# Patient Record
Sex: Female | Born: 1965
Health system: Southern US, Community
[De-identification: ages and names within clinical notes are randomized; demographics above are authoritative.]

## PROBLEM LIST (undated history)

## (undated) DIAGNOSIS — G4700 Insomnia, unspecified: Secondary | ICD-10-CM

## (undated) DIAGNOSIS — I509 Heart failure, unspecified: Secondary | ICD-10-CM

## (undated) DIAGNOSIS — M419 Scoliosis, unspecified: Secondary | ICD-10-CM

## (undated) DIAGNOSIS — F32A Depression, unspecified: Secondary | ICD-10-CM

## (undated) DIAGNOSIS — F419 Anxiety disorder, unspecified: Secondary | ICD-10-CM

## (undated) DIAGNOSIS — E785 Hyperlipidemia, unspecified: Secondary | ICD-10-CM

## (undated) DIAGNOSIS — E039 Hypothyroidism, unspecified: Secondary | ICD-10-CM

## (undated) HISTORY — PX: CARDIAC CATHETERIZATION: SHX172

## (undated) HISTORY — PX: ABDOMINAL HYSTERECTOMY: SHX81

## (undated) HISTORY — PX: PARTIAL HYSTERECTOMY: SHX80

## (undated) HISTORY — PX: CHOLECYSTECTOMY: SHX55

---

## 2020-02-05 ENCOUNTER — Emergency Department (HOSPITAL_COMMUNITY): Payer: BC Managed Care – PPO

## 2020-02-05 ENCOUNTER — Inpatient Hospital Stay (HOSPITAL_COMMUNITY)
Admission: EM | Disposition: A | Discharge status: Home / Self Care | Payer: Self-pay | Source: Home / Self Care | Attending: Pulmonary Disease

## 2020-02-05 ENCOUNTER — Inpatient Hospital Stay (HOSPITAL_COMMUNITY)
Admission: EM | Admit: 2020-02-05 | Discharge: 2020-02-15 | DRG: 224 | Disposition: A | Discharge status: Home / Self Care | Payer: BC Managed Care – PPO | Attending: Family Medicine | Admitting: Family Medicine

## 2020-02-05 ENCOUNTER — Encounter (HOSPITAL_COMMUNITY): Payer: Self-pay | Admitting: Pulmonary Disease

## 2020-02-05 DIAGNOSIS — K219 Gastro-esophageal reflux disease without esophagitis: Secondary | ICD-10-CM | POA: Diagnosis present

## 2020-02-05 DIAGNOSIS — I4901 Ventricular fibrillation: Secondary | ICD-10-CM | POA: Diagnosis present

## 2020-02-05 DIAGNOSIS — E785 Hyperlipidemia, unspecified: Secondary | ICD-10-CM | POA: Diagnosis present

## 2020-02-05 DIAGNOSIS — F419 Anxiety disorder, unspecified: Secondary | ICD-10-CM | POA: Diagnosis present

## 2020-02-05 DIAGNOSIS — H9190 Unspecified hearing loss, unspecified ear: Secondary | ICD-10-CM | POA: Diagnosis present

## 2020-02-05 DIAGNOSIS — I428 Other cardiomyopathies: Secondary | ICD-10-CM | POA: Diagnosis not present

## 2020-02-05 DIAGNOSIS — Z20822 Contact with and (suspected) exposure to covid-19: Secondary | ICD-10-CM | POA: Diagnosis present

## 2020-02-05 DIAGNOSIS — E039 Hypothyroidism, unspecified: Secondary | ICD-10-CM | POA: Diagnosis present

## 2020-02-05 DIAGNOSIS — D649 Anemia, unspecified: Secondary | ICD-10-CM | POA: Diagnosis present

## 2020-02-05 DIAGNOSIS — R570 Cardiogenic shock: Secondary | ICD-10-CM | POA: Diagnosis not present

## 2020-02-05 DIAGNOSIS — I493 Ventricular premature depolarization: Secondary | ICD-10-CM | POA: Diagnosis present

## 2020-02-05 DIAGNOSIS — I5021 Acute systolic (congestive) heart failure: Secondary | ICD-10-CM

## 2020-02-05 DIAGNOSIS — I214 Non-ST elevation (NSTEMI) myocardial infarction: Secondary | ICD-10-CM

## 2020-02-05 DIAGNOSIS — J9690 Respiratory failure, unspecified, unspecified whether with hypoxia or hypercapnia: Secondary | ICD-10-CM

## 2020-02-05 DIAGNOSIS — I509 Heart failure, unspecified: Secondary | ICD-10-CM

## 2020-02-05 DIAGNOSIS — Z23 Encounter for immunization: Secondary | ICD-10-CM

## 2020-02-05 DIAGNOSIS — I499 Cardiac arrhythmia, unspecified: Secondary | ICD-10-CM | POA: Diagnosis present

## 2020-02-05 DIAGNOSIS — I5181 Takotsubo syndrome: Secondary | ICD-10-CM | POA: Diagnosis not present

## 2020-02-05 DIAGNOSIS — G4700 Insomnia, unspecified: Secondary | ICD-10-CM | POA: Diagnosis present

## 2020-02-05 DIAGNOSIS — E872 Acidosis: Secondary | ICD-10-CM | POA: Diagnosis present

## 2020-02-05 DIAGNOSIS — I469 Cardiac arrest, cause unspecified: Secondary | ICD-10-CM | POA: Diagnosis present

## 2020-02-05 DIAGNOSIS — F32A Depression, unspecified: Secondary | ICD-10-CM | POA: Diagnosis present

## 2020-02-05 DIAGNOSIS — I472 Ventricular tachycardia: Secondary | ICD-10-CM | POA: Diagnosis not present

## 2020-02-05 DIAGNOSIS — Z79899 Other long term (current) drug therapy: Secondary | ICD-10-CM | POA: Diagnosis not present

## 2020-02-05 DIAGNOSIS — Z955 Presence of coronary angioplasty implant and graft: Secondary | ICD-10-CM

## 2020-02-05 DIAGNOSIS — J9601 Acute respiratory failure with hypoxia: Secondary | ICD-10-CM | POA: Diagnosis present

## 2020-02-05 DIAGNOSIS — I462 Cardiac arrest due to underlying cardiac condition: Secondary | ICD-10-CM | POA: Diagnosis present

## 2020-02-05 DIAGNOSIS — Z820 Family history of epilepsy and other diseases of the nervous system: Secondary | ICD-10-CM | POA: Diagnosis not present

## 2020-02-05 DIAGNOSIS — D696 Thrombocytopenia, unspecified: Secondary | ICD-10-CM | POA: Diagnosis present

## 2020-02-05 DIAGNOSIS — J189 Pneumonia, unspecified organism: Secondary | ICD-10-CM | POA: Diagnosis present

## 2020-02-05 DIAGNOSIS — Z95810 Presence of automatic (implantable) cardiac defibrillator: Secondary | ICD-10-CM

## 2020-02-05 DIAGNOSIS — G9349 Other encephalopathy: Secondary | ICD-10-CM | POA: Diagnosis present

## 2020-02-05 DIAGNOSIS — R1310 Dysphagia, unspecified: Secondary | ICD-10-CM | POA: Diagnosis present

## 2020-02-05 HISTORY — PX: TEMPORARY PACEMAKER: CATH118268

## 2020-02-05 HISTORY — DX: Hypothyroidism, unspecified: E03.9

## 2020-02-05 HISTORY — DX: Anxiety disorder, unspecified: F41.9

## 2020-02-05 HISTORY — DX: Hyperlipidemia, unspecified: E78.5

## 2020-02-05 HISTORY — PX: LEFT HEART CATH AND CORONARY ANGIOGRAPHY: CATH118249

## 2020-02-05 HISTORY — DX: Depression, unspecified: F32.A

## 2020-02-05 HISTORY — DX: Insomnia, unspecified: G47.00

## 2020-02-05 LAB — URINALYSIS, ROUTINE W REFLEX MICROSCOPIC
Bilirubin Urine: NEGATIVE
Glucose, UA: 50 mg/dL — AB
Ketones, ur: NEGATIVE mg/dL
Leukocytes,Ua: NEGATIVE
Nitrite: NEGATIVE
Protein, ur: >=300 mg/dL — AB
Specific Gravity, Urine: 1.013 (ref 1.005–1.030)
pH: 6 (ref 5.0–8.0)

## 2020-02-05 LAB — I-STAT ARTERIAL BLOOD GAS, ED
Acid-base deficit: 1 mmol/L (ref 0.0–2.0)
Bicarbonate: 25.7 mmol/L (ref 20.0–28.0)
Calcium, Ion: 1.17 mmol/L (ref 1.15–1.40)
HCT: 35 % — ABNORMAL LOW (ref 36.0–46.0)
Hemoglobin: 11.9 g/dL — ABNORMAL LOW (ref 12.0–15.0)
O2 Saturation: 100 %
Patient temperature: 98.7
Potassium: 3.7 mmol/L (ref 3.5–5.1)
Sodium: 141 mmol/L (ref 135–145)
TCO2: 27 mmol/L (ref 22–32)
pCO2 arterial: 52.4 mmHg — ABNORMAL HIGH (ref 32.0–48.0)
pH, Arterial: 7.298 — ABNORMAL LOW (ref 7.350–7.450)
pO2, Arterial: 255 mmHg — ABNORMAL HIGH (ref 83.0–108.0)

## 2020-02-05 LAB — PROTIME-INR
INR: 1 (ref 0.8–1.2)
Prothrombin Time: 12.9 seconds (ref 11.4–15.2)

## 2020-02-05 LAB — I-STAT CHEM 8, ED
BUN: 22 mg/dL — ABNORMAL HIGH (ref 6–20)
Calcium, Ion: 1.01 mmol/L — ABNORMAL LOW (ref 1.15–1.40)
Chloride: 105 mmol/L (ref 98–111)
Creatinine, Ser: 1.2 mg/dL — ABNORMAL HIGH (ref 0.44–1.00)
Glucose, Bld: 185 mg/dL — ABNORMAL HIGH (ref 70–99)
HCT: 41 % (ref 36.0–46.0)
Hemoglobin: 13.9 g/dL (ref 12.0–15.0)
Potassium: 3.8 mmol/L (ref 3.5–5.1)
Sodium: 140 mmol/L (ref 135–145)
TCO2: 22 mmol/L (ref 22–32)

## 2020-02-05 LAB — BASIC METABOLIC PANEL
Anion gap: 11 (ref 5–15)
Anion gap: 8 (ref 5–15)
BUN: 18 mg/dL (ref 6–20)
BUN: 16 mg/dL (ref 6–20)
CO2: 21 mmol/L — ABNORMAL LOW (ref 22–32)
CO2: 19 mmol/L — ABNORMAL LOW (ref 22–32)
Calcium: 8.7 mg/dL — ABNORMAL LOW (ref 8.9–10.3)
Calcium: 6.3 mg/dL — CL (ref 8.9–10.3)
Chloride: 107 mmol/L (ref 98–111)
Chloride: 110 mmol/L (ref 98–111)
Creatinine, Ser: 1.18 mg/dL — ABNORMAL HIGH (ref 0.44–1.00)
Creatinine, Ser: 0.83 mg/dL (ref 0.44–1.00)
GFR, Estimated: 56 mL/min — ABNORMAL LOW (ref >60)
GFR, Estimated: >60 mL/min (ref >60)
Glucose, Bld: 182 mg/dL — ABNORMAL HIGH (ref 70–99)
Glucose, Bld: 208 mg/dL — ABNORMAL HIGH (ref 70–99)
Potassium: 3.9 mmol/L (ref 3.5–5.1)
Potassium: 3.9 mmol/L (ref 3.5–5.1)
Sodium: 139 mmol/L (ref 135–145)
Sodium: 137 mmol/L (ref 135–145)

## 2020-02-05 LAB — I-STAT VENOUS BLOOD GAS, ED
Acid-base deficit: 3 mmol/L — ABNORMAL HIGH (ref 0.0–2.0)
Bicarbonate: 23.1 mmol/L (ref 20.0–28.0)
Calcium, Ion: 1.04 mmol/L — ABNORMAL LOW (ref 1.15–1.40)
HCT: 41 % (ref 36.0–46.0)
Hemoglobin: 13.9 g/dL (ref 12.0–15.0)
O2 Saturation: 94 %
Potassium: 3.8 mmol/L (ref 3.5–5.1)
Sodium: 139 mmol/L (ref 135–145)
TCO2: 24 mmol/L (ref 22–32)
pCO2, Ven: 42 mmHg — ABNORMAL LOW (ref 44.0–60.0)
pH, Ven: 7.348 (ref 7.250–7.430)
pO2, Ven: 74 mmHg — ABNORMAL HIGH (ref 32.0–45.0)

## 2020-02-05 LAB — COMPREHENSIVE METABOLIC PANEL
ALT: 482 U/L — ABNORMAL HIGH (ref 0–44)
AST: 898 U/L — ABNORMAL HIGH (ref 15–41)
Albumin: 3.1 g/dL — ABNORMAL LOW (ref 3.5–5.0)
Alkaline Phosphatase: 75 U/L (ref 38–126)
Anion gap: 6 (ref 5–15)
BUN: 16 mg/dL (ref 6–20)
CO2: 22 mmol/L (ref 22–32)
Calcium: 7 mg/dL — ABNORMAL LOW (ref 8.9–10.3)
Chloride: 109 mmol/L (ref 98–111)
Creatinine, Ser: 0.94 mg/dL (ref 0.44–1.00)
GFR, Estimated: >60 mL/min (ref >60)
Glucose, Bld: 199 mg/dL — ABNORMAL HIGH (ref 70–99)
Potassium: 4.9 mmol/L (ref 3.5–5.1)
Sodium: 137 mmol/L (ref 135–145)
Total Bilirubin: 0.8 mg/dL (ref 0.3–1.2)
Total Protein: 5.2 g/dL — ABNORMAL LOW (ref 6.5–8.1)

## 2020-02-05 LAB — RESP PANEL BY RT PCR (RSV, FLU A&B, COVID)
Influenza A by PCR: NEGATIVE
Influenza B by PCR: NEGATIVE
Respiratory Syncytial Virus by PCR: NEGATIVE
SARS Coronavirus 2 by RT PCR: NEGATIVE

## 2020-02-05 LAB — RAPID URINE DRUG SCREEN, HOSP PERFORMED
Amphetamines: POSITIVE — AB
Barbiturates: NOT DETECTED
Benzodiazepines: POSITIVE — AB
Cocaine: NOT DETECTED
Opiates: NOT DETECTED
Tetrahydrocannabinol: NOT DETECTED

## 2020-02-05 LAB — CBC
HCT: 42.7 % (ref 36.0–46.0)
HCT: 39.7 % (ref 36.0–46.0)
Hemoglobin: 13.3 g/dL (ref 12.0–15.0)
Hemoglobin: 12.5 g/dL (ref 12.0–15.0)
MCH: 30.9 pg (ref 26.0–34.0)
MCH: 31.8 pg (ref 26.0–34.0)
MCHC: 31.1 g/dL (ref 30.0–36.0)
MCHC: 31.5 g/dL (ref 30.0–36.0)
MCV: 99.3 fL (ref 80.0–100.0)
MCV: 101 fL — ABNORMAL HIGH (ref 80.0–100.0)
Platelets: 180 10*3/uL (ref 150–400)
Platelets: 203 10*3/uL (ref 150–400)
RBC: 4.3 MIL/uL (ref 3.87–5.11)
RBC: 3.93 MIL/uL (ref 3.87–5.11)
RDW: 13.5 % (ref 11.5–15.5)
RDW: 13.6 % (ref 11.5–15.5)
WBC: 8.1 10*3/uL (ref 4.0–10.5)
WBC: 8.7 10*3/uL (ref 4.0–10.5)
nRBC: 0 % (ref 0.0–0.2)
nRBC: 0 % (ref 0.0–0.2)

## 2020-02-05 LAB — TSH: TSH: 7.724 u[IU]/mL — ABNORMAL HIGH (ref 0.350–4.500)

## 2020-02-05 LAB — I-STAT BETA HCG BLOOD, ED (MC, WL, AP ONLY): I-stat hCG, quantitative: 7.5 m[IU]/mL — ABNORMAL HIGH (ref <5)

## 2020-02-05 LAB — LACTIC ACID, PLASMA
Lactic Acid, Venous: 3.8 mmol/L (ref 0.5–1.9)
Lactic Acid, Venous: 2.4 mmol/L (ref 0.5–1.9)
Lactic Acid, Venous: 1.3 mmol/L (ref 0.5–1.9)

## 2020-02-05 LAB — CBG MONITORING, ED: Glucose-Capillary: 184 mg/dL — ABNORMAL HIGH (ref 70–99)

## 2020-02-05 LAB — TROPONIN I (HIGH SENSITIVITY)
Troponin I (High Sensitivity): 33 ng/L — ABNORMAL HIGH (ref <18)
Troponin I (High Sensitivity): 88 ng/L — ABNORMAL HIGH (ref <18)
Troponin I (High Sensitivity): 119 ng/L (ref <18)
Troponin I (High Sensitivity): 232 ng/L (ref <18)

## 2020-02-05 LAB — HIV ANTIBODY (ROUTINE TESTING W REFLEX): HIV Screen 4th Generation wRfx: NONREACTIVE

## 2020-02-05 LAB — TRIGLYCERIDES: Triglycerides: 59 mg/dL (ref <150)

## 2020-02-05 LAB — D-DIMER, QUANTITATIVE: D-Dimer, Quant: >20 ug/mL-FEU — ABNORMAL HIGH (ref 0.00–0.50)

## 2020-02-05 LAB — MAGNESIUM
Magnesium: 2 mg/dL (ref 1.7–2.4)
Magnesium: 4.4 mg/dL — ABNORMAL HIGH (ref 1.7–2.4)
Magnesium: 3.3 mg/dL — ABNORMAL HIGH (ref 1.7–2.4)

## 2020-02-05 LAB — ETHANOL: Alcohol, Ethyl (B): <10 mg/dL (ref <10)

## 2020-02-05 IMAGING — DX DG CHEST 1V PORT
1 series · 1 of 1 positions shown · non-contrast
Comparison: [DATE] [DATE], [DATE] ([DATE] p.m.)

CLINICAL DATA: Status post CPR.

EXAM:
PORTABLE CHEST 1 VIEW

[chest]
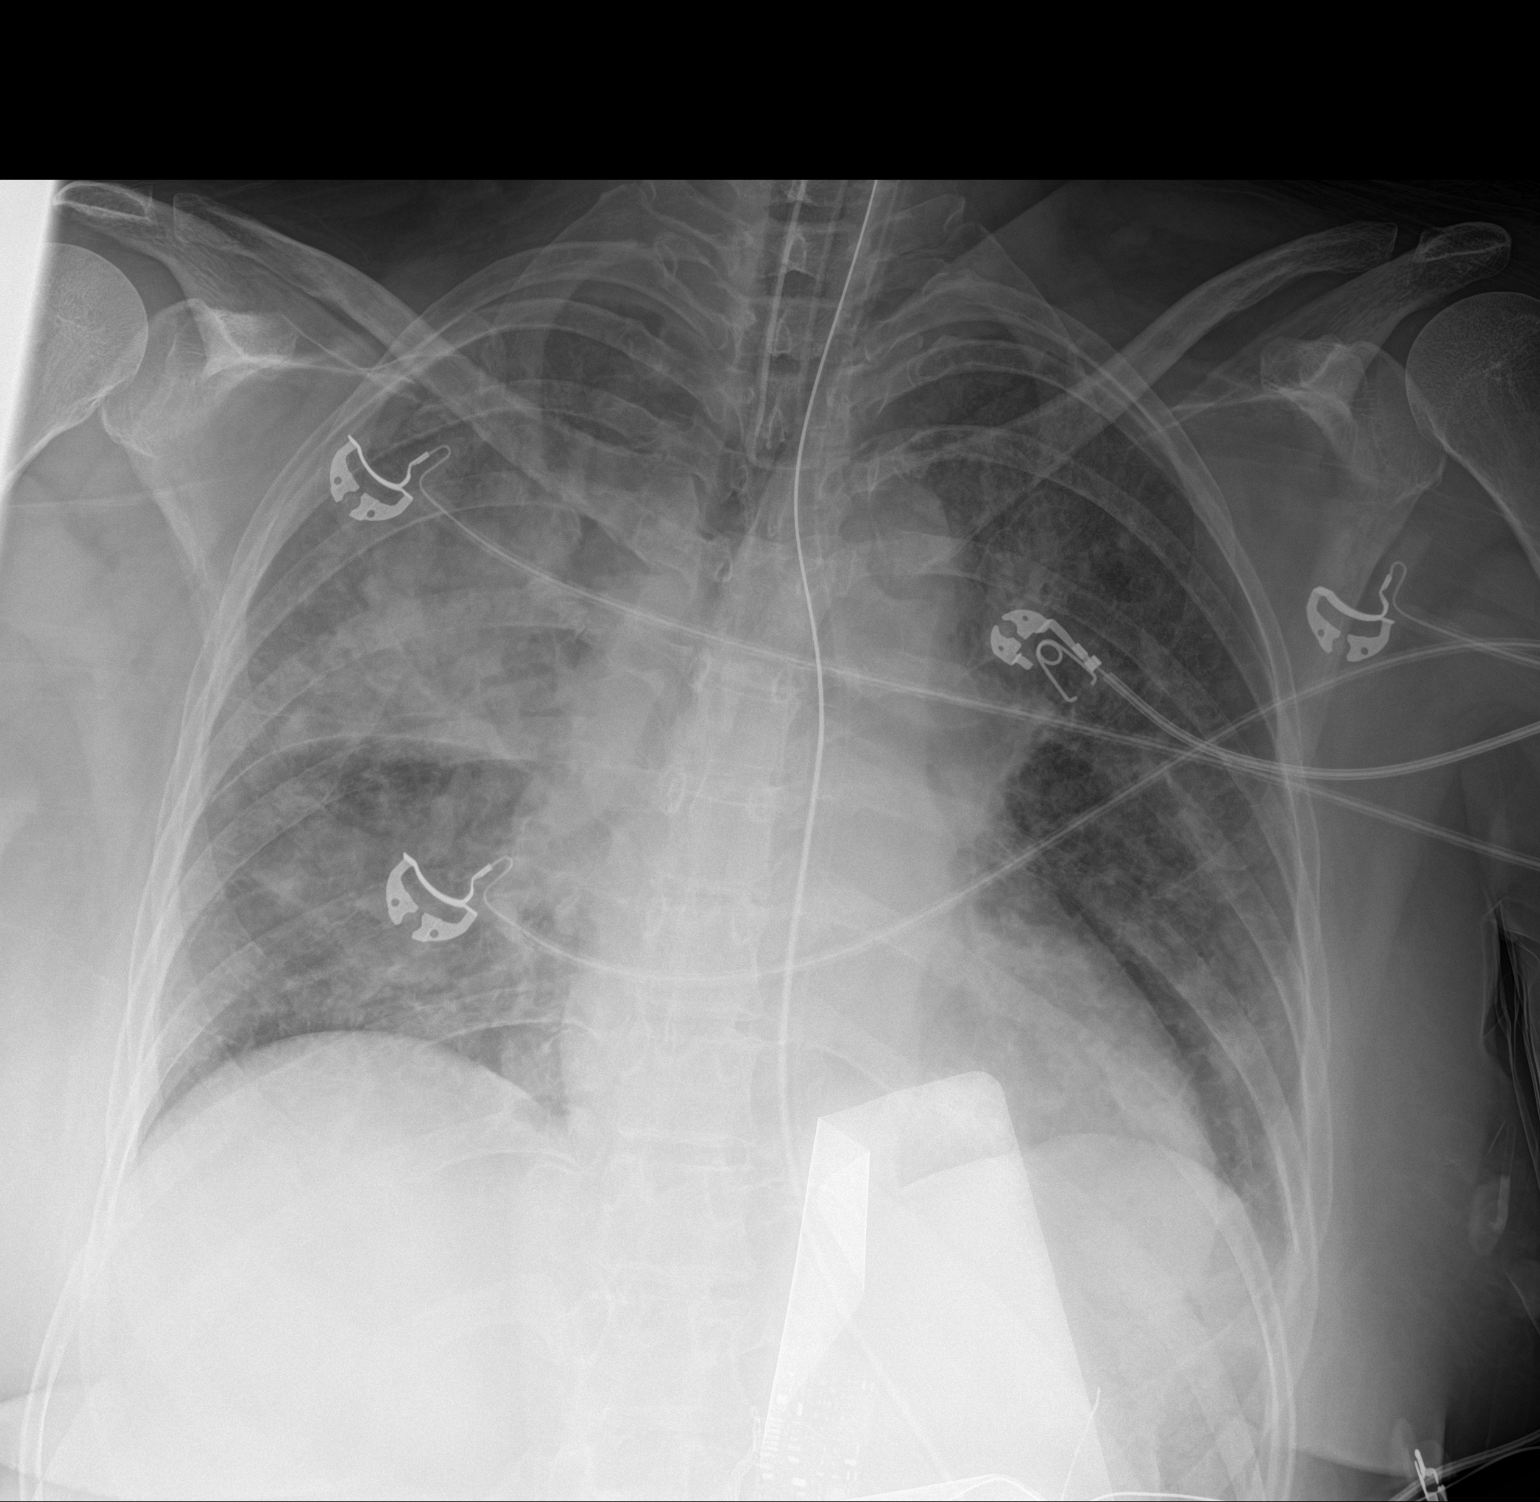

[1 of 1 positions shown; findings below may reference images not displayed]

FINDINGS: There is stable endotracheal tube and nasogastric tube positioning.
Mild diffusely increased interstitial lung markings are seen with
moderate to marked severity patchy left suprahilar and right upper
lobe infiltrates. Mild bilateral lower lobe infiltrates are also
seen. These areas are mildly increased in severity when compared to
the prior study. The heart size and mediastinal contours are within
normal limits. The visualized skeletal structures are unremarkable.
IMPRESSION: Moderate to marked severity bilateral patchy infiltrates, as
described above, with mild interval increase in severity when
compared to the prior exam.

## 2020-02-05 IMAGING — CT CT HEAD W/O CM
4 series · 17 of 47 positions shown, 19 images · non-contrast
Comparison: None.

CLINICAL DATA: 53-year-old female with altered mental status post
cardiac arrest.

EXAM:
CT HEAD WITHOUT CONTRAST
TECHNIQUE: Contiguous axial images were obtained from the base of the skull
through the vertex without intravenous contrast.

[Series 3: head wo · axial · 0.41mm/px · z∈[-154,-34]mm · 7 of 33 slices shown, 9 images]
[im 5/33  brain]
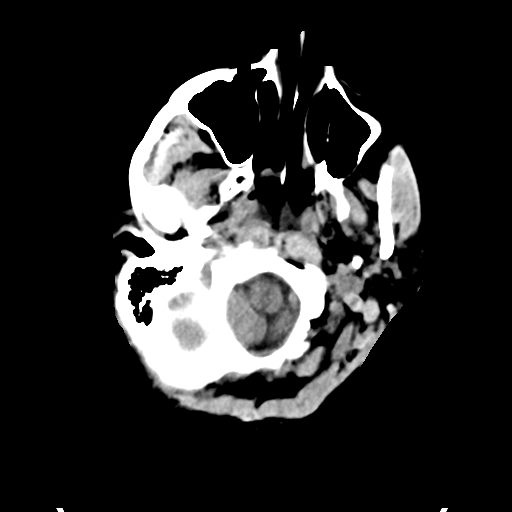
[im 5/33  bone]
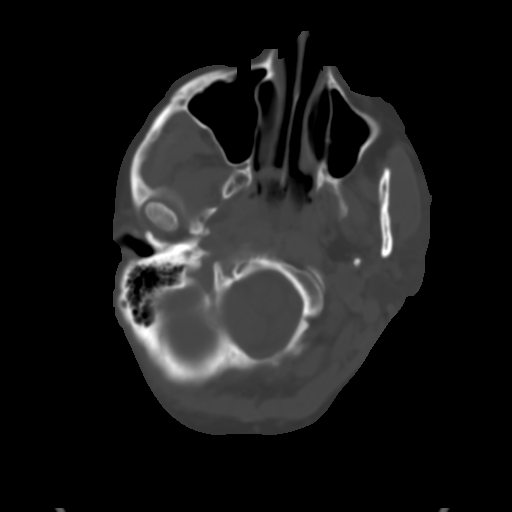
[im 9/33  brain]
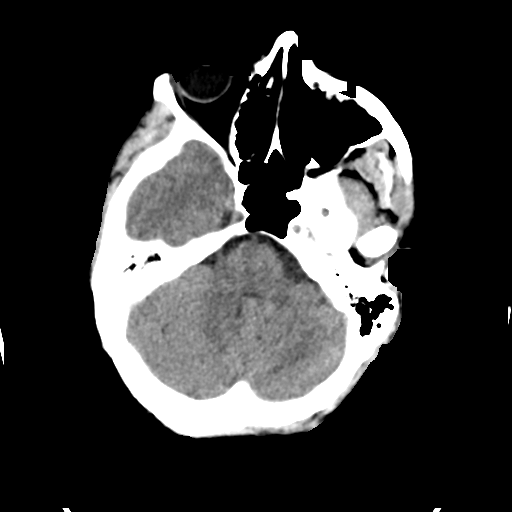
[im 13/33  brain]
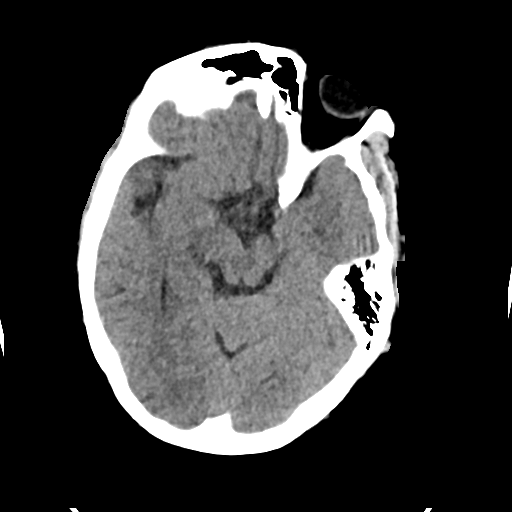
[im 17/33  brain]
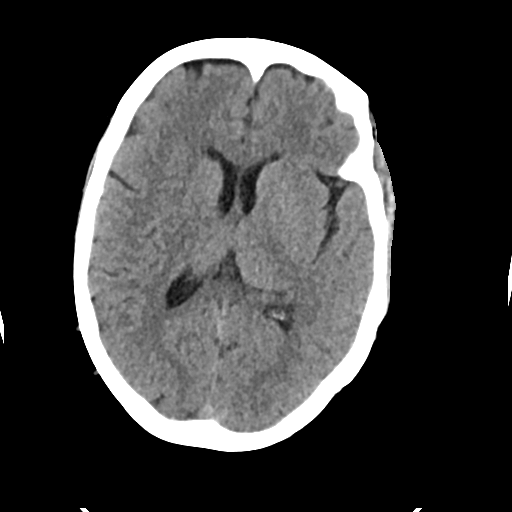
[im 21/33  brain]
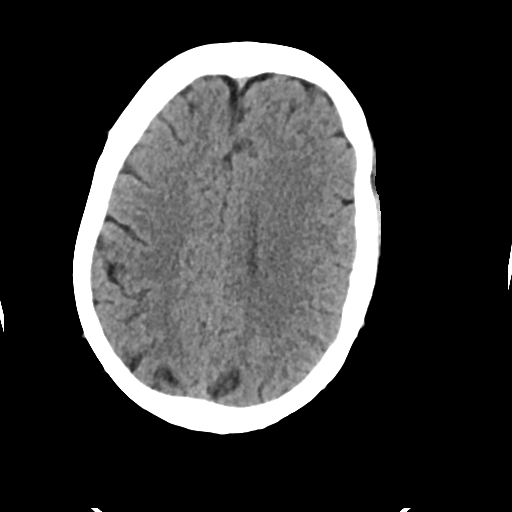
[im 21/33  bone]
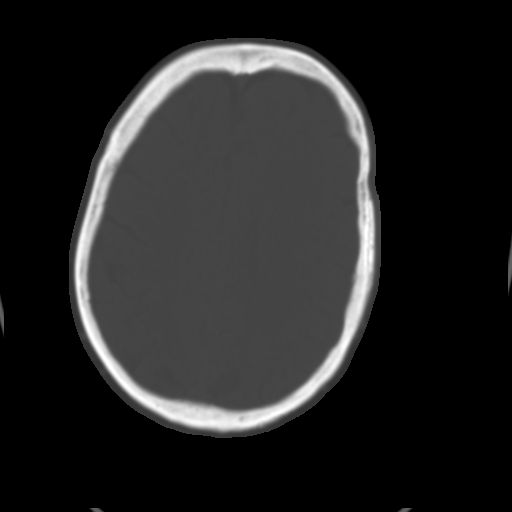
[im 25/33  brain]
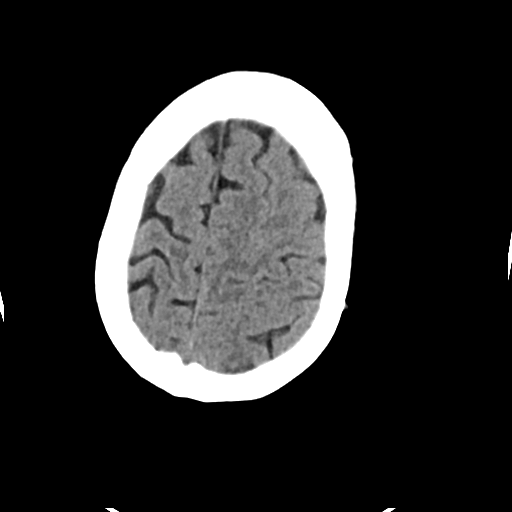
[im 29/33  brain]
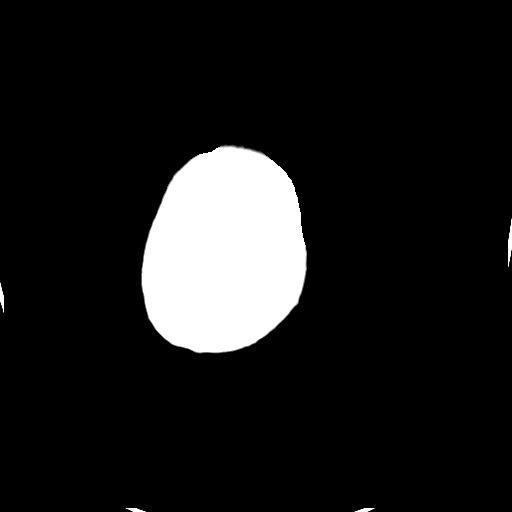

[Series 4: head bone · axial · 0.41mm/px · z∈[-158,-102]mm · 4 of 82 slices shown]
[im 9/82  bone]
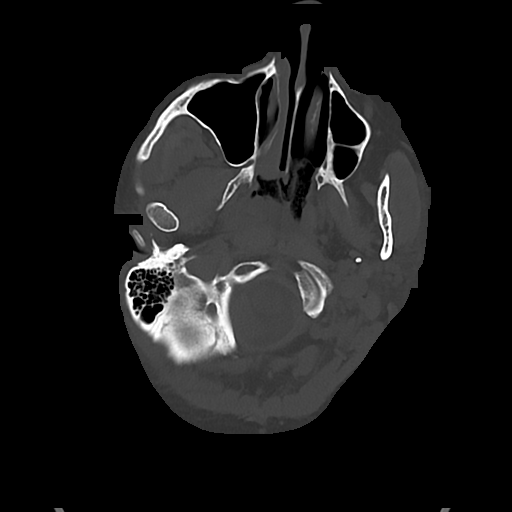
[im 17/82  bone]
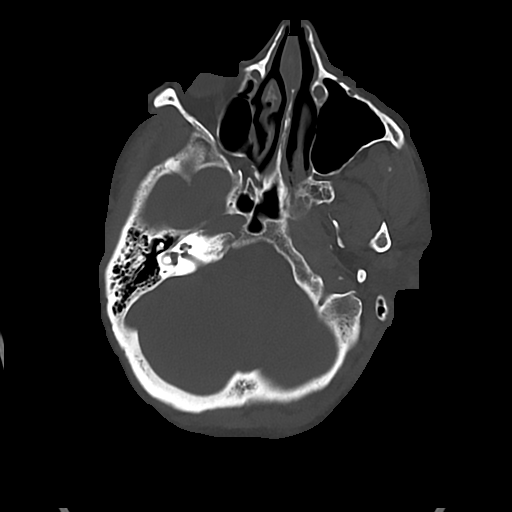
[im 25/82  bone]
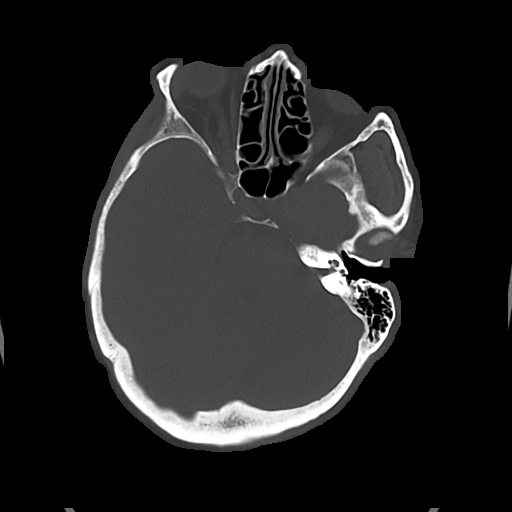
[im 37/82  bone]
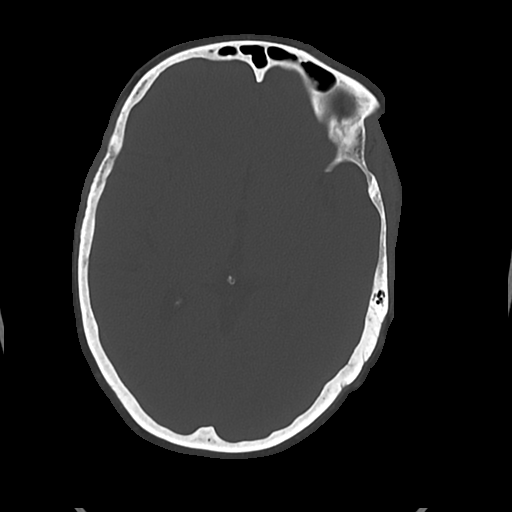

[Series 5: cor soft · coronal · 0.32mm/px · 3 of 67 slices shown]
[im 23/67  brain]
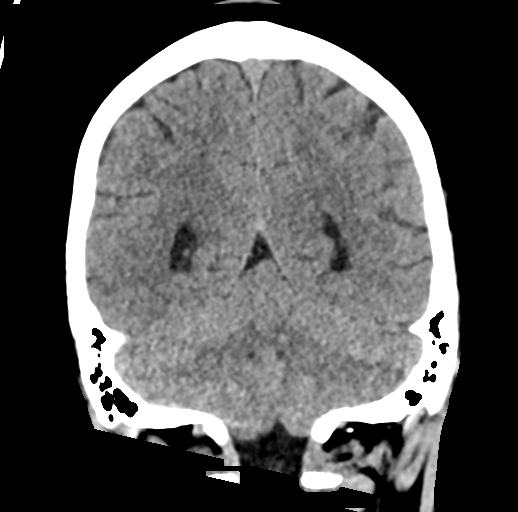
[im 30/67  brain]
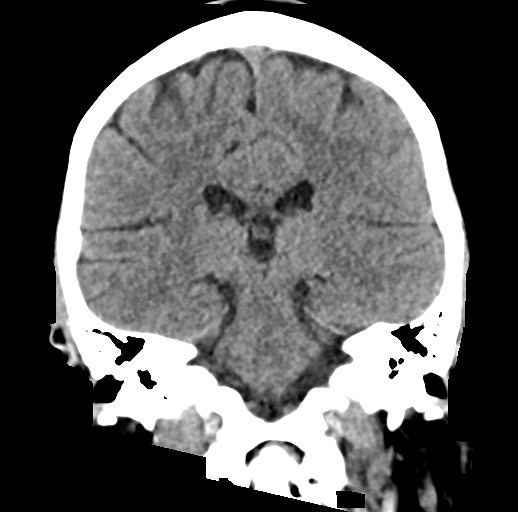
[im 37/67  brain]
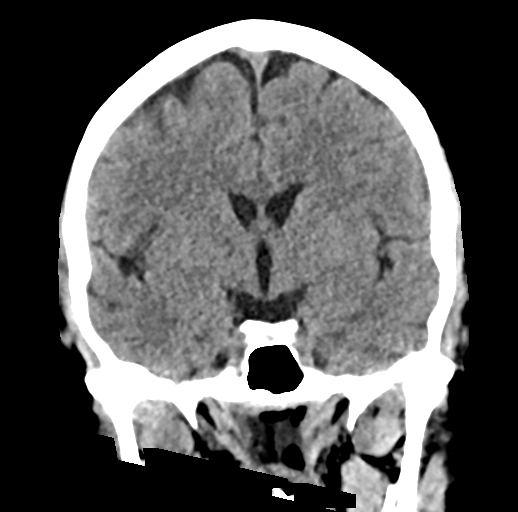

[Series 6: sag soft · sagittal · 0.32mm/px · 3 of 53 slices shown]
[im 23/53  brain]
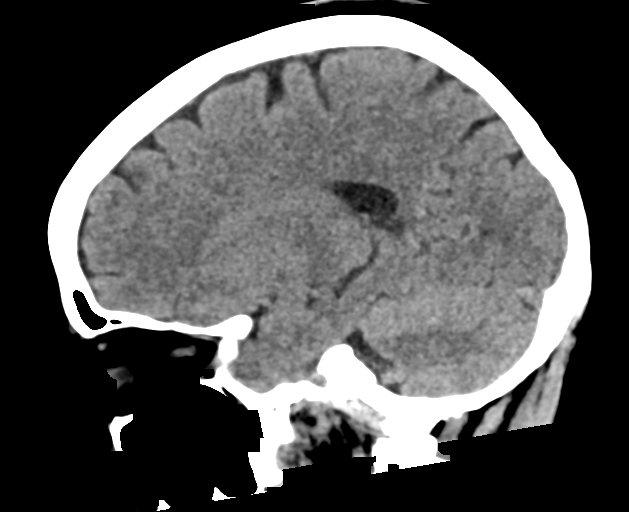
[im 28/53  brain]
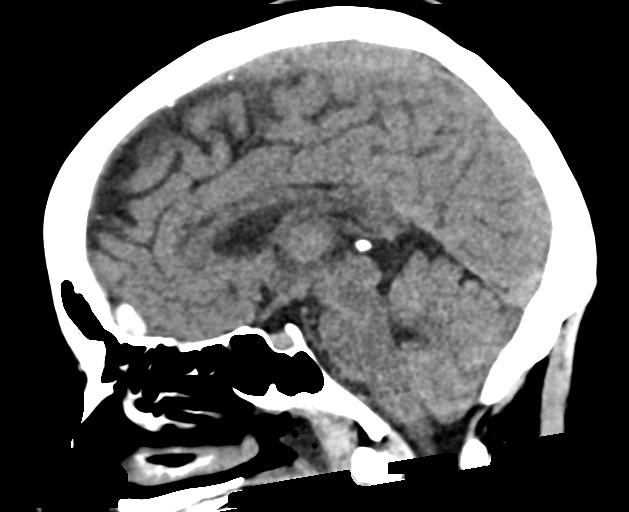
[im 32/53  brain]
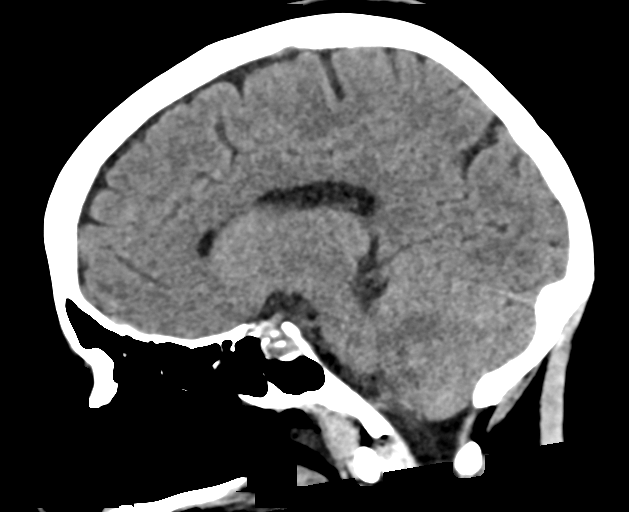

[17 of 47 positions shown; findings below may reference images not displayed]

FINDINGS: Brain: No evidence of acute infarction, hemorrhage, hydrocephalus,
extra-axial collection or mass lesion/mass effect.

Vascular: No hyperdense vessel or unexpected calcification.

Skull: Normal. Negative for fracture or focal lesion.

Sinuses/Orbits: No acute finding.

Other: Oral intubation noted.
IMPRESSION: No evidence of acute intracranial abnormality.

## 2020-02-05 IMAGING — DX DG CHEST 1V PORT
1 series · 1 of 1 positions shown · non-contrast
Comparison: None

CLINICAL DATA: Post intubation, witnessed cardiac arrest and CPR

EXAM:
PORTABLE CHEST 1 VIEW

[chest ap]
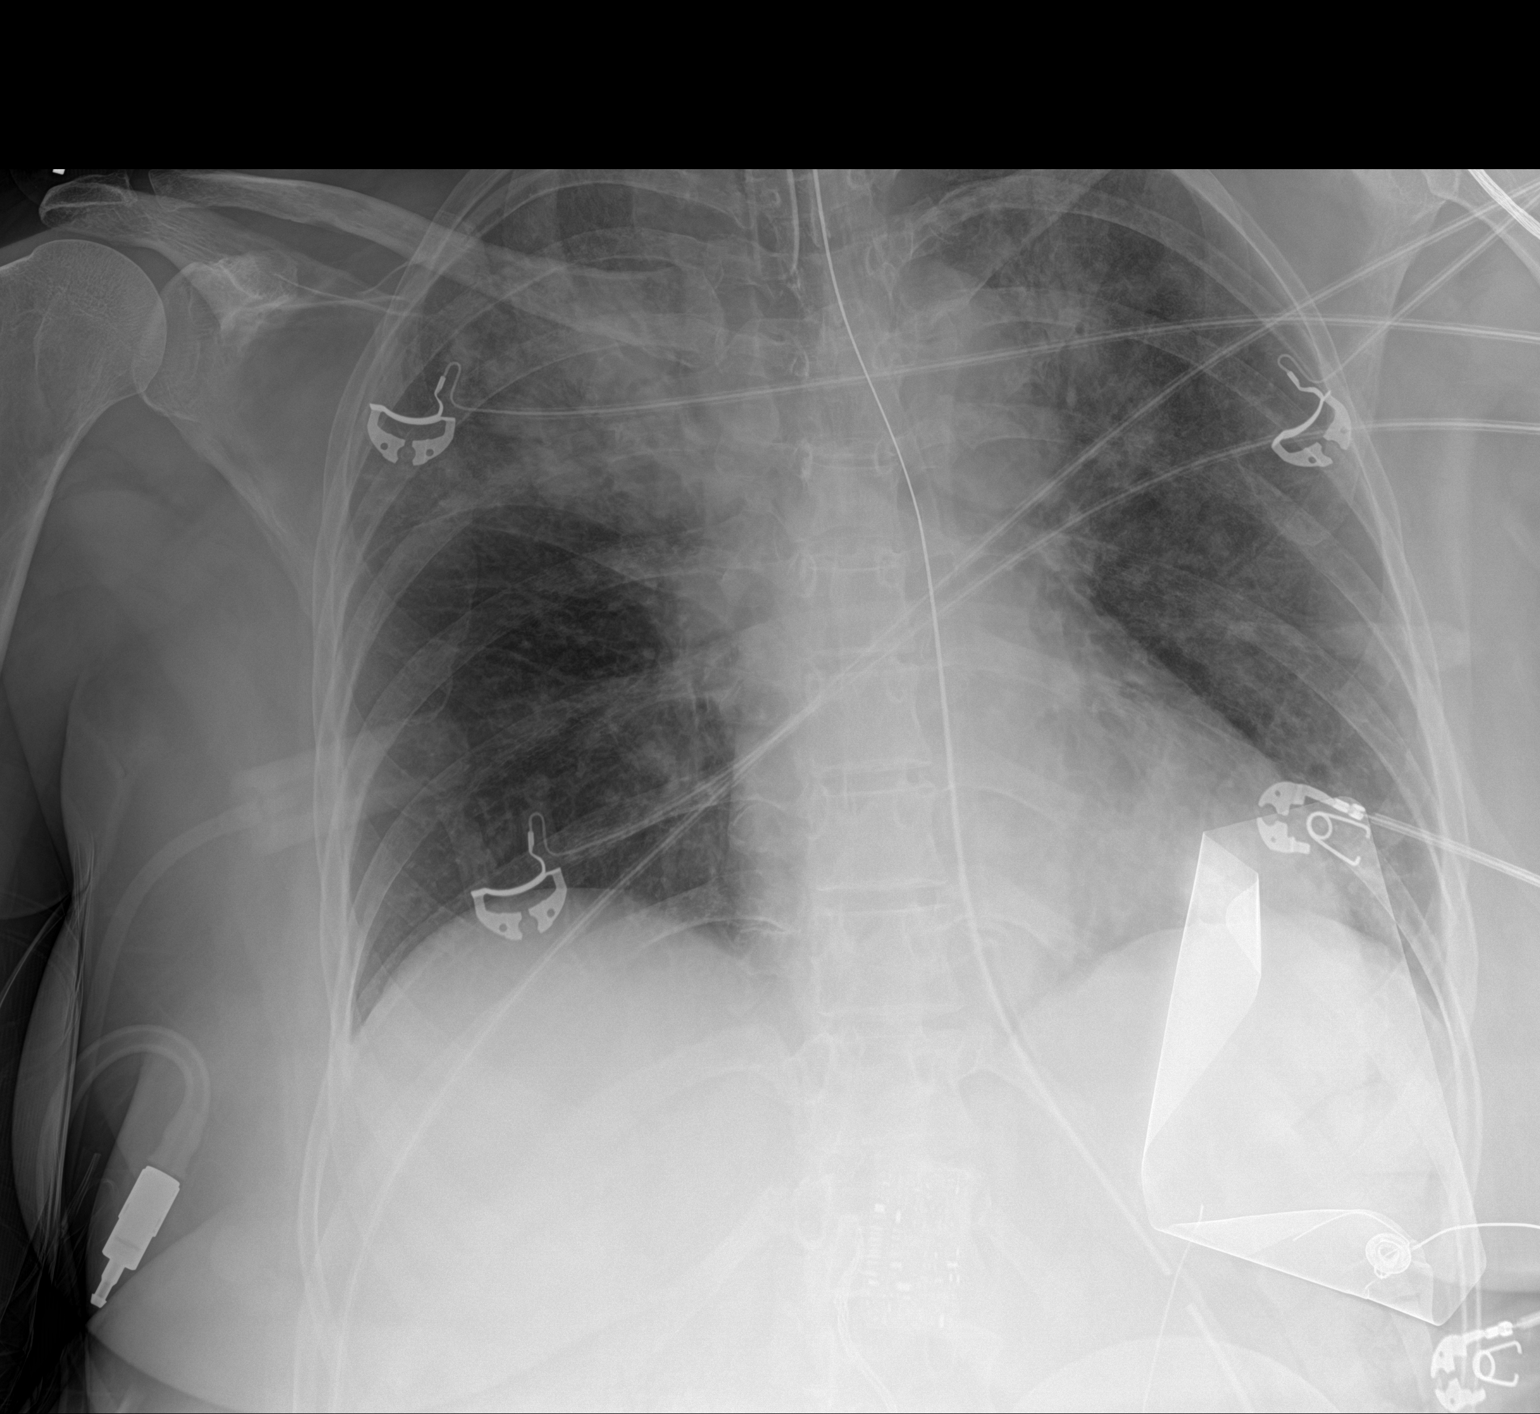

[1 of 1 positions shown; findings below may reference images not displayed]

FINDINGS: Endotracheal tube terminates between the clavicles approximately 4
cm above the carina.

Gastric tube courses through in a off the field of the radiograph,
side port in the region of the mid stomach, tip off the field of
view.

Pacer defibrillator pads project over the patient.

Upper lobe opacities are present with some elevation of the minor
fissure in the RIGHT chest.

Less opacity at the LEFT lung apex.

On limited assessment no acute skeletal process.
IMPRESSION: 1. Endotracheal tube terminates between the clavicles approximately
4 cm above the carina.
2. Suggested volume loss and consolidative changes in the RIGHT
upper lobe greater than LEFT upper lobe. Findings could be related
to volume loss with pneumonitis from aspiration and potential
background of asymmetric pulmonary edema or pneumonia. Underlying
mass would be difficult to exclude. Follow-up after resolution of
acute symptoms is suggested to exclude underlying lesion.
3. Gastric tube courses through a off the field of the radiograph,
side port in the region of the mid stomach, tip off the field of
view.

## 2020-02-05 SURGERY — LEFT HEART CATH AND CORONARY ANGIOGRAPHY
Anesthesia: LOCAL

## 2020-02-05 MED ORDER — POTASSIUM CHLORIDE 10 MEQ/100ML IV SOLN
10.0000 meq | INTRAVENOUS | Status: AC
  Administered 2020-02-05: 10 meq via INTRAVENOUS
  Filled 2020-02-05: qty 100

## 2020-02-05 MED ORDER — SODIUM CHLORIDE 0.9 % IV SOLN
250.0000 mL | INTRAVENOUS | Status: DC
  Administered 2020-02-05: 250 mL via INTRAVENOUS
  Administered 2020-02-06: 1000 mL via INTRAVENOUS
  Administered 2020-02-09: 250 mL via INTRAVENOUS

## 2020-02-05 MED ORDER — NOREPINEPHRINE 4 MG/250ML-% IV SOLN: 0.0000 ug/min | INTRAVENOUS | Status: DC

## 2020-02-05 MED ORDER — CALCIUM GLUCONATE-NACL 1-0.675 GM/50ML-% IV SOLN
1.0000 g | Freq: Once | INTRAVENOUS | Status: AC
  Administered 2020-02-05: 1000 mg via INTRAVENOUS
  Filled 2020-02-05: qty 50

## 2020-02-05 MED ORDER — FENTANYL CITRATE (PF) 100 MCG/2ML IJ SOLN
100.0000 ug | INTRAMUSCULAR | Status: DC | PRN
  Filled 2020-02-05: qty 2

## 2020-02-05 MED ORDER — MAGNESIUM SULFATE 50 % IJ SOLN
INTRAMUSCULAR | Status: AC | PRN
  Administered 2020-02-05: 2 g via INTRAVENOUS

## 2020-02-05 MED ORDER — FUROSEMIDE 10 MG/ML IJ SOLN
INTRAMUSCULAR | Status: DC | PRN
  Administered 2020-02-05: 40 mg via INTRAVENOUS

## 2020-02-05 MED ORDER — DOCUSATE SODIUM 100 MG PO CAPS: 100.0000 mg | ORAL_CAPSULE | Freq: Two times a day (BID) | ORAL | Status: DC | PRN

## 2020-02-05 MED ORDER — AMIODARONE HCL IN DEXTROSE 360-4.14 MG/200ML-% IV SOLN
60.0000 mg/h | INTRAVENOUS | Status: DC
  Administered 2020-02-05: 60 mg/h via INTRAVENOUS

## 2020-02-05 MED ORDER — FENTANYL CITRATE (PF) 100 MCG/2ML IJ SOLN: 100.0000 ug | INTRAMUSCULAR | Status: DC | PRN

## 2020-02-05 MED ORDER — DOCUSATE SODIUM 50 MG/5ML PO LIQD
100.0000 mg | Freq: Two times a day (BID) | ORAL | Status: DC
  Administered 2020-02-06 – 2020-02-09 (×6): 100 mg
  Filled 2020-02-05 (×6): qty 10

## 2020-02-05 MED ORDER — FUROSEMIDE 10 MG/ML IJ SOLN
INTRAMUSCULAR | Status: AC
  Filled 2020-02-05: qty 4

## 2020-02-05 MED ORDER — FENTANYL CITRATE (PF) 100 MCG/2ML IJ SOLN
INTRAMUSCULAR | Status: DC | PRN
  Administered 2020-02-05: 50 ug via INTRAVENOUS

## 2020-02-05 MED ORDER — POLYETHYLENE GLYCOL 3350 17 G PO PACK
17.0000 g | PACK | Freq: Every day | ORAL | Status: DC
  Administered 2020-02-07 – 2020-02-09 (×3): 17 g
  Filled 2020-02-05 (×3): qty 1

## 2020-02-05 MED ORDER — FENTANYL CITRATE (PF) 100 MCG/2ML IJ SOLN
INTRAMUSCULAR | Status: AC
  Filled 2020-02-05: qty 2

## 2020-02-05 MED ORDER — POLYETHYLENE GLYCOL 3350 17 G PO PACK: 17.0000 g | PACK | Freq: Every day | ORAL | Status: DC | PRN

## 2020-02-05 MED ORDER — HEPARIN (PORCINE) IN NACL 2000-0.9 UNIT/L-% IV SOLN
INTRAVENOUS | Status: AC
  Filled 2020-02-05: qty 1000

## 2020-02-05 MED ORDER — SODIUM CHLORIDE 0.9% FLUSH
3.0000 mL | Freq: Two times a day (BID) | INTRAVENOUS | Status: DC
  Administered 2020-02-06 – 2020-02-09 (×6): 3 mL via INTRAVENOUS

## 2020-02-05 MED ORDER — HEPARIN SODIUM (PORCINE) 1000 UNIT/ML IJ SOLN
INTRAMUSCULAR | Status: AC
  Filled 2020-02-05: qty 1

## 2020-02-05 MED ORDER — LABETALOL HCL 5 MG/ML IV SOLN: 10.0000 mg | INTRAVENOUS | Status: AC | PRN

## 2020-02-05 MED ORDER — FENTANYL CITRATE (PF) 100 MCG/2ML IJ SOLN
INTRAMUSCULAR | Status: DC | PRN
Start: 2020-02-05 — End: 2020-02-05
  Administered 2020-02-05: 100 ug via INTRAVENOUS

## 2020-02-05 MED ORDER — PANTOPRAZOLE SODIUM 40 MG IV SOLR
40.0000 mg | Freq: Every day | INTRAVENOUS | Status: DC
  Administered 2020-02-05 – 2020-02-06 (×2): 40 mg via INTRAVENOUS
  Filled 2020-02-05 (×2): qty 40

## 2020-02-05 MED ORDER — MIDAZOLAM HCL 2 MG/2ML IJ SOLN
2.0000 mg | INTRAMUSCULAR | Status: AC | PRN
  Administered 2020-02-06 – 2020-02-07 (×3): 2 mg via INTRAVENOUS
  Filled 2020-02-05 (×3): qty 2

## 2020-02-05 MED ORDER — POLYETHYLENE GLYCOL 3350 17 G PO PACK: 17.0000 g | PACK | Freq: Every day | ORAL | Status: DC

## 2020-02-05 MED ORDER — FENTANYL CITRATE (PF) 100 MCG/2ML IJ SOLN
50.0000 ug | INTRAMUSCULAR | Status: AC | PRN
  Administered 2020-02-05 – 2020-02-06 (×3): 50 ug via INTRAVENOUS
  Filled 2020-02-05 (×2): qty 2

## 2020-02-05 MED ORDER — AMIODARONE HCL IN DEXTROSE 360-4.14 MG/200ML-% IV SOLN: 30.0000 mg/h | INTRAVENOUS | Status: DC

## 2020-02-05 MED ORDER — ATROPINE SULFATE 1 MG/ML IJ SOLN
INTRAMUSCULAR | Status: AC | PRN
  Administered 2020-02-05: 1 mg via INTRAVENOUS

## 2020-02-05 MED ORDER — ONDANSETRON HCL 4 MG/2ML IJ SOLN: 4.0000 mg | Freq: Four times a day (QID) | INTRAMUSCULAR | Status: DC | PRN

## 2020-02-05 MED ORDER — NOREPINEPHRINE BITARTRATE 1 MG/ML IV SOLN
INTRAVENOUS | Status: AC | PRN
  Administered 2020-02-05: 3 ug/min via INTRAVENOUS

## 2020-02-05 MED ORDER — LIDOCAINE HCL (CARDIAC) PF 100 MG/5ML IV SOSY
PREFILLED_SYRINGE | INTRAVENOUS | Status: AC | PRN
  Administered 2020-02-05: 100 mg via INTRAVENOUS

## 2020-02-05 MED ORDER — EPINEPHRINE 1 MG/10ML IJ SOSY
PREFILLED_SYRINGE | INTRAMUSCULAR | Status: AC | PRN
  Administered 2020-02-05: 1 mg via INTRAVENOUS

## 2020-02-05 MED ORDER — LIDOCAINE HCL (PF) 1 % IJ SOLN
INTRAMUSCULAR | Status: AC
  Filled 2020-02-05: qty 30

## 2020-02-05 MED ORDER — HYDRALAZINE HCL 20 MG/ML IJ SOLN: 10.0000 mg | INTRAMUSCULAR | Status: AC | PRN

## 2020-02-05 MED ORDER — VERAPAMIL HCL 2.5 MG/ML IV SOLN
INTRAVENOUS | Status: AC
  Filled 2020-02-05: qty 2

## 2020-02-05 MED ORDER — DOCUSATE SODIUM 50 MG/5ML PO LIQD: 100.0000 mg | Freq: Two times a day (BID) | ORAL | Status: DC

## 2020-02-05 MED ORDER — MIDAZOLAM HCL 2 MG/2ML IJ SOLN
INTRAMUSCULAR | Status: DC | PRN
  Administered 2020-02-05: 2 mg via INTRAVENOUS

## 2020-02-05 MED ORDER — ENOXAPARIN SODIUM 30 MG/0.3ML ~~LOC~~ SOLN: 30.0000 mg | SUBCUTANEOUS | Status: DC

## 2020-02-05 MED ORDER — NOREPINEPHRINE 4 MG/250ML-% IV SOLN
0.0000 ug/min | INTRAVENOUS | Status: DC
  Administered 2020-02-08: 1 ug/min via INTRAVENOUS
  Filled 2020-02-05 (×2): qty 250

## 2020-02-05 MED ORDER — ROCURONIUM BROMIDE 50 MG/5ML IV SOLN
INTRAVENOUS | Status: AC | PRN
  Administered 2020-02-05: 80 mg via INTRAVENOUS

## 2020-02-05 MED ORDER — SODIUM CHLORIDE 0.9 % IV SOLN: INTRAVENOUS | Status: AC

## 2020-02-05 MED ORDER — ENOXAPARIN SODIUM 40 MG/0.4ML ~~LOC~~ SOLN
40.0000 mg | SUBCUTANEOUS | Status: DC
  Administered 2020-02-06: 40 mg via SUBCUTANEOUS
  Filled 2020-02-05: qty 0.4

## 2020-02-05 MED ORDER — FENTANYL CITRATE (PF) 100 MCG/2ML IJ SOLN
50.0000 ug | INTRAMUSCULAR | Status: DC | PRN
  Administered 2020-02-06: 100 ug via INTRAVENOUS
  Administered 2020-02-06: 50 ug via INTRAVENOUS
  Administered 2020-02-07 (×3): 100 ug via INTRAVENOUS
  Administered 2020-02-07: 50 ug via INTRAVENOUS
  Administered 2020-02-08 (×4): 100 ug via INTRAVENOUS
  Administered 2020-02-08: 50 ug via INTRAVENOUS
  Administered 2020-02-09 (×3): 100 ug via INTRAVENOUS
  Filled 2020-02-05 (×15): qty 2

## 2020-02-05 MED ORDER — PROPOFOL 1000 MG/100ML IV EMUL
0.0000 ug/kg/min | INTRAVENOUS | Status: DC
  Administered 2020-02-05: 15 ug/kg/min via INTRAVENOUS

## 2020-02-05 MED ORDER — SODIUM CHLORIDE 0.9 % IV SOLN
INTRAVENOUS | Status: AC | PRN
  Administered 2020-02-05: 1000 mL via INTRAVENOUS

## 2020-02-05 MED ORDER — LIDOCAINE HCL (PF) 1 % IJ SOLN
INTRAMUSCULAR | Status: DC | PRN
  Administered 2020-02-05: 14 mL

## 2020-02-05 MED ORDER — MAGNESIUM SULFATE 50 % IJ SOLN
INTRAMUSCULAR | Status: AC | PRN
  Administered 2020-02-05 (×2): 2 g via INTRAVENOUS

## 2020-02-05 MED ORDER — MIDAZOLAM HCL 2 MG/2ML IJ SOLN: 2.0000 mg | INTRAMUSCULAR | Status: DC | PRN

## 2020-02-05 MED ORDER — ORAL CARE MOUTH RINSE
15.0000 mL | OROMUCOSAL | Status: DC
  Administered 2020-02-06 – 2020-02-09 (×31): 15 mL via OROMUCOSAL

## 2020-02-05 MED ORDER — IOHEXOL 350 MG/ML SOLN
INTRAVENOUS | Status: DC | PRN
  Administered 2020-02-05: 50 mL

## 2020-02-05 MED ORDER — SODIUM CHLORIDE 0.9 % IV SOLN: 250.0000 mL | INTRAVENOUS | Status: DC | PRN

## 2020-02-05 MED ORDER — MIDAZOLAM HCL 2 MG/2ML IJ SOLN
INTRAMUSCULAR | Status: AC
  Filled 2020-02-05: qty 2

## 2020-02-05 MED ORDER — PROPOFOL 1000 MG/100ML IV EMUL
5.0000 ug/kg/min | INTRAVENOUS | Status: DC
  Administered 2020-02-05: 30 ug/kg/min via INTRAVENOUS
  Administered 2020-02-06: 40 ug/kg/min via INTRAVENOUS
  Administered 2020-02-06: 50 ug/kg/min via INTRAVENOUS
  Administered 2020-02-06: 20 ug/kg/min via INTRAVENOUS
  Administered 2020-02-06: 50 ug/kg/min via INTRAVENOUS
  Administered 2020-02-07: 70 ug/kg/min via INTRAVENOUS
  Administered 2020-02-07: 60 ug/kg/min via INTRAVENOUS
  Administered 2020-02-07 (×4): 50 ug/kg/min via INTRAVENOUS
  Administered 2020-02-08 (×2): 80 ug/kg/min via INTRAVENOUS
  Administered 2020-02-08: 60 ug/kg/min via INTRAVENOUS
  Administered 2020-02-08: 80 ug/kg/min via INTRAVENOUS
  Administered 2020-02-08: 70 ug/kg/min via INTRAVENOUS
  Administered 2020-02-08: 80 ug/kg/min via INTRAVENOUS
  Administered 2020-02-08: 65 ug/kg/min via INTRAVENOUS
  Administered 2020-02-09 (×2): 80 ug/kg/min via INTRAVENOUS
  Filled 2020-02-05 (×9): qty 100
  Filled 2020-02-05: qty 200
  Filled 2020-02-05 (×7): qty 100

## 2020-02-05 MED ORDER — HEPARIN (PORCINE) IN NACL 1000-0.9 UT/500ML-% IV SOLN
INTRAVENOUS | Status: DC | PRN
  Administered 2020-02-05 (×2): 500 mL

## 2020-02-05 MED ORDER — CHLORHEXIDINE GLUCONATE 0.12% ORAL RINSE (MEDLINE KIT)
15.0000 mL | Freq: Two times a day (BID) | OROMUCOSAL | Status: DC
  Administered 2020-02-06 – 2020-02-09 (×8): 15 mL via OROMUCOSAL

## 2020-02-05 MED ORDER — ACETAMINOPHEN 325 MG PO TABS: 650.0000 mg | ORAL_TABLET | ORAL | Status: DC | PRN

## 2020-02-05 MED ORDER — SODIUM CHLORIDE 0.9% FLUSH: 3.0000 mL | INTRAVENOUS | Status: DC | PRN

## 2020-02-05 MED ORDER — ETOMIDATE 2 MG/ML IV SOLN
INTRAVENOUS | Status: AC | PRN
  Administered 2020-02-05: 20 mg via INTRAVENOUS

## 2020-02-05 SURGICAL SUPPLY — 16 items
CABLE ADAPT PACING TEMP 12FT (ADAPTER) ×2 IMPLANT
CATH 5FR JL3.5 JR4 ANG PIG MP (CATHETERS) ×2 IMPLANT
CATH INFINITI 5FR JL4 (CATHETERS) ×2 IMPLANT
CATH S G BIP PACING (CATHETERS) ×2 IMPLANT
CLOSURE MYNX CONTROL 6F/7F (Vascular Products) ×2 IMPLANT
GLIDESHEATH SLEND SS 6F .021 (SHEATH) ×2 IMPLANT
GUIDEWIRE INQWIRE 1.5J.035X260 (WIRE) ×1 IMPLANT
INQWIRE 1.5J .035X260CM (WIRE) ×2
KIT HEART LEFT (KITS) ×2 IMPLANT
PACK CARDIAC CATHETERIZATION (CUSTOM PROCEDURE TRAY) ×2 IMPLANT
SHEATH PINNACLE 6F 10CM (SHEATH) ×4 IMPLANT
SLEEVE REPOSITIONING LENGTH 30 (MISCELLANEOUS) ×2 IMPLANT
SYR MEDRAD MARK 7 150ML (SYRINGE) ×2 IMPLANT
TRANSDUCER W/STOPCOCK (MISCELLANEOUS) ×2 IMPLANT
TUBING CIL FLEX 10 FLL-RA (TUBING) ×2 IMPLANT
WIRE EMERALD 3MM-J .035X150CM (WIRE) ×2 IMPLANT

## 2020-02-05 NOTE — Consult Note (Addendum)
Cardiology Consultation:   Patient ID: Kristina Nichols MRN: [id number]; DOB: [birth year]  Admit date: 02/05/2020 Date of Consult: 02/05/2020  Primary Care Provider: None Primary Cardiologist: None (NEW) Primary Electrophysiologist:  None    Patient Profile:   Kristina Nichols is a 54 y.o. 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  History of Present Illness:   [name] is a 54yo female with a hx of depression and carrier of CMG who presented to the ER with Vfib Cardiac arrest.  According to the husband she was in her USOH but had been under a lot of stress over the past few months due to her son being very ill in the hospital over 3 months with a bacterial infection due to [facility] and almost died.  He was on a ventilator and was extubated and transferred to Rehab yesterday.    She was feeling fine this evening but then mentioned to her husband that she did not feel well and he looked over and she appeared to be having a seizure.  He started CPR on her immediately and on arrival of EMS she was in [place] and she was shocked and converted to NSR.  She had agonal breathing and was intubated in ER.  She was stable for about an hour and after head CT had vfib cardiac arrest.  She was noted to have multiple rounds of Vfib/torsades with ACLS performed.  Cardioverted multiple times and given Mag IV, epi, lidocaine and started on IV Amio gtt.  Cardiology and Critical Care were consulted.  Currently hemodynamically stable in NSR on IV Amio.     No past medical history on file.     Home Medications:  Prior to Admission medications   Medication Sig Start Date End Date Taking? Authorizing Provider  acetaminophen (TYLENOL) 500 MG tablet Take 500-1,000 mg by mouth every 6 (six) hours as needed for mild pain or headache.   Yes [provider]  buPROPion (WELLBUTRIN XL) 150 MG 24  hr tablet Take 150 mg by mouth at bedtime.   Yes [provider]  buPROPion (WELLBUTRIN XL) 300 MG 24 hr tablet Take 300 mg by mouth in the morning.   Yes [provider]  clonazePAM (KLONOPIN) 1 MG tablet Take 1 mg by mouth 3 (three) times daily as needed for anxiety.   Yes [provider]  ibuprofen (ADVIL) 200 MG tablet Take 200 mg by mouth every 6 (six) hours as needed for headache or mild pain.   Yes [provider]  lamoTRIgine (LAMICTAL) 150 MG tablet Take 150 mg by mouth 2 (two) times daily.   Yes [provider]  lisdexamfetamine (VYVANSE) 60 MG capsule Take 60 mg by mouth in the morning.   Yes [provider]  omeprazole (PRILOSEC OTC) 20 MG tablet Take 20 mg by mouth daily as needed (for heartburn or indigestion).   Yes [provider]    Inpatient Medications: Scheduled Meds: . docusate  100 mg Oral BID  . enoxaparin (LOVENOX) injection  30 mg Subcutaneous Q24H  . fentaNYL      . fentaNYL      . pantoprazole (PROTONIX) IV  40 mg Intravenous QHS  . polyethylene glycol  17 g Oral Daily   Continuous Infusions: . sodium chloride 1,000 mL (02/05/20 1925)  . amiodarone 60 mg/hr (02/05/20 1801)  . [START ON 02/06/2020] amiodarone    . [  MAR Hold] potassium chloride 10 mEq (02/05/20 1957)  . propofol (DIPRIVAN) infusion Stopped (02/05/20 1928)  . propofol (DIPRIVAN) infusion     PRN Meds: sodium chloride, acetaminophen, docusate sodium, [MAR Hold] fentaNYL (SUBLIMAZE) injection, [MAR Hold] fentaNYL (SUBLIMAZE) injection, fentaNYL (SUBLIMAZE) injection, fentaNYL (SUBLIMAZE) injection, fentaNYL, fentaNYL, midazolam, midazolam, midazolam, ondansetron (ZOFRAN) IV, polyethylene glycol  Allergies:   No Known Allergies  Social History:   Social History   Socioeconomic History  . Marital status: Not on file    Spouse name: Not on file  . Number of children: Not on file  . Years of education: Not on file  . Highest  education level: Not on file  Occupational History  . Not on file  Tobacco Use  . Smoking status: Not on file  Substance and Sexual Activity  . Alcohol use: Not on file  . Drug use: Not on file  . Sexual activity: Not on file  Other Topics Concern  . Not on file  Social History Narrative  . Not on file   Social Determinants of Health   Financial Resource Strain:   . Difficulty of Paying Living Expenses: Not on file  Food Insecurity:   . Worried About Programme researcher, broadcasting/film/video in the Last Year: Not on file  . Ran Out of Food in the Last Year: Not on file  Transportation Needs:   . Lack of Transportation (Medical): Not on file  . Lack of Transportation (Non-Medical): Not on file  Physical Activity:   . Days of Exercise per Week: Not on file  . Minutes of Exercise per Session: Not on file  Stress:   . Feeling of Stress : Not on file  Social Connections:   . Frequency of Communication with Friends and Family: Not on file  . Frequency of Social Gatherings with Friends and Family: Not on file  . Attends Religious Services: Not on file  . Active Member of Clubs or Organizations: Not on file  . Attends Banker Meetings: Not on file  . Marital Status: Not on file  Intimate Partner Violence:   . Fear of Current or Ex-Partner: Not on file  . Emotionally Abused: Not on file  . Physically Abused: Not on file  . Sexually Abused: Not on file    Family History:   Family History  Problem Relation Age of Onset  . Alzheimer's disease Father   . Chronic granulomatous disease Son      ROS:  Please see the history of present illness.   All other ROS reviewed and negative.     Physical Exam/Data:   Vitals:   02/05/20 2006 02/05/20 2010 02/05/20 2015 02/05/20 2055  BP: (!) 74/64 (!) 84/66 (!) 85/69   Pulse:   71   Resp: 18 18 14    Temp:      TempSrc:      SpO2:   99% 98%  Weight:      Height:        Intake/Output Summary (Last 24 hours) at 02/05/2020 2059 Last data  filed at 02/05/2020 1928 Gross per 24 hour  Intake 8.93 ml  Output 40 ml  Net -31.07 ml   Last 3 Weights 02/05/2020  Weight (lbs) 154 lb 5.2 oz  Weight (kg) 70 kg     Body mass index is 25.68 kg/m.  General:  Well nourished, well developed, in no acute distress HEENT: normal Lymph: no adenopathy Neck: no JVD Endocrine:  No thryomegaly Vascular: No carotid bruits; FA  pulses 2+ bilaterally without bruits  Cardiac:  normal S1, S2; RRR; no murmur  Lungs:  clear to auscultation bilaterally, no wheezing, rhonchi or rales  Abd: soft, nontender, no hepatomegaly  Ext: no edema Musculoskeletal:  No deformities, BUE and BLE strength normal and equal Skin: warm and dry  Neuro:  CNs 2-12 intact, no focal abnormalities noted Psych:  Normal affect   EKG:  The EKG was personally reviewed and demonstrates:   #1 17:59 possible atrial flutter with RVR #2 17:47 NSR with QTc and ventricular couplet #3 17:49 ST with trigeminal ventricular couplets #4 19:30 NSR with NSR QTc (after Mag)  Telemetry:  Telemetry was personally reviewed and demonstrates:  NSR  Relevant CV Studies: none  Laboratory Data:  High Sensitivity Troponin:   Recent Labs  Lab 02/05/20 1745 02/05/20 1929  TROPONINIHS 33* 88*     Chemistry Recent Labs  Lab 02/05/20 1745 02/05/20 1807 02/05/20 1853  NA 139 140  139 141  K 3.9 3.8  3.8 3.7  CL 107 105  --   CO2 21*  --   --   GLUCOSE 182* 185*  --   BUN 18 22*  --   CREATININE 1.18* 1.20*  --   CALCIUM 8.7*  --   --   GFRNONAA 56*  --   --   ANIONGAP 11  --   --     No results for input(s): PROT, ALBUMIN, AST, ALT, ALKPHOS, BILITOT in the last 168 hours. Hematology Recent Labs  Lab 02/05/20 1745 02/05/20 1807 02/05/20 1853  WBC 8.1  --   --   RBC 4.30  --   --   HGB 13.3 13.9  13.9 11.9*  HCT 42.7 41.0  41.0 35.0*  MCV 99.3  --   --   MCH 30.9  --   --   MCHC 31.1  --   --   RDW 13.5  --   --   PLT 180  --   --    BNPNo  results for input(s): BNP, PROBNP in the last 168 hours.  DDimer No results for input(s): DDIMER in the last 168 hours.   Radiology/Studies:  CT Head Wo Contrast  Result Date: 02/05/2020 CLINICAL DATA:  54 year old female with altered mental status post cardiac arrest. EXAM: CT HEAD WITHOUT CONTRAST TECHNIQUE: Contiguous axial images were obtained from the base of the skull through the vertex without intravenous contrast. COMPARISON:  None. FINDINGS: Brain: No evidence of acute infarction, hemorrhage, hydrocephalus, extra-axial collection or mass lesion/mass effect. Vascular: No hyperdense vessel or unexpected calcification. Skull: Normal. Negative for fracture or focal lesion. Sinuses/Orbits: No acute finding. Other: Oral intubation noted. IMPRESSION: No evidence of acute intracranial abnormality. Electronically Signed   By: Harmon Pier M.D.   On: 02/05/2020 18:48   DG Chest Port 1 View  Result Date: 02/05/2020 CLINICAL DATA:  Status post CPR. EXAM: PORTABLE CHEST 1 VIEW COMPARISON:  February 05, 2020 (6:03 p.m.) FINDINGS: There is stable endotracheal tube and nasogastric tube positioning. Mild diffusely increased interstitial lung markings are seen with moderate to marked severity patchy left suprahilar and right upper lobe infiltrates. Mild bilateral lower lobe infiltrates are also seen. These areas are mildly increased in severity when compared to the prior study. The heart size and mediastinal contours are within normal limits. The visualized skeletal structures are unremarkable. IMPRESSION: Moderate to marked severity bilateral patchy infiltrates, as described above, with mild interval increase in severity when compared to the prior  exam. Electronically Signed   By: Aram Candela M.D.   On: 02/05/2020 20:25   DG Chest Portable 1 View  Result Date: 02/05/2020 CLINICAL DATA:  Post intubation, witnessed cardiac arrest and CPR EXAM: PORTABLE CHEST 1 VIEW COMPARISON:  None FINDINGS:  Endotracheal tube terminates between the clavicles approximately 4 cm above the carina. Gastric tube courses through in a off the field of the radiograph, side port in the region of the mid stomach, tip off the field of view. Pacer defibrillator pads project over the patient. Upper lobe opacities are present with some elevation of the minor fissure in the RIGHT chest. Less opacity at the LEFT lung apex. On limited assessment no acute skeletal process. IMPRESSION: 1. Endotracheal tube terminates between the clavicles approximately 4 cm above the carina. 2. Suggested volume loss and consolidative changes in the RIGHT upper lobe greater than LEFT upper lobe. Findings could be related to volume loss with pneumonitis from aspiration and potential background of asymmetric pulmonary edema or pneumonia. Underlying mass would be difficult to exclude. Follow-up after resolution of acute symptoms is suggested to exclude underlying lesion. 3. Gastric tube courses through a off the field of the radiograph, side port in the region of the mid stomach, tip off the field of view. Electronically Signed   By: Donzetta Kohut M.D.   On: 02/05/2020 18:20          Assessment and Plan:   1. Cardiac Arrest -Vfib/Torsades -witnessed arrest by husband who is an EMT with CPR started immediately -initial rhythm by EMS was VTach and shocked back to NSR -developed torsades/vfib in ER and given Mag, epi and Amio -QTc on initial EKG in ER prolonged at -she is on Lamictal and Wellbutrin>>reviewed with pharmacy and these should not prolong QTc but a Lamictal overdose could cause Vfib -her husband says that she has depression but has never been suicidal -she has been under significant stress related to her son's severe illness (he almost died from a bacterial infection) so need to consider that she may have accidentally taken too much of her psych meds. -Given fm hx of cardiac disease, recommend taking to cath lab to define  coronary anatomy -Keep Mag > 2 and K+>4 -cycle troponin -check 2D echo to assess LVF -discussed with Dr. Sanjuana Kava on for interventional Cards and will take emergently for cath -discussed with EP and will place Temp wire but not use unless has more Torsades -stop Amio due to prolonged QTc -if coronary arteries are clean then can use Isuprel if more Torsades  2.  Depression -controlled per husband on Lamictal and Wellbutrin -check Lamictal level  The patient is critically ill with multiple organ systems failure and requires high complexity decision making for assessment and support, frequent evaluation and titration of therapies, application of advanced monitoring technologies and extensive interpretation of multiple databases. Critical Care Time devoted to patient care services described in this note independent of APP time is 60 minutes with >50% of time spent in direct patient care.         For questions or updates, please contact [facility] Please consult [email or web address] for contact info under     Signed, Armanda Magic, MD  02/05/2020 8:59 PM

## 2020-02-05 NOTE — Progress Notes (Addendum)
eLink Physician-Brief Progress Note Patient Name: Kristina Nichols DOB: Jan 27, 1966 MRN: [id number]   Date of Service  02/05/2020  HPI/Events of Note  Dr Nunzio Cory : discussion : [facility] A: [place] Alexias. 38 F 1) Witnessed out of hospital V tach arrest. on Vent. on amiodarone. possible torsades. Prolonged qtc. Tox + for Barbiturates. CxR possible aspiration. not on pressors. Covid neg. CT head neg.   eICU Interventions  - Cardiology been consulted from ED - notified bed side ICU team to visit.     Intervention Category Intermediate Interventions: Communication with other healthcare providers and/or family;Arrhythmia - evaluation and management  Ranee Gosselin 02/05/2020, 7:44 PM   2200 Camera eval. Now in 2H22. Post LHC by Cards.  In synchrony with vent. Just rolled in . 450/09/02/68%, sats 94%. HR 68, sinus. MAP > 70. Getting sedation orders by bed side Cards team. Off of amiodarone. On 3 mcg levophed.  Wean fio2 and Vt as tolerated.  Need VTE prophylaxis. On Protonix BG goals < 180. Lamictal level. Consider HT protocol.   Bed side ICU team notified of the arrival.   23:30 Notes reviewed. EF low, normal coronaries. Looks like stress induced cardiomyopathy. Has temporary pacing wires. In NSR. No HT protocol, normothermia as per bed side ICU evaluation.

## 2020-02-05 NOTE — ED Triage Notes (Signed)
Pt arrives via EMS from home post CPR. Per EMS pt may have had a seizure, unresponsive and unknown downtime Upon arrival pt in VTach and shocked at 200J. NSR achieved.   2mg  versed fent given IO established left leg  150/90 HR 115 RR 12 assisted 94% bagged

## 2020-02-05 NOTE — ED Notes (Signed)
Propofol infusion stopped. Verbal order from Dr. Freida Busman.

## 2020-02-05 NOTE — ED Notes (Signed)
Color change good. 21 a the lip

## 2020-02-05 NOTE — ED Notes (Signed)
CCM at bedside 

## 2020-02-05 NOTE — ED Notes (Signed)
Pt in VTACK. Shocked. 150J. NSR achived

## 2020-02-05 NOTE — ED Notes (Signed)
 fent push given

## 2020-02-05 NOTE — ED Provider Notes (Signed)
[facility] EMERGENCY DEPARTMENT Provider Note   CSN: [id number] Arrival date & time: 02/05/20  1737     History Chief Complaint  Patient presents with  . post cpr    Kristina Nichols is a 54 y.o. female.  The history is provided by the EMS personnel.  Illness Location:  Cardiac arrest  Quality:  Witnessed down by husband, quick initiation of CPR, EMS report VTach on monitor- shock with ROSC- bagged until arrival Severity:  Severe Onset quality:  Sudden Chronicity:  New      History reviewed. No pertinent past medical history.  Patient Active Problem List   Diagnosis Date Noted  . Arrhythmia 02/05/2020  . Cardiac arrest [facility])     Past Surgical History:  Procedure Laterality Date  . CHOLECYSTECTOMY    . PARTIAL HYSTERECTOMY       OB History   No obstetric history on file.     Family History  Problem Relation Age of Onset  . Alzheimer's disease Father   . Chronic granulomatous disease Son     Social History   Tobacco Use  . Smoking status: Not on file  Substance Use Topics  . Alcohol use: Not on file  . Drug use: Not on file    Home Medications Prior to Admission medications   Medication Sig Start Date End Date Taking? Authorizing Provider  acetaminophen (TYLENOL) 500 MG tablet Take 500-1,000 mg by mouth every 6 (six) hours as needed for mild pain or headache.   Yes [provider]  buPROPion (WELLBUTRIN XL) 150 MG 24 hr tablet Take 150 mg by mouth at bedtime.   Yes [provider]  buPROPion (WELLBUTRIN XL) 300 MG 24 hr tablet Take 300 mg by mouth in the morning.   Yes [provider]  clonazePAM (KLONOPIN) 1 MG tablet Take 1 mg by mouth 3 (three) times daily as needed for anxiety.   Yes [provider]  ibuprofen (ADVIL) 200 MG tablet Take 200 mg by mouth every 6 (six) hours as needed for headache or mild pain.   Yes [provider]  lamoTRIgine (LAMICTAL) 150 MG tablet Take 150 mg by  mouth 2 (two) times daily.   Yes [provider]  lisdexamfetamine (VYVANSE) 60 MG capsule Take 60 mg by mouth in the morning.   Yes [provider]  omeprazole (PRILOSEC OTC) 20 MG tablet Take 20 mg by mouth daily as needed (for heartburn or indigestion).   Yes [provider]    Allergies    Patient has no known allergies.  Review of Systems   Review of Systems  Unable to perform ROS: Intubated    Physical Exam Updated Vital Signs BP (P) 100/80 (BP Location: Left Arm)   Pulse (P) 68   Temp (!) 95.5 F (35.3 C) (Temporal)   Resp 18   Ht _0  (1.651 m)   Wt 70 kg   LMP  (LMP Unknown)   SpO2 (P) 100%   BMI 25.68 kg/m   Physical Exam Vitals reviewed.  Constitutional:      General: She is in acute distress.  HENT:     Head: Normocephalic and atraumatic.     Nose: Nose normal.     Mouth/Throat:     Mouth: Mucous membranes are moist.  Eyes:     Conjunctiva/sclera: Conjunctivae normal.     Comments: Pupils 73m bilaterally minimally reactive  Cardiovascular:     Rate and Rhythm: Tachycardia present.  Pulmonary:  Breath sounds: Rhonchi present. No wheezing.  Abdominal:     General: Abdomen is flat. There is no distension.     Palpations: Abdomen is soft. There is no mass.  Musculoskeletal:        General: No swelling or deformity.     Cervical back: Neck supple.     Right lower leg: No edema.     Left lower leg: No edema.  Skin:    General: Skin is warm and dry.  Neurological:     GCS: GCS eye subscore is 1. GCS verbal subscore is 1. GCS motor subscore is 1.     Motor: No tremor, abnormal muscle tone or seizure activity.     ED Results / Procedures / Treatments   Labs (all labs ordered are listed, but only abnormal results are displayed) Labs Reviewed  BASIC METABOLIC PANEL - Abnormal; Notable for the following components:      Result Value   CO2 21 (*)    Glucose, Bld 182 (*)    Creatinine, Ser 1.18 (*)    Calcium 8.7 (*)     GFR, Estimated 56 (*)    All other components within normal limits  RAPID URINE DRUG SCREEN, HOSP PERFORMED - Abnormal; Notable for the following components:   Benzodiazepines POSITIVE (*)    Amphetamines POSITIVE (*)    All other components within normal limits  URINALYSIS, ROUTINE W REFLEX MICROSCOPIC - Abnormal; Notable for the following components:   APPearance HAZY (*)    Glucose, UA 50 (*)    Hgb urine dipstick SMALL (*)    Protein, ur >=300 (*)    Bacteria, UA RARE (*)    All other components within normal limits  LACTIC ACID, PLASMA - Abnormal; Notable for the following components:   Lactic Acid, Venous 3.8 (*)    All other components within normal limits  LACTIC ACID, PLASMA - Abnormal; Notable for the following components:   Lactic Acid, Venous 2.4 (*)    All other components within normal limits  D-DIMER, QUANTITATIVE (NOT AT [facility]) - Abnormal; Notable for the following components:   D-Dimer, Quant >20.00 (*)    All other components within normal limits  MAGNESIUM - Abnormal; Notable for the following components:   Magnesium 4.4 (*)    All other components within normal limits  TSH - Abnormal; Notable for the following components:   TSH 7.724 (*)    All other components within normal limits  MAGNESIUM - Abnormal; Notable for the following components:   Magnesium 3.3 (*)    All other components within normal limits  BASIC METABOLIC PANEL - Abnormal; Notable for the following components:   CO2 19 (*)    Glucose, Bld 208 (*)    Calcium 6.3 (*)    All other components within normal limits  CBC - Abnormal; Notable for the following components:   MCV 101.0 (*)    All other components within normal limits  CBG MONITORING, ED - Abnormal; Notable for the following components:   Glucose-Capillary 184 (*)    All other components within normal limits  I-STAT BETA HCG BLOOD, ED ([facility], [facility], [facility] ONLY) - Abnormal; Notable for the following components:   I-stat hCG, quantitative 7.5  (*)    All other components within normal limits  I-STAT VENOUS BLOOD GAS, ED - Abnormal; Notable for the following components:   pCO2, Ven 42.0 (*)    pO2, Ven 74.0 (*)    Acid-base deficit 3.0 (*)  Calcium, Ion 1.04 (*)    All other components within normal limits  I-STAT CHEM 8, ED - Abnormal; Notable for the following components:   BUN 22 (*)    Creatinine, Ser 1.20 (*)    Glucose, Bld 185 (*)    Calcium, Ion 1.01 (*)    All other components within normal limits  I-STAT ARTERIAL BLOOD GAS, ED - Abnormal; Notable for the following components:   pH, Arterial 7.298 (*)    pCO2 arterial 52.4 (*)    pO2, Arterial 255 (*)    HCT 35.0 (*)    Hemoglobin 11.9 (*)    All other components within normal limits  TROPONIN I (HIGH SENSITIVITY) - Abnormal; Notable for the following components:   Troponin I (High Sensitivity) 33 (*)    All other components within normal limits  TROPONIN I (HIGH SENSITIVITY) - Abnormal; Notable for the following components:   Troponin I (High Sensitivity) 88 (*)    All other components within normal limits  TROPONIN I (HIGH SENSITIVITY) - Abnormal; Notable for the following components:   Troponin I (High Sensitivity) 119 (*)    All other components within normal limits  RESP PANEL BY RT PCR (RSV, FLU A&B, COVID)  MRSA PCR SCREENING  CBC  PROTIME-INR  ETHANOL  TRIGLYCERIDES  MAGNESIUM  HIV ANTIBODY (ROUTINE TESTING W REFLEX)  BLOOD GAS, ARTERIAL  CBC  BASIC METABOLIC PANEL  BLOOD GAS, ARTERIAL  MAGNESIUM  PHOSPHORUS  LAMOTRIGINE LEVEL  COMPREHENSIVE METABOLIC PANEL  LACTIC ACID, PLASMA  LACTIC ACID, PLASMA  TROPONIN I (HIGH SENSITIVITY)    EKG None  Radiology CT Head Wo Contrast  Result Date: 02/05/2020 CLINICAL DATA:  54 year old female with altered mental status post cardiac arrest. EXAM: CT HEAD WITHOUT CONTRAST TECHNIQUE: Contiguous axial images were obtained from the base of the skull through the vertex without intravenous contrast.  COMPARISON:  None. FINDINGS: Brain: No evidence of acute infarction, hemorrhage, hydrocephalus, extra-axial collection or mass lesion/mass effect. Vascular: No hyperdense vessel or unexpected calcification. Skull: Normal. Negative for fracture or focal lesion. Sinuses/Orbits: No acute finding. Other: Oral intubation noted. IMPRESSION: No evidence of acute intracranial abnormality. Electronically Signed   By: Margarette Canada M.D.   On: 02/05/2020 18:48   CARDIAC CATHETERIZATION  Result Date: 02/05/2020  There is severe left ventricular systolic dysfunction.  LV end diastolic pressure is moderately elevated.  The left ventricular ejection fraction is less than 25% by visual estimate.  There is no mitral valve regurgitation.  1. No angiographic evidence of CAD 2. Severe segmental LV systolic dysfunction with wall motion suggesting Takotsubo's cardiomyopathy (stress induced cardiomyopathy). 3. Elevated LVEDP 4. Ventricular fibrillation/Torsades-Temporary Pacemaker placed per request of admitting/consulting team in case of need of overdrive pacing tonight Recommendations: No further ischemic workup. Management of VF/Torsades per EP team in the am. Medical management of likely stress induced cardiomyopathy.   DG Chest Port 1 View  Result Date: 02/05/2020 CLINICAL DATA:  Status post CPR. EXAM: PORTABLE CHEST 1 VIEW COMPARISON:  February 05, 2020 (6:03 p.m.) FINDINGS: There is stable endotracheal tube and nasogastric tube positioning. Mild diffusely increased interstitial lung markings are seen with moderate to marked severity patchy left suprahilar and right upper lobe infiltrates. Mild bilateral lower lobe infiltrates are also seen. These areas are mildly increased in severity when compared to the prior study. The heart size and mediastinal contours are within normal limits. The visualized skeletal structures are unremarkable. IMPRESSION: Moderate to marked severity bilateral patchy infiltrates, as described  above,  with mild interval increase in severity when compared to the prior exam. Electronically Signed   By: Virgina Norfolk M.D.   On: 02/05/2020 20:25   DG Chest Portable 1 View  Result Date: 02/05/2020 CLINICAL DATA:  Post intubation, witnessed cardiac arrest and CPR EXAM: PORTABLE CHEST 1 VIEW COMPARISON:  None FINDINGS: Endotracheal tube terminates between the clavicles approximately 4 cm above the carina. Gastric tube courses through in a off the field of the radiograph, side port in the region of the mid stomach, tip off the field of view. Pacer defibrillator pads project over the patient. Upper lobe opacities are present with some elevation of the minor fissure in the RIGHT chest. Less opacity at the LEFT lung apex. On limited assessment no acute skeletal process. IMPRESSION: 1. Endotracheal tube terminates between the clavicles approximately 4 cm above the carina. 2. Suggested volume loss and consolidative changes in the RIGHT upper lobe greater than LEFT upper lobe. Findings could be related to volume loss with pneumonitis from aspiration and potential background of asymmetric pulmonary edema or pneumonia. Underlying mass would be difficult to exclude. Follow-up after resolution of acute symptoms is suggested to exclude underlying lesion. 3. Gastric tube courses through a off the field of the radiograph, side port in the region of the mid stomach, tip off the field of view. Electronically Signed   By: Zetta Bills M.D.   On: 02/05/2020 18:20    Procedures Procedure Name: Intubation Date/Time: 02/05/2020 5:53 PM Performed by: Roosevelt Locks, MD Pre-anesthesia Checklist: Patient identified, Emergency Drugs available, Suction available, Patient being monitored and Timeout performed Oxygen Delivery Method: Ambu bag Preoxygenation: Pre-oxygenation with 100% oxygen Induction Type: Rapid sequence Ventilation: Mask ventilation without difficulty Laryngoscope Size: Mac and 3 Grade View: Grade  I Tube size: 7.5 mm Number of attempts: 1 Airway Equipment and Method: Rigid stylet Placement Confirmation: ETT inserted through vocal cords under direct vision,  Positive ETCO2 and Breath sounds checked- equal and bilateral Secured at: 22 cm Tube secured with: ETT holder      (including critical care time)  Medications Ordered in ED Medications  propofol (DIPRIVAN) 1000 MG/100ML infusion (has no administration in time range)  fentaNYL (SUBLIMAZE) 100 MCG/2ML injection (has no administration in time range)  potassium chloride 10 mEq in 100 mL IVPB ( Intravenous MAR Unhold 02/05/20 2148)  fentaNYL (SUBLIMAZE) 100 MCG/2ML injection (has no administration in time range)  0.9 %  sodium chloride infusion (1,000 mLs Intravenous New Bag/Given 02/05/20 1925)  docusate sodium (COLACE) capsule 100 mg (has no administration in time range)  polyethylene glycol (MIRALAX / GLYCOLAX) packet 17 g (has no administration in time range)  pantoprazole (PROTONIX) injection 40 mg (has no administration in time range)  acetaminophen (TYLENOL) tablet 650 mg (has no administration in time range)  docusate (COLACE) 50 MG/5ML liquid 100 mg (has no administration in time range)  polyethylene glycol (MIRALAX / GLYCOLAX) packet 17 g (has no administration in time range)  midazolam (VERSED) injection 2 mg (has no administration in time range)  midazolam (VERSED) injection 2 mg (has no administration in time range)  fentaNYL (SUBLIMAZE) injection 50 mcg (50 mcg Intravenous Given 02/05/20 2241)  fentaNYL (SUBLIMAZE) injection 50-200 mcg (has no administration in time range)  labetalol (NORMODYNE) injection 10 mg (has no administration in time range)  hydrALAZINE (APRESOLINE) injection 10 mg (has no administration in time range)  0.9 %  sodium chloride infusion (has no administration in time range)  sodium chloride flush (NS) 0.9 % injection  3 mL (has no administration in time range)  sodium chloride flush (NS) 0.9  % injection 3 mL (has no administration in time range)  0.9 %  sodium chloride infusion (has no administration in time range)  enoxaparin (LOVENOX) injection 40 mg (has no administration in time range)  0.9 %  sodium chloride infusion (has no administration in time range)  norepinephrine (LEVOPHED) 40m in 2566mpremix infusion (has no administration in time range)  chlorhexidine gluconate (MEDLINE KIT) (PERIDEX) 0.12 % solution 15 mL (has no administration in time range)  MEDLINE mouth rinse (has no administration in time range)  rocuronium (ZEMURON) injection (80 mg Intravenous Given 02/05/20 1745)  etomidate (AMIDATE) injection (20 mg Intravenous Given 02/05/20 1742)  magnesium sulfate (IV Push/IM) injection (2 g Intravenous Given 02/05/20 1933)  lidocaine (cardiac) 100 mg/56m51mXYLOCAINE) injection 2% (100 mg Intravenous Given 02/05/20 1937)  magnesium sulfate (IV Push/IM) injection (2 g Intravenous Given 02/05/20 1932)  atropine injection (1 mg Intravenous Given 02/05/20 1926)  EPINEPHrine (ADRENALIN) 1 MG/10ML injection (1 mg Intravenous Given 02/05/20 1935)  lidocaine (cardiac) 100 mg/56mL656mYLOCAINE) injection 2% (100 mg Intravenous Given 02/05/20 1936)  norepinephrine (LEVOPHED) 4 mg in dextrose 5 % 250 mL (0.016 mg/mL) infusion (3 mcg/min Intravenous New Bag/Given 02/05/20 2111)    ED Course  I have reviewed the triage vital signs and the nursing notes.  Pertinent labs & imaging results that were available during my care of the patient were reviewed by me and considered in my medical decision making (see chart for details).    MDM Rules/Calculators/A&P                          Medical Decision Making: LadoDashae Wilchera 53 y72. female who presented to the ED today with cardiac arrest.  Past medical history significant for anxiety, depression.  Reviewed and confirmed nursing documentation for past medical history, family history, social history. Husband reports pt has been under a  lot of stress recently as they have a son sick with serious pneumonia in setting of chronic granulomatous disease, has been hospitalized for some time. Husband reports pt has hx of depression and anxiety and takes lamictal, wellbutrin, and klonopin. Denies drug or alcohol use.  Per husband report pt was in bed with husband this evening when she said she was feeling bad, like she might faint, then she was unresponsive and her arms were contracted he was concerned for seizures (no shaking or eye deviation), he then noted she was breathing shallow and then he did not feel pulses, moved her to floor and initiated CPR immediately, EMS on arrival report Vtach on monitor, shock with ROSC achieved. Pt bagged in route, on arrival, sinus tachy, pt making effort for breathing but GCS 3. Intubated for airway protection shortly after arrival (as documented above), started on propofol. Pt mildly hypotensive pre-intubation, improved with fluids. Multiple short runs of vtach witnessed, amiodarone drip started. Initial work up was broad for cardiac, tox, neurologic, respiratory cause of cardiac arrest. Pt taken for Head CT in stable condition, no evidence of intracranial mass, bleeding or any abnormality. Developed refractory vfib vs torsades episodes in the ED requiring multiple rounds of chest compressions, cardioversion, Mag given x2, and lidocaine given. Tox screen positive for amphetamines and benzos.   Consults: Cardiology - will take to Cath lab for angio   All radiology and laboratory studies reviewed independently and with my attending physician, agree with reading provided by  radiologist unless otherwise noted.   Based on the above findings, I believe patient requires ICU admission.   The above care was discussed with and agreed upon by my attending physician.   Emergency Department Medication Summary:  Medications  propofol (DIPRIVAN) 1000 MG/100ML infusion (has no administration in time range)  fentaNYL  (SUBLIMAZE) 100 MCG/2ML injection (has no administration in time range)  potassium chloride 10 mEq in 100 mL IVPB ( Intravenous MAR Unhold 02/05/20 2148)  fentaNYL (SUBLIMAZE) 100 MCG/2ML injection (has no administration in time range)  0.9 %  sodium chloride infusion (1,000 mLs Intravenous New Bag/Given 02/05/20 1925)  docusate sodium (COLACE) capsule 100 mg (has no administration in time range)  polyethylene glycol (MIRALAX / GLYCOLAX) packet 17 g (has no administration in time range)  pantoprazole (PROTONIX) injection 40 mg (has no administration in time range)  acetaminophen (TYLENOL) tablet 650 mg (has no administration in time range)  docusate (COLACE) 50 MG/5ML liquid 100 mg (has no administration in time range)  polyethylene glycol (MIRALAX / GLYCOLAX) packet 17 g (has no administration in time range)  midazolam (VERSED) injection 2 mg (has no administration in time range)  midazolam (VERSED) injection 2 mg (has no administration in time range)  fentaNYL (SUBLIMAZE) injection 50 mcg (50 mcg Intravenous Given 02/05/20 2241)  fentaNYL (SUBLIMAZE) injection 50-200 mcg (has no administration in time range)  labetalol (NORMODYNE) injection 10 mg (has no administration in time range)  hydrALAZINE (APRESOLINE) injection 10 mg (has no administration in time range)  0.9 %  sodium chloride infusion (has no administration in time range)  sodium chloride flush (NS) 0.9 % injection 3 mL (has no administration in time range)  sodium chloride flush (NS) 0.9 % injection 3 mL (has no administration in time range)  0.9 %  sodium chloride infusion (has no administration in time range)  enoxaparin (LOVENOX) injection 40 mg (has no administration in time range)  0.9 %  sodium chloride infusion (has no administration in time range)  norepinephrine (LEVOPHED) 63m in 2523mpremix infusion (has no administration in time range)  chlorhexidine gluconate (MEDLINE KIT) (PERIDEX) 0.12 % solution 15 mL (has no  administration in time range)  MEDLINE mouth rinse (has no administration in time range)  rocuronium (ZEMURON) injection (80 mg Intravenous Given 02/05/20 1745)  etomidate (AMIDATE) injection (20 mg Intravenous Given 02/05/20 1742)  magnesium sulfate (IV Push/IM) injection (2 g Intravenous Given 02/05/20 1933)  lidocaine (cardiac) 100 mg/14m47mXYLOCAINE) injection 2% (100 mg Intravenous Given 02/05/20 1937)  magnesium sulfate (IV Push/IM) injection (2 g Intravenous Given 02/05/20 1932)  atropine injection (1 mg Intravenous Given 02/05/20 1926)  EPINEPHrine (ADRENALIN) 1 MG/10ML injection (1 mg Intravenous Given 02/05/20 1935)  lidocaine (cardiac) 100 mg/14mL21mYLOCAINE) injection 2% (100 mg Intravenous Given 02/05/20 1936)  norepinephrine (LEVOPHED) 4 mg in dextrose 5 % 250 mL (0.016 mg/mL) infusion (3 mcg/min Intravenous New Bag/Given 02/05/20 2111)       Final Clinical Impression(s) / ED Diagnoses Final diagnoses:  [facility] Orders ED Discharge Orders    None       LyonRoosevelt Locks 02/05/20 2308    AlleLacretia Leigh 02/07/20 1215

## 2020-02-05 NOTE — H&P (View-Only) (Signed)
Cardiology Consultation:   Patient ID: Kristina Nichols MRN: [id number]; DOB: 08/14/1965  Admit date: 02/05/2020 Date of Consult: 02/05/2020  Primary Care Provider: None Primary Cardiologist: None (NEW) Primary Electrophysiologist:  None    Patient Profile:   Kristina Nichols is a 53 y.o. female with a hx of chronic granulomatosis carrier and depression who is being seen today for the evaluation of Vfib cardiac arrest at the request of Anthony Allen, MD.  History of Present Illness:   Kristina Nichols is a 53yo female with a hx of depression and carrier of CMG who presented to the ER with Vfib Cardiac arrest.  According to the husband she was in her USOH but had been under a lot of stress over the past few months due to her son being very ill in the hospital over 3 months with a bacterial infection due to CMG and almost died.  He was on a ventilator and was extubated and transferred to Rehab yesterday.    She was feeling fine this evening but then mentioned to her husband that she did not feel well and he looked over and she appeared to be having a seizure.  He started CPR on her immediately and on arrival of EMS she was in VTACh and she was shocked and converted to NSR.  She had agonal breathing and was intubated in ER.  She was stable for about an hour and after head CT had vfib cardiac arrest.  She was noted to have multiple rounds of Vfib/torsades with ACLS performed.  Cardioverted multiple times and given Mag IV, epi, lidocaine and started on IV Amio gtt.  Cardiology and Critical Care were consulted.  Currently hemodynamically stable in NSR on IV Amio.     No past medical history on file.     Home Medications:  Prior to Admission medications   Medication Sig Start Date End Date Taking? Authorizing Provider  acetaminophen (TYLENOL) 500 MG tablet Take 500-1,000 mg by mouth every 6 (six) hours as needed for mild pain or headache.   Yes [provider]  buPROPion (WELLBUTRIN XL) 150 MG 24  hr tablet Take 150 mg by mouth at bedtime.   Yes [provider]  buPROPion (WELLBUTRIN XL) 300 MG 24 hr tablet Take 300 mg by mouth in the morning.   Yes [provider]  clonazePAM (KLONOPIN) 1 MG tablet Take 1 mg by mouth 3 (three) times daily as needed for anxiety.   Yes [provider]  ibuprofen (ADVIL) 200 MG tablet Take 200 mg by mouth every 6 (six) hours as needed for headache or mild pain.   Yes [provider]  lamoTRIgine (LAMICTAL) 150 MG tablet Take 150 mg by mouth 2 (two) times daily.   Yes [provider]  lisdexamfetamine (VYVANSE) 60 MG capsule Take 60 mg by mouth in the morning.   Yes [provider]  omeprazole (PRILOSEC OTC) 20 MG tablet Take 20 mg by mouth daily as needed (for heartburn or indigestion).   Yes [provider]    Inpatient Medications: Scheduled Meds: . docusate  100 mg Oral BID  . enoxaparin (LOVENOX) injection  30 mg Subcutaneous Q24H  . fentaNYL      . fentaNYL      . pantoprazole (PROTONIX) IV  40 mg Intravenous QHS  . polyethylene glycol  17 g Oral Daily   Continuous Infusions: . sodium chloride 1,000 mL (02/05/20 1925)  . amiodarone 60 mg/hr (02/05/20 1801)  . [START ON 02/06/2020] amiodarone    . [  MAR Hold] potassium chloride 10 mEq (02/05/20 1957)  . propofol (DIPRIVAN) infusion Stopped (02/05/20 1928)  . propofol (DIPRIVAN) infusion     PRN Meds: sodium chloride, acetaminophen, docusate sodium, [MAR Hold] fentaNYL (SUBLIMAZE) injection, [MAR Hold] fentaNYL (SUBLIMAZE) injection, fentaNYL (SUBLIMAZE) injection, fentaNYL (SUBLIMAZE) injection, fentaNYL, fentaNYL, midazolam, midazolam, midazolam, ondansetron (ZOFRAN) IV, polyethylene glycol  Allergies:   No Known Allergies  Social History:   Social History   Socioeconomic History  . Marital status: Not on file    Spouse name: Not on file  . Number of children: Not on file  . Years of education: Not on file  . Highest  education level: Not on file  Occupational History  . Not on file  Tobacco Use  . Smoking status: Not on file  Substance and Sexual Activity  . Alcohol use: Not on file  . Drug use: Not on file  . Sexual activity: Not on file  Other Topics Concern  . Not on file  Social History Narrative  . Not on file   Social Determinants of Health   Financial Resource Strain:   . Difficulty of Paying Living Expenses: Not on file  Food Insecurity:   . Worried About Programme researcher, broadcasting/film/video in the Last Year: Not on file  . Ran Out of Food in the Last Year: Not on file  Transportation Needs:   . Lack of Transportation (Medical): Not on file  . Lack of Transportation (Non-Medical): Not on file  Physical Activity:   . Days of Exercise per Week: Not on file  . Minutes of Exercise per Session: Not on file  Stress:   . Feeling of Stress : Not on file  Social Connections:   . Frequency of Communication with Friends and Family: Not on file  . Frequency of Social Gatherings with Friends and Family: Not on file  . Attends Religious Services: Not on file  . Active Member of Clubs or Organizations: Not on file  . Attends Banker Meetings: Not on file  . Marital Status: Not on file  Intimate Partner Violence:   . Fear of Current or Ex-Partner: Not on file  . Emotionally Abused: Not on file  . Physically Abused: Not on file  . Sexually Abused: Not on file    Family History:   Family History  Problem Relation Age of Onset  . Alzheimer's disease Father   . Chronic granulomatous disease Son      ROS:  Please see the history of present illness.   All other ROS reviewed and negative.     Physical Exam/Data:   Vitals:   02/05/20 2006 02/05/20 2010 02/05/20 2015 02/05/20 2055  BP: (!) 74/64 (!) 84/66 (!) 85/69   Pulse:   71   Resp: 18 18 14    Temp:      TempSrc:      SpO2:   99% 98%  Weight:      Height:        Intake/Output Summary (Last 24 hours) at 02/05/2020 2059 Last data  filed at 02/05/2020 1928 Gross per 24 hour  Intake 8.93 ml  Output 40 ml  Net -31.07 ml   Last 3 Weights 02/05/2020  Weight (lbs) 154 lb 5.2 oz  Weight (kg) 70 kg     Body mass index is 25.68 kg/m.  General:  Well nourished, well developed, in no acute distress HEENT: normal Lymph: no adenopathy Neck: no JVD Endocrine:  No thryomegaly Vascular: No carotid bruits; FA  pulses 2+ bilaterally without bruits  Cardiac:  normal S1, S2; RRR; no murmur  Lungs:  clear to auscultation bilaterally, no wheezing, rhonchi or rales  Abd: soft, nontender, no hepatomegaly  Ext: no edema Musculoskeletal:  No deformities, BUE and BLE strength normal and equal Skin: warm and dry  Neuro:  CNs 2-12 intact, no focal abnormalities noted Psych:  Normal affect   EKG:  The EKG was personally reviewed and demonstrates:   #1 17:59 possible atrial flutter with RVR #2 17:47 NSR with QTc and ventricular couplet #3 17:49 ST with trigeminal ventricular couplets #4 19:30 NSR with NSR QTc (after Mag)  Telemetry:  Telemetry was personally reviewed and demonstrates:  NSR  Relevant CV Studies: none  Laboratory Data:  High Sensitivity Troponin:   Recent Labs  Lab 02/05/20 1745 02/05/20 1929  TROPONINIHS 33* 88*     Chemistry Recent Labs  Lab 02/05/20 1745 02/05/20 1807 02/05/20 1853  NA 139 140  139 141  K 3.9 3.8  3.8 3.7  CL 107 105  --   CO2 21*  --   --   GLUCOSE 182* 185*  --   BUN 18 22*  --   CREATININE 1.18* 1.20*  --   CALCIUM 8.7*  --   --   GFRNONAA 56*  --   --   ANIONGAP 11  --   --     No results for input(s): PROT, ALBUMIN, AST, ALT, ALKPHOS, BILITOT in the last 168 hours. Hematology Recent Labs  Lab 02/05/20 1745 02/05/20 1807 02/05/20 1853  WBC 8.1  --   --   RBC 4.30  --   --   HGB 13.3 13.9  13.9 11.9*  HCT 42.7 41.0  41.0 35.0*  MCV 99.3  --   --   MCH 30.9  --   --   MCHC 31.1  --   --   RDW 13.5  --   --   PLT 180  --   --    BNPNo  results for input(s): BNP, PROBNP in the last 168 hours.  DDimer No results for input(s): DDIMER in the last 168 hours.   Radiology/Studies:  CT Head Wo Contrast  Result Date: 02/05/2020 CLINICAL DATA:  54 year old female with altered mental status post cardiac arrest. EXAM: CT HEAD WITHOUT CONTRAST TECHNIQUE: Contiguous axial images were obtained from the base of the skull through the vertex without intravenous contrast. COMPARISON:  None. FINDINGS: Brain: No evidence of acute infarction, hemorrhage, hydrocephalus, extra-axial collection or mass lesion/mass effect. Vascular: No hyperdense vessel or unexpected calcification. Skull: Normal. Negative for fracture or focal lesion. Sinuses/Orbits: No acute finding. Other: Oral intubation noted. IMPRESSION: No evidence of acute intracranial abnormality. Electronically Signed   By: Harmon Pier M.D.   On: 02/05/2020 18:48   DG Chest Port 1 View  Result Date: 02/05/2020 CLINICAL DATA:  Status post CPR. EXAM: PORTABLE CHEST 1 VIEW COMPARISON:  February 05, 2020 (6:03 p.m.) FINDINGS: There is stable endotracheal tube and nasogastric tube positioning. Mild diffusely increased interstitial lung markings are seen with moderate to marked severity patchy left suprahilar and right upper lobe infiltrates. Mild bilateral lower lobe infiltrates are also seen. These areas are mildly increased in severity when compared to the prior study. The heart size and mediastinal contours are within normal limits. The visualized skeletal structures are unremarkable. IMPRESSION: Moderate to marked severity bilateral patchy infiltrates, as described above, with mild interval increase in severity when compared to the prior  exam. Electronically Signed   By: Aram Candela M.D.   On: 02/05/2020 20:25   DG Chest Portable 1 View  Result Date: 02/05/2020 CLINICAL DATA:  Post intubation, witnessed cardiac arrest and CPR EXAM: PORTABLE CHEST 1 VIEW COMPARISON:  None FINDINGS:  Endotracheal tube terminates between the clavicles approximately 4 cm above the carina. Gastric tube courses through in a off the field of the radiograph, side port in the region of the mid stomach, tip off the field of view. Pacer defibrillator pads project over the patient. Upper lobe opacities are present with some elevation of the minor fissure in the RIGHT chest. Less opacity at the LEFT lung apex. On limited assessment no acute skeletal process. IMPRESSION: 1. Endotracheal tube terminates between the clavicles approximately 4 cm above the carina. 2. Suggested volume loss and consolidative changes in the RIGHT upper lobe greater than LEFT upper lobe. Findings could be related to volume loss with pneumonitis from aspiration and potential background of asymmetric pulmonary edema or pneumonia. Underlying mass would be difficult to exclude. Follow-up after resolution of acute symptoms is suggested to exclude underlying lesion. 3. Gastric tube courses through a off the field of the radiograph, side port in the region of the mid stomach, tip off the field of view. Electronically Signed   By: Donzetta Kohut M.D.   On: 02/05/2020 18:20          Assessment and Plan:   1. Cardiac Arrest -Vfib/Torsades -witnessed arrest by husband who is an EMT with CPR started immediately -initial rhythm by EMS was VTach and shocked back to NSR -developed torsades/vfib in ER and given Mag, epi and Amio -QTc on initial EKG in ER prolonged at -she is on Lamictal and Wellbutrin>>reviewed with pharmacy and these should not prolong QTc but a Lamictal overdose could cause Vfib -her husband says that she has depression but has never been suicidal -she has been under significant stress related to her son's severe illness (he almost died from a bacterial infection) so need to consider that she may have accidentally taken too much of her psych meds. -Given fm hx of cardiac disease, recommend taking to cath lab to define  coronary anatomy -Keep Mag > 2 and K+>4 -cycle troponin -check 2D echo to assess LVF -discussed with Dr. Sanjuana Kava on for interventional Cards and will take emergently for cath -discussed with EP and will place Temp wire but not use unless has more Torsades -stop Amio due to prolonged QTc -if coronary arteries are clean then can use Isuprel if more Torsades  2.  Depression -controlled per husband on Lamictal and Wellbutrin -check Lamictal level  The patient is critically ill with multiple organ systems failure and requires high complexity decision making for assessment and support, frequent evaluation and titration of therapies, application of advanced monitoring technologies and extensive interpretation of multiple databases. Critical Care Time devoted to patient care services described in this note independent of APP time is 60 minutes with >50% of time spent in direct patient care.         For questions or updates, please contact [facility] Please consult [email or web address] for contact info under     Signed, Armanda Magic, MD  02/05/2020 8:59 PM

## 2020-02-05 NOTE — H&P (Addendum)
NAME:  Kristina Nichols, MRN:  [id number], DOB:  [birth year], LOS: 0 ADMISSION DATE:  02/05/2020, CONSULTATION DATE:  02/05/20 REFERRING MD: Freida Busman (ED), CHIEF COMPLAINT:  Cardiac arrest   Brief History   54 yo woman here after witnessed cardiac arrest at home.     History of present illness   Witnessed loss of consciousness, possibly seizure activity by her husband.   7 min of CPR reported, including Chest compressions done by her husband who is reportedly an EMT.  EMS cardioverted her and transported her, reportedly gave her sedating medication for agitation and reaching for the NP airway in route.  She was immediately intubated in the ED for airway protection.  Initial work up was done, relatively stable initially.  Started on propofol  Developed refractory vfib/torsades in the ED.   Multiple cardioversions.   Mag 2gm given x 2 Lidocaine 100mg  given x1. Started on amiodarone gtt.  Per nurse received a total of 2L NS.  Tox screen pos for amphetamines, benzodiazepenes.   Mild hypotension, MAP mid 60s, following arrest.  Cardiology has evaluated, patient being taken to cath lab for coronary angiography now.   Past Medical History  HLD Depression/Anxiety - Klonopin, lamictal GERD Hypothyroidism - stopped med 08/2017 per last clinic note Insomnia - trazodone Hearing loss Endometriosis Gastroparesis Lymphedema Sp cholecystectomy, hyterectomy DOE 08/2017  Meds: wellbutrin, klonopin, neuronitn, advil, lamictal 100BID, omeprazole, levothyroxine, vyvanse 60 daily  Significant Hospital Events     Consults:  Cardiology   Procedures:  Cardiac cath 10/24   Significant Diagnostic Tests:  CT head negative CXR B upper lobe infiltrates  Micro Data:    Antimicrobials:    Interim history/subjective:    Objective   Blood pressure 138/85, pulse 86, resp. rate 19, height 5\' 5"  (1.651 m), weight 70 kg, SpO2 100 %.    Vent Mode: PRVC FiO2 (%):  [100 %] 100 % Set Rate:  [18  bmp] 18 bmp Vt Set:  [450 mL] 450 mL PEEP:  [5 cmH20] 5 cmH20 Plateau Pressure:  [22 cmH20] 22 cmH20   Intake/Output Summary (Last 24 hours) at 02/05/2020 1949 Last data filed at 02/05/2020 1757 Gross per 24 hour  Intake --  Output 40 ml  Net -40 ml   Filed Weights   02/05/20 1800  Weight: 70 kg    Examination: General: Intubated, moving all extremities, appears to be reaching up to face with r hand.  Not responsive to voice.   HENT: ET tube in place, OG, blood tinged, frothy secretions from ET tube.   Lungs: B crackles Cardiovascular: RRR  Abdomen: nt, nd, nbs Extremities: no edema, no erythema, some mottling erythema will pallor on neck and chest   Neuro: opens eyes to voice, does not follow commands. PERRLmoving extremities, appears possibly purposeful.   Resolved Hospital Problem list     Assessment & Plan:  Vfib/torsades de pointes:  Cause not clear.  No known cardiac history though she was evaluated for dyspnea in 2019.  EKG currently does not show overt signs of acute ischemia.  Consider possibly medication induced arrhythmia, concerned about her taking lamictal.  Amphetamines positive on UDS, though could be a false positive (trazodone can sometimes cause, also takes vyvanse).   Reportedly stopped levoxyl but will check TSH. Will need to review active medications with husband.  She does have a prolonged QTc on her current EKG.   Arrhythmia treated as described above.   Remains on amiodarone infusion.  Will Reevaluate her after caridac cath evaluation  for possible coronary disease.  Goal Normothermia 37.5.   Encephalopathy: post arrest.  Intubatied for airway protection.  Increased RR to 22 for mild resp acidosis in the post intubation period.    B infiltrates on CXR: likely pulmonary edema.   Monitor.   Increase PEEP as needed.  Consider diuresis.   Best practice:  Diet: NPO Pain/Anxiety/Delirium protocol (if indicated): PRN fentanyl and versed.  VAP protocol  (if indicated): yes DVT prophylaxis: lovenox GI prophylaxis: protonix Glucose control:  Mobility: bed Code Status: Full Family Communication: to speak with husband.  Disposition:   Labs   CBC: Recent Labs  Lab 02/05/20 1745 02/05/20 1807 02/05/20 1853  WBC 8.1  --   --   HGB 13.3 13.9  13.9 11.9*  HCT 42.7 41.0  41.0 35.0*  MCV 99.3  --   --   PLT 180  --   --     Basic Metabolic Panel: Recent Labs  Lab 02/05/20 1745 02/05/20 1806 02/05/20 1807 02/05/20 1853  NA 139  --  140  139 141  K 3.9  --  3.8  3.8 3.7  CL 107  --  105  --   CO2 21*  --   --   --   GLUCOSE 182*  --  185*  --   BUN 18  --  22*  --   CREATININE 1.18*  --  1.20*  --   CALCIUM 8.7*  --   --   --   MG  --  2.0  --   --    GFR: Estimated Creatinine Clearance: 54.5 mL/min (A) (by C-G formula based on SCr of 1.2 mg/dL (H)). Recent Labs  Lab 02/05/20 1745  WBC 8.1    Liver Function Tests: No results for input(s): AST, ALT, ALKPHOS, BILITOT, PROT, ALBUMIN in the last 168 hours. No results for input(s): LIPASE, AMYLASE in the last 168 hours. No results for input(s): AMMONIA in the last 168 hours.  ABG    Component Value Date/Time   PHART 7.298 (L) 02/05/2020 1853   PCO2ART 52.4 (H) 02/05/2020 1853   PO2ART 255 (H) 02/05/2020 1853   HCO3 25.7 02/05/2020 1853   TCO2 27 02/05/2020 1853   ACIDBASEDEF 1.0 02/05/2020 1853   O2SAT 100.0 02/05/2020 1853     Coagulation Profile: Recent Labs  Lab 02/05/20 1745  INR 1.0    Cardiac Enzymes: No results for input(s): CKTOTAL, CKMB, CKMBINDEX, TROPONINI in the last 168 hours.  HbA1C: No results found for: HGBA1C  CBG: Recent Labs  Lab 02/05/20 1745  GLUCAP 184*    Review of Systems:   Unable to assess  Past Medical History  She,  has no past medical history on file.   Surgical History     Social History      Family History   Her family history is not on file.   Allergies No Known Allergies   Home Medications   Prior to Admission medications   Medication Sig Start Date End Date Taking? Authorizing Provider  acetaminophen (TYLENOL) 500 MG tablet Take 500-1,000 mg by mouth every 6 (six) hours as needed for mild pain or headache.   Yes [provider]  buPROPion (WELLBUTRIN XL) 150 MG 24 hr tablet Take 150 mg by mouth at bedtime.   Yes [provider]  buPROPion (WELLBUTRIN XL) 300 MG 24 hr tablet Take 300 mg by mouth in the morning.   Yes [provider]  clonazePAM (KLONOPIN) 1 MG tablet  Take 1 mg by mouth 3 (three) times daily as needed for anxiety.   Yes [provider]  ibuprofen (ADVIL) 200 MG tablet Take 200 mg by mouth every 6 (six) hours as needed for headache or mild pain.   Yes [provider]  lamoTRIgine (LAMICTAL) 150 MG tablet Take 150 mg by mouth 2 (two) times daily.   Yes [provider]  lisdexamfetamine (VYVANSE) 60 MG capsule Take 60 mg by mouth in the morning.   Yes [provider]  omeprazole (PRILOSEC OTC) 20 MG tablet Take 20 mg by mouth daily as needed (for heartburn or indigestion).   Yes [provider]     Critical care time: 40 minutes     Addendum:  Spoke to patient's husband later in evening.  She does not abuse drugs.  Has been experiencing stress of their 19 year old son being hospitalized and recently critically ill.  Takes lamictal, wellbutrin, vyvance, klonipin prn (usually BID) and motrin or tylenol prn.  Does not take levoxyl or trazodone. No known hx of overdose, intentional or unintentional.   She was acting normally prior to sudden change in mental status.  She usually is awake at night and sleeps during day.  She was resting as per usual during the day, when he suddenly noticed her breathing pattern changed, became much more sonorous. She was unarousable, no pulse so he started CPR.   Patient returned from cath lab, coronaries normal, very low EF < 25% and findings consistent with stress  induced cardiomyopathy.  On levophed 26mcg/ min for hypotension.  Arousable, moving all extremities.  Cool in periphery.  Temp pacer wires in place.   Sinus rhythm now.  My bedside echo: very poor LVEF, minimal apical movement.   Hold lamictal and wellbutrin for now.  Supportive care.  Normothermia maintenance order set has been placed for IF she develops a fever.  amio stopped by cardiology due to prolonged QT.  She is diuresing well on her own, O2 requirements are decreasing, no further frothy sputum from ET tube at this time.   Cont to monitor I/O.  Central line if needed for Increased pressor/inotropy doses.   Recheck labs now.  Monitor BP closely.  Sedation as needed.

## 2020-02-05 NOTE — Progress Notes (Signed)
Pt transported from cath lab to 2H 22 w/o event.

## 2020-02-05 NOTE — ED Provider Notes (Addendum)
I saw and evaluated the patient, reviewed the resident's note and I agree with the findings and plan.  EKG:   54 year old female presents after having witnessed cardiac arrest.  Downtime for possibly several minutes.  Was found to be in V. tach and cardioverted times once.  No medications given.  Patient intubated here for airway protection.  Labs and imaging pending at this time.  Will consult cardiology.  7:45 PM Patient on multiple rounds of ventricular fibrillation along with torsades. ACLS protocol was performed. She was cardioverted multiple times as well as given medications with intermittent return of spontaneous circulation. Patient with questionable torsades and given magnesium x2.  Consultation to cardiology requested and they are here seeing the patient.  CRITICAL CARE Performed by: Toy Baker Total critical care time: 60 minutes Critical care time was exclusive of separately billable procedures and treating other patients. Critical care was necessary to treat or prevent imminent or life-threatening deterioration. Critical care was time spent personally by me on the following activities: development of treatment plan with patient and/or surrogate as well as nursing, discussions with consultants, evaluation of patient's response to treatment, examination of patient, obtaining history from patient or surrogate, ordering and performing treatments and interventions, ordering and review of laboratory studies, ordering and review of radiographic studies, pulse oximetry and re-evaluation of patient's condition.    Lorre Nick, MD 02/05/20 1754    Lorre Nick, MD 02/05/20 [phone]

## 2020-02-05 NOTE — Code Documentation (Signed)
Pt moving all extremities and biting ET tube.

## 2020-02-05 NOTE — Progress Notes (Signed)
CRITICAL VALUE ALERT  Critical Value:  Ca+ 6.3, Troponin 119  Date & Time Notified:  02/05/20, 22:00  Provider Notified: Peterson Lombard, MD  Orders Received/Actions taken: No new orders received

## 2020-02-06 ENCOUNTER — Inpatient Hospital Stay (HOSPITAL_COMMUNITY): Payer: BC Managed Care – PPO

## 2020-02-06 ENCOUNTER — Other Ambulatory Visit: Payer: Self-pay

## 2020-02-06 ENCOUNTER — Encounter (HOSPITAL_COMMUNITY): Payer: Self-pay | Admitting: Pulmonary Disease

## 2020-02-06 ENCOUNTER — Inpatient Hospital Stay: Payer: Self-pay

## 2020-02-06 ENCOUNTER — Encounter (HOSPITAL_COMMUNITY)
Admission: EM | Disposition: A | Discharge status: Home / Self Care | Payer: Self-pay | Source: Home / Self Care | Attending: Pulmonary Disease

## 2020-02-06 DIAGNOSIS — I469 Cardiac arrest, cause unspecified: Secondary | ICD-10-CM

## 2020-02-06 DIAGNOSIS — I5021 Acute systolic (congestive) heart failure: Secondary | ICD-10-CM

## 2020-02-06 DIAGNOSIS — R570 Cardiogenic shock: Secondary | ICD-10-CM

## 2020-02-06 HISTORY — PX: IABP INSERTION: CATH118242

## 2020-02-06 HISTORY — PX: RIGHT HEART CATH: CATH118263

## 2020-02-06 LAB — POCT I-STAT EG7
Acid-Base Excess: 0 mmol/L (ref 0.0–2.0)
Acid-Base Excess: 0 mmol/L (ref 0.0–2.0)
Bicarbonate: 23.9 mmol/L (ref 20.0–28.0)
Bicarbonate: 23.7 mmol/L (ref 20.0–28.0)
Calcium, Ion: 1.14 mmol/L — ABNORMAL LOW (ref 1.15–1.40)
Calcium, Ion: 1.13 mmol/L — ABNORMAL LOW (ref 1.15–1.40)
HCT: 39 % (ref 36.0–46.0)
HCT: 39 % (ref 36.0–46.0)
Hemoglobin: 13.3 g/dL (ref 12.0–15.0)
Hemoglobin: 13.3 g/dL (ref 12.0–15.0)
O2 Saturation: 74 %
O2 Saturation: 72 %
Potassium: 4.4 mmol/L (ref 3.5–5.1)
Potassium: 4.3 mmol/L (ref 3.5–5.1)
Sodium: 138 mmol/L (ref 135–145)
Sodium: 138 mmol/L (ref 135–145)
TCO2: 25 mmol/L (ref 22–32)
TCO2: 25 mmol/L (ref 22–32)
pCO2, Ven: 34.6 mmHg — ABNORMAL LOW (ref 44.0–60.0)
pCO2, Ven: 34.4 mmHg — ABNORMAL LOW (ref 44.0–60.0)
pH, Ven: 7.448 — ABNORMAL HIGH (ref 7.250–7.430)
pH, Ven: 7.446 — ABNORMAL HIGH (ref 7.250–7.430)
pO2, Ven: 37 mmHg (ref 32.0–45.0)
pO2, Ven: 36 mmHg (ref 32.0–45.0)

## 2020-02-06 LAB — ECHOCARDIOGRAM COMPLETE
Area-P 1/2: 3.72 cm2
Height: 65 in
S' Lateral: 3.6 cm
Weight: 2589.08 oz

## 2020-02-06 LAB — CBC
HCT: 40.6 % (ref 36.0–46.0)
Hemoglobin: 13.2 g/dL (ref 12.0–15.0)
MCH: 31.3 pg (ref 26.0–34.0)
MCHC: 32.5 g/dL (ref 30.0–36.0)
MCV: 96.2 fL (ref 80.0–100.0)
Platelets: 215 10*3/uL (ref 150–400)
RBC: 4.22 MIL/uL (ref 3.87–5.11)
RDW: 13.7 % (ref 11.5–15.5)
WBC: 8.9 10*3/uL (ref 4.0–10.5)
nRBC: 0 % (ref 0.0–0.2)

## 2020-02-06 LAB — BASIC METABOLIC PANEL
Anion gap: 6 (ref 5–15)
Anion gap: 7 (ref 5–15)
Anion gap: 8 (ref 5–15)
BUN: 14 mg/dL (ref 6–20)
BUN: 14 mg/dL (ref 6–20)
BUN: 13 mg/dL (ref 6–20)
CO2: 25 mmol/L (ref 22–32)
CO2: 24 mmol/L (ref 22–32)
CO2: 23 mmol/L (ref 22–32)
Calcium: 7.8 mg/dL — ABNORMAL LOW (ref 8.9–10.3)
Calcium: 8.2 mg/dL — ABNORMAL LOW (ref 8.9–10.3)
Calcium: 8 mg/dL — ABNORMAL LOW (ref 8.9–10.3)
Chloride: 108 mmol/L (ref 98–111)
Chloride: 106 mmol/L (ref 98–111)
Chloride: 104 mmol/L (ref 98–111)
Creatinine, Ser: 0.9 mg/dL (ref 0.44–1.00)
Creatinine, Ser: 0.76 mg/dL (ref 0.44–1.00)
Creatinine, Ser: 0.73 mg/dL (ref 0.44–1.00)
GFR, Estimated: >60 mL/min (ref >60)
GFR, Estimated: >60 mL/min (ref >60)
GFR, Estimated: >60 mL/min (ref >60)
Glucose, Bld: 144 mg/dL — ABNORMAL HIGH (ref 70–99)
Glucose, Bld: 120 mg/dL — ABNORMAL HIGH (ref 70–99)
Glucose, Bld: 120 mg/dL — ABNORMAL HIGH (ref 70–99)
Potassium: 3.9 mmol/L (ref 3.5–5.1)
Potassium: 4.1 mmol/L (ref 3.5–5.1)
Potassium: 3.8 mmol/L (ref 3.5–5.1)
Sodium: 139 mmol/L (ref 135–145)
Sodium: 137 mmol/L (ref 135–145)
Sodium: 135 mmol/L (ref 135–145)

## 2020-02-06 LAB — HEPARIN LEVEL (UNFRACTIONATED): Heparin Unfractionated: 0.48 IU/mL (ref 0.30–0.70)

## 2020-02-06 LAB — COOXEMETRY PANEL
Carboxyhemoglobin: 0.8 % (ref 0.5–1.5)
Methemoglobin: 1 % (ref 0.0–1.5)
O2 Saturation: 77.8 %
Total hemoglobin: 12.8 g/dL (ref 12.0–16.0)

## 2020-02-06 LAB — MAGNESIUM
Magnesium: 2.6 mg/dL — ABNORMAL HIGH (ref 1.7–2.4)
Magnesium: 2.3 mg/dL (ref 1.7–2.4)

## 2020-02-06 LAB — GLUCOSE, CAPILLARY
Glucose-Capillary: 100 mg/dL — ABNORMAL HIGH (ref 70–99)
Glucose-Capillary: 103 mg/dL — ABNORMAL HIGH (ref 70–99)
Glucose-Capillary: 105 mg/dL — ABNORMAL HIGH (ref 70–99)
Glucose-Capillary: 118 mg/dL — ABNORMAL HIGH (ref 70–99)

## 2020-02-06 LAB — LACTIC ACID, PLASMA: Lactic Acid, Venous: 1.9 mmol/L (ref 0.5–1.9)

## 2020-02-06 LAB — MRSA PCR SCREENING: MRSA by PCR: NEGATIVE

## 2020-02-06 LAB — PHOSPHORUS: Phosphorus: 2.3 mg/dL — ABNORMAL LOW (ref 2.5–4.6)

## 2020-02-06 SURGERY — RIGHT HEART CATH
Anesthesia: LOCAL

## 2020-02-06 MED ORDER — HEPARIN (PORCINE) 25000 UT/250ML-% IV SOLN
950.0000 [IU]/h | INTRAVENOUS | Status: DC
  Administered 2020-02-06 – 2020-02-07 (×2): 850 [IU]/h via INTRAVENOUS
  Filled 2020-02-06 (×2): qty 250

## 2020-02-06 MED ORDER — SODIUM CHLORIDE 0.9% FLUSH
10.0000 mL | Freq: Two times a day (BID) | INTRAVENOUS | Status: DC
  Administered 2020-02-06 – 2020-02-10 (×7): 10 mL

## 2020-02-06 MED ORDER — SODIUM CHLORIDE 0.9% FLUSH: 10.0000 mL | INTRAVENOUS | Status: DC | PRN

## 2020-02-06 MED ORDER — LIDOCAINE BOLUS VIA INFUSION
75.0000 mg | Freq: Once | INTRAVENOUS | Status: AC
  Administered 2020-02-06: 75 mg via INTRAVENOUS
  Filled 2020-02-06: qty 76

## 2020-02-06 MED ORDER — PROSOURCE TF PO LIQD
45.0000 mL | Freq: Four times a day (QID) | ORAL | Status: DC
  Administered 2020-02-06 – 2020-02-09 (×12): 45 mL
  Filled 2020-02-06 (×11): qty 45

## 2020-02-06 MED ORDER — MIDAZOLAM HCL 2 MG/2ML IJ SOLN
2.0000 mg | INTRAMUSCULAR | Status: DC | PRN
  Administered 2020-02-07: 2 mg via INTRAVENOUS
  Filled 2020-02-06: qty 2

## 2020-02-06 MED ORDER — LIDOCAINE IN D5W 4-5 MG/ML-% IV SOLN
2.0000 mg/min | INTRAVENOUS | Status: DC
  Administered 2020-02-06 – 2020-02-09 (×6): 2 mg/min via INTRAVENOUS
  Filled 2020-02-06 (×6): qty 500

## 2020-02-06 MED ORDER — HEPARIN (PORCINE) IN NACL 1000-0.9 UT/500ML-% IV SOLN
INTRAVENOUS | Status: DC | PRN
  Administered 2020-02-06: 500 mL

## 2020-02-06 MED ORDER — POTASSIUM CHLORIDE 10 MEQ/50ML IV SOLN
10.0000 meq | INTRAVENOUS | Status: AC
  Administered 2020-02-06 (×2): 10 meq via INTRAVENOUS
  Filled 2020-02-06 (×2): qty 50

## 2020-02-06 MED ORDER — CHLORHEXIDINE GLUCONATE CLOTH 2 % EX PADS
6.0000 | MEDICATED_PAD | Freq: Every day | CUTANEOUS | Status: DC
  Administered 2020-02-08 – 2020-02-10 (×4): 6 via TOPICAL

## 2020-02-06 MED ORDER — VITAL AF 1.2 CAL PO LIQD
1000.0000 mL | ORAL | Status: DC
  Administered 2020-02-06 – 2020-02-08 (×3): 1000 mL
  Filled 2020-02-06 (×2): qty 1000

## 2020-02-06 MED ORDER — INFLUENZA VAC SPLIT QUAD 0.5 ML IM SUSY
0.5000 mL | PREFILLED_SYRINGE | INTRAMUSCULAR | Status: AC | PRN
  Administered 2020-02-15: 0.5 mL via INTRAMUSCULAR
  Filled 2020-02-06: qty 0.5

## 2020-02-06 MED ORDER — MIDAZOLAM HCL 2 MG/2ML IJ SOLN: 2.0000 mg | INTRAMUSCULAR | Status: DC | PRN

## 2020-02-06 MED ORDER — INSULIN ASPART 100 UNIT/ML ~~LOC~~ SOLN: 0.0000 [IU] | SUBCUTANEOUS | Status: DC

## 2020-02-06 MED ORDER — LIDOCAINE HCL (PF) 1 % IJ SOLN
INTRAMUSCULAR | Status: DC | PRN
  Administered 2020-02-06: 15 mL
  Administered 2020-02-06: 8 mL

## 2020-02-06 MED ORDER — POTASSIUM CHLORIDE 10 MEQ/100ML IV SOLN
10.0000 meq | INTRAVENOUS | Status: AC
  Administered 2020-02-06 (×2): 10 meq via INTRAVENOUS
  Filled 2020-02-06: qty 100

## 2020-02-06 MED ORDER — CALCIUM GLUCONATE-NACL 1-0.675 GM/50ML-% IV SOLN: 1.0000 g | Freq: Once | INTRAVENOUS | Status: DC

## 2020-02-06 MED ORDER — ACETAMINOPHEN 325 MG PO TABS
650.0000 mg | ORAL_TABLET | ORAL | Status: DC | PRN
  Administered 2020-02-07: 650 mg
  Filled 2020-02-06: qty 2

## 2020-02-06 MED FILL — Verapamil HCl IV Soln 2.5 MG/ML: INTRAVENOUS | Qty: 2 | Status: AC

## 2020-02-06 SURGICAL SUPPLY — 12 items
BALLN IABP SENSA PLUS 8F 50CC (BALLOONS) ×2
BALLOON IABP SENS PLUS 8F 50CC (BALLOONS) ×1 IMPLANT
CATH SWAN GANZ VIP 7.5F (CATHETERS) ×2 IMPLANT
PACK CARDIAC CATHETERIZATION (CUSTOM PROCEDURE TRAY) ×2 IMPLANT
SHEATH PINNACLE 8F 10CM (SHEATH) ×2 IMPLANT
SHEATH PROBE COVER 6X72 (BAG) ×2 IMPLANT
SLEEVE REPOSITIONING LENGTH 30 (MISCELLANEOUS) ×2 IMPLANT
TRANSDUCER W/STOPCOCK (MISCELLANEOUS) ×2 IMPLANT
TUBING ART PRESS 72  MALE/FEM (TUBING) ×1
TUBING ART PRESS 72 MALE/FEM (TUBING) ×1 IMPLANT
WIRE EMERALD 3MM-J .025X260CM (WIRE) ×2 IMPLANT
WIRE EMERALD 3MM-J .035X150CM (WIRE) ×2 IMPLANT

## 2020-02-06 NOTE — Progress Notes (Signed)
°  Echocardiogram 2D Echocardiogram has been performed.  Kristina Nichols 02/06/2020, 2:55 PM

## 2020-02-06 NOTE — Progress Notes (Signed)
ANTICOAGULATION CONSULT NOTE - Initial Consult  Pharmacy Consult for heparin  Indication: IABP  No Known Allergies  Patient Measurements: Height: 5\' 5"  (165.1 cm) Weight: 73.4 kg (161 lb 13.1 oz) IBW/kg (Calculated) : 57 Heparin Dosing Weight: 70kg  Vital Signs: Temp: 99.5 F (37.5 C) (10/25 2100) Temp Source: Core (10/25 1600) BP: 128/91 (10/25 2100) Pulse Rate: 70 (10/25 2100)  Labs: Recent Labs    02/05/20 1745 02/05/20 1807 02/05/20 1853 02/05/20 1929 02/05/20 2107 02/05/20 2225 02/05/20 2225 02/06/20 0214 02/06/20 1009 02/06/20 1241 02/06/20 1543 02/06/20 2104  HGB 13.3   < >   < >  --   --  12.5   < > 13.2 13.3  13.3  --   --   --   HCT 42.7   < >   < >  --   --  39.7  --  40.6 39.0  39.0  --   --   --   PLT 180  --   --   --   --  203  --  215  --   --   --   --   LABPROT 12.9  --   --   --   --   --   --   --   --   --   --   --   INR 1.0  --   --   --   --   --   --   --   --   --   --   --   HEPARINUNFRC  --   --   --   --   --   --   --   --   --   --   --  0.48  CREATININE 1.18*   < >   < >  --  0.83 0.94   < > 0.90  --  0.76 0.73  --   TROPONINIHS 33*   < >  --  88* 119* 232*  --   --   --   --   --   --    < > = values in this interval not displayed.    Estimated Creatinine Clearance: 83.5 mL/min (by C-G formula based on SCr of 0.73 mg/dL).   Medical History: Past Medical History:  Diagnosis Date  . Anxiety   . Depression   . Hyperlipidemia   . Hypothyroidism   . Insomnia     Assessment: 54 year old female presenting to [facility] with vfib cardiac arrest. Patient has continued to have vfib this morning. Patient taken urgently to cath lab and IABP was placed. Orders to start IV heparin. CBC within normal limits  Heparin level this evening is within goal range.  No overt bleeding or complications noted.  Goal of Therapy:  Heparin level 0.2-0.5 units/ml Monitor platelets by anticoagulation protocol: Yes   Plan:  Continue IV heparin at  current rate. Daily heparin level and CBC.  [facility], Reece Leader, BCCP Clinical Pharmacist  02/06/2020 9:49 PM   [facility] pharmacy phone numbers are listed on [email or web address]

## 2020-02-06 NOTE — Consult Note (Addendum)
Cardiology Consultation:   Patient ID: [name] MRN: [id number]; DOB: 1966-02-23  Admit date: 02/05/2020 Date of Consult: 02/06/2020  Primary Care Provider: No primary care provider on file. [facility] Cardiologist: No primary care provider on file.  [facility] Electrophysiologist:  None    Patient Profile:   Kristina Nichols is a 54 y.o. female with a hx of depression/anxiety, hypothyroidism (off tx), GERD, hronic granulomatosis carrier, no noted past cardiac history,  who is being seen today for the evaluation of recurrent Torsades at the request of Dr. Valeta Harms.  History of Present Illness:   Kristina Nichols was in her [facility], though of late under tremendous personal; stress with her son's recentl severe illness and prolonged hospitalization, (58month), just moved to rehab yesterday.  Last night at home with her husband he states she came out from the BR mentioned she felt faint and laid down, shortly after this he noted loud unusual snoring type breathing (he is a mRunner, broadcasting/film/video, found no pulse/breathing, called 911, and started compressions, FR arrived, continued CPR/BMV with some improvement it seemed to him in her respirations >> EMS arrived found her in torsades and shocked. In review of cardiology consult, in the ER multiple rounds of Vfib/torsades with ACLS performed.  Cardioverted multiple times and given Mag IV, epi, lidocaine and started on IV Amio gtt There was some concern of QT prolongation and the amio stopped >> maintained on lidocaine gtt (is current) She was brought emergently to the cath lab > normal coronaries, LVEF <20%, dysfunction with wall motion suggesting Takotsubo's cardiomyopathy   Seemed to do better until early thgis Am and has again required several difibrillations (looks like 5)  She is intubated and sedated current gtts Lidocaine @ 237mmin Levo is at 2 proprfol   LABS are reviewed K+ 3.9 >> 3.8 > 3.7 > 3.9 > 4.0 > 3.9 Ca++ 8.7 > 6.3 > 7.0 > 7.8 Mag 2.0 >  4.4 > 3.3 > 2.6 BUN 18 >>> 14 Creat 1.18 >>> 0.90 Lactic acid 3.8 > 2.4 > 1.3 > 1.9 WBC 8.1 >> 8.9 H/H 13/42 >>13/40 Plts 180>> 215  DDimer >20 TSH 7.724  CT brain with NAP  CXR IMPRESSION: Moderate to marked severity bilateral patchy infiltrates, as described above, with mild interval increase in severity when compared to the prior exam.  Resp panel/COVID all neg   Past Medical History:  Diagnosis Date  . Anxiety   . Depression   . Hyperlipidemia   . Hypothyroidism   . Insomnia     Past Surgical History:  Procedure Laterality Date  . ABDOMINAL HYSTERECTOMY    . CHOLECYSTECTOMY    . LEFT HEART CATH AND CORONARY ANGIOGRAPHY N/A 02/05/2020   Procedure: LEFT HEART CATH AND CORONARY ANGIOGRAPHY;  Surgeon: McBurnell BlanksMD;  [facility];  Service: Cardiovascular;  Laterality: N/A;  . PARTIAL HYSTERECTOMY    . TEMPORARY PACEMAKER N/A 02/05/2020   Procedure: TEMPORARY PACEMAKER;  Surgeon: McBurnell BlanksMD;  [facility];  Service: Cardiovascular;  Laterality: N/A;     Home Medications:  Prior to Admission medications   Medication Sig Start Date End Date Taking? Authorizing Provider  acetaminophen (TYLENOL) 500 MG tablet Take 500-1,000 mg by mouth every 6 (six) hours as needed for mild pain or headache.   Yes [provider]  buPROPion (WELLBUTRIN XL) 150 MG 24 hr tablet Take 150 mg by mouth at bedtime.   Yes [provider]  buPROPion (WELLBUTRIN XL) 300  MG 24 hr tablet Take 300 mg by mouth in the morning.   Yes [provider]  clonazePAM (KLONOPIN) 1 MG tablet Take 1 mg by mouth 3 (three) times daily as needed for anxiety.   Yes [provider]  ibuprofen (ADVIL) 200 MG tablet Take 200 mg by mouth every 6 (six) hours as needed for headache or mild pain.   Yes [provider]  lamoTRIgine (LAMICTAL) 150 MG tablet Take 150 mg by mouth 2 (two) times daily.   Yes [provider]  lisdexamfetamine (VYVANSE) 60 MG capsule Take 60 mg by mouth in the morning.   Yes [provider]  omeprazole (PRILOSEC OTC) 20 MG tablet Take 20 mg by mouth daily as needed (for heartburn or indigestion).   Yes [provider]    Inpatient Medications: Scheduled Meds: . chlorhexidine gluconate (MEDLINE KIT)  15 mL Mouth Rinse BID  . Chlorhexidine Gluconate Cloth  6 each Topical Daily  . docusate  100 mg Per Tube BID  . enoxaparin (LOVENOX) injection  40 mg Subcutaneous Q24H  . insulin aspart  0-6 Units Subcutaneous Q4H  . mouth rinse  15 mL Mouth Rinse 10 times per day  . pantoprazole (PROTONIX) IV  40 mg Intravenous QHS  . polyethylene glycol  17 g Per Tube Daily  . sodium chloride flush  10-40 mL Intracatheter Q12H  . sodium chloride flush  3 mL Intravenous Q12H   Continuous Infusions: . sodium chloride    . sodium chloride 10 mL/hr at 02/06/20 0700  . calcium gluconate    . lidocaine 2 mg/min (02/06/20 0700)  . norepinephrine (LEVOPHED) Adult infusion 2 mcg/min (02/06/20 0700)  . propofol (DIPRIVAN) infusion 20 mcg/kg/min (02/06/20 0700)   PRN Meds: sodium chloride, acetaminophen, fentaNYL (SUBLIMAZE) injection, fentaNYL (SUBLIMAZE) injection, influenza vac split quadrivalent PF, midazolam, midazolam, midazolam, midazolam, polyethylene glycol, sodium chloride flush, sodium chloride flush  Allergies:   No Known Allergies  Social History:   Social History   Socioeconomic History  . Marital status: Married    Spouse name: Albert Devaul  . Number of children: Not on file  . Years of education: Not on file  . Highest education level: Not on file  Occupational History  . Not on file  Tobacco Use  . Smoking status: Never Smoker  . Smokeless tobacco: Never Used  Vaping Use  . Vaping Use: Never used  Substance and Sexual Activity  . Alcohol use: Not Currently  . Drug use: Never  . Sexual activity: Not on file  Other Topics Concern  . Not  on file  Social History Narrative  . Not on file   Social Determinants of Health   Financial Resource Strain:   . Difficulty of Paying Living Expenses: Not on file  Food Insecurity:   . Worried About Charity fundraiser in the Last Year: Not on file  . Ran Out of Food in the Last Year: Not on file  Transportation Needs:   . Lack of Transportation (Medical): Not on file  . Lack of Transportation (Non-Medical): Not on file  Physical Activity:   . Days of Exercise per Week: Not on file  . Minutes of Exercise per Session: Not on file  Stress:   . Feeling of Stress : Not on file  Social Connections:   . Frequency of Communication with Friends and Family: Not on file  . Frequency of Social Gatherings with Friends and Family: Not on file  . Attends Religious  Services: Not on file  . Active Member of Clubs or Organizations: Not on file  . Attends Archivist Meetings: Not on file  . Marital Status: Not on file  Intimate Partner Violence:   . Fear of Current or Ex-Partner: Not on file  . Emotionally Abused: Not on file  . Physically Abused: Not on file  . Sexually Abused: Not on file    Family History:   Family History  Problem Relation Age of Onset  . Alzheimer's disease Father   . Chronic granulomatous disease Son      ROS:  Please see the history of present illness.  All other ROS reviewed and negative.     Physical Exam/Data:   Vitals:   02/06/20 0630 02/06/20 0645 02/06/20 0700 02/06/20 0800  BP: 101/78 103/78 91/66 (!) 86/53  Pulse: 74  68   Resp: (!) 22 (!) 22 (!) 22 (!) 22  Temp: 98.6 F (37 C) 98.8 F (37.1 C) 98.4 F (36.9 C) 98.8 F (37.1 C)  TempSrc:      SpO2: 100%  100%   Weight:      Height:        Intake/Output Summary (Last 24 hours) at 02/06/2020 0913 Last data filed at 02/06/2020 0800 Gross per 24 hour  Intake 678.32 ml  Output 4315 ml  Net -3636.68 ml   Last 3 Weights 02/06/2020 02/05/2020  Weight (lbs) 161 lb 13.1 oz 154 lb 5.2  oz  Weight (kg) 73.4 kg 70 kg     Body mass index is 26.93 kg/m.  General:  Well nourished, well developed, sedated HEENT: normal Lymph: no adenopathy Neck: no JVD Endocrine:  No thryomegaly Vascular: No carotid bruits Cardiac:  RRR; no murmurs, gallops or rubs Lungs:  CTA b/l, no wheezing, rhonchi or rales  Abd: soft, nontender Ext: no edema Musculoskeletal:  No deformities Skin: warm and dry  Neuro: unable to assess Psych:  Unable to assess  EKG:  The EKG was personally reviewed and demonstrates:   Initial EKG looks like an AFlutter, 122bpm SR sinus rate is 85bpm with V ciuplets in a trigeminal pattern (1st with RonT) SR 98bpm , pVCs SR 95, one couplet V bigeminy  Telemetry:  Telemetry was personally reviewed and demonstrates:   SR 70's, recurrent torsades  Relevant CV Studies:  02/06/2020: LHC  There is severe left ventricular systolic dysfunction.  LV end diastolic pressure is moderately elevated.  The left ventricular ejection fraction is less than 25% by visual estimate.  There is no mitral valve regurgitation.   1. No angiographic evidence of CAD 2. Severe segmental LV systolic dysfunction with wall motion suggesting Takotsubo's cardiomyopathy (stress induced cardiomyopathy).  3. Elevated LVEDP 4. Ventricular fibrillation/Torsades-Temporary Pacemaker placed per request of admitting/consulting team in case of need of overdrive pacing tonight   Recommendations: No further ischemic workup. Management of VF/Torsades per EP team in the am. Medical management of likely stress induced cardiomyopathy.      Laboratory Data:  High Sensitivity Troponin:   Recent Labs  Lab 02/05/20 1745 02/05/20 1929 02/05/20 2107 02/05/20 2225  TROPONINIHS 33* 88* 119* 232*     Chemistry Recent Labs  Lab 02/05/20 2107 02/05/20 2225 02/06/20 0214  NA 137 137 139  K 3.9 4.9 3.9  CL 110 109 108  CO2 19* 22 25  GLUCOSE 208* 199* 144*  BUN _0 CREATININE 0.83  0.94 0.90  CALCIUM 6.3* 7.0* 7.8*  GFRNONAA >60 >60 >60  ANIONGAP 8  6 6    Recent Labs  Lab 02/05/20 2225  PROT 5.2*  ALBUMIN 3.1*  AST 898*  ALT 482*  ALKPHOS 75  BILITOT 0.8   Hematology Recent Labs  Lab 02/05/20 1745 02/05/20 1807 02/05/20 1853 02/05/20 2225 02/06/20 0214  WBC 8.1  --   --  8.7 8.9  RBC 4.30  --   --  3.93 4.22  HGB 13.3   < > 11.9* 12.5 13.2  HCT 42.7   < > 35.0* 39.7 40.6  MCV 99.3  --   --  101.0* 96.2  MCH 30.9  --   --  31.8 31.3  MCHC 31.1  --   --  31.5 32.5  RDW 13.5  --   --  13.6 13.7  PLT 180  --   --  203 215   < > = values in this interval not displayed.   BNPNo results for input(s): BNP, PROBNP in the last 168 hours.  DDimer  Recent Labs  Lab 02/05/20 1937  DDIMER >20.00*     Radiology/Studies:  CT Head Wo Contrast  Result Date: 02/05/2020 CLINICAL DATA:  54 year old female with altered mental status post cardiac arrest. EXAM: CT HEAD WITHOUT CONTRAST TECHNIQUE: Contiguous axial images were obtained from the base of the skull through the vertex without intravenous contrast. COMPARISON:  None. FINDINGS: Brain: No evidence of acute infarction, hemorrhage, hydrocephalus, extra-axial collection or mass lesion/mass effect. Vascular: No hyperdense vessel or unexpected calcification. Skull: Normal. Negative for fracture or focal lesion. Sinuses/Orbits: No acute finding. Other: Oral intubation noted. IMPRESSION: No evidence of acute intracranial abnormality. Electronically Signed   By: Margarette Canada M.D.   On: 02/05/2020 18:48   CARDIAC CATHETERIZATION  Result Date: 02/05/2020  There is severe left ventricular systolic dysfunction.  LV end diastolic pressure is moderately elevated.  The left ventricular ejection fraction is less than 25% by visual estimate.  There is no mitral valve regurgitation.  1. No angiographic evidence of CAD 2. Severe segmental LV systolic dysfunction with wall motion suggesting Takotsubo's cardiomyopathy (stress  induced cardiomyopathy). 3. Elevated LVEDP 4. Ventricular fibrillation/Torsades-Temporary Pacemaker placed per request of admitting/consulting team in case of need of overdrive pacing tonight Recommendations: No further ischemic workup. Management of VF/Torsades per EP team in the am. Medical management of likely stress induced cardiomyopathy.   DG Chest Port 1 View  Result Date: 02/05/2020 CLINICAL DATA:  Status post CPR. EXAM: PORTABLE CHEST 1 VIEW COMPARISON:  February 05, 2020 (6:03 p.m.) FINDINGS: There is stable endotracheal tube and nasogastric tube positioning. Mild diffusely increased interstitial lung markings are seen with moderate to marked severity patchy left suprahilar and right upper lobe infiltrates. Mild bilateral lower lobe infiltrates are also seen. These areas are mildly increased in severity when compared to the prior study. The heart size and mediastinal contours are within normal limits. The visualized skeletal structures are unremarkable. IMPRESSION: Moderate to marked severity bilateral patchy infiltrates, as described above, with mild interval increase in severity when compared to the prior exam. Electronically Signed   By: Virgina Norfolk M.D.   On: 02/05/2020 20:25   DG Chest Portable 1 View Result Date: 02/05/2020 CLINICAL DATA:  Post intubation, witnessed cardiac arrest and CPR EXAM: PORTABLE CHEST 1 VIEW COMPARISON:  None FINDINGS: Endotracheal tube terminates between the clavicles approximately 4 cm above the carina. Gastric tube courses through in a off the field of the radiograph, side port in the region of the mid stomach, tip off the field of  view. Pacer defibrillator pads project over the patient. Upper lobe opacities are present with some elevation of the minor fissure in the RIGHT chest. Less opacity at the LEFT lung apex. On limited assessment no acute skeletal process. IMPRESSION: 1. Endotracheal tube terminates between the clavicles approximately 4 cm above the  carina. 2. Suggested volume loss and consolidative changes in the RIGHT upper lobe greater than LEFT upper lobe. Findings could be related to volume loss with pneumonitis from aspiration and potential background of asymmetric pulmonary edema or pneumonia. Underlying mass would be difficult to exclude. Follow-up after resolution of acute symptoms is suggested to exclude underlying lesion. 3. Gastric tube courses through a off the field of the radiograph, side port in the region of the mid stomach, tip off the field of view. Electronically Signed   By: Zetta Bills M.D.   On: 02/05/2020 18:20   .    Assessment and Plan:   1. Recurrent Torsades     In the environment of what appears Takotsubo's cardiomyopathy, no CAD  Dr. Quentin Ore and Dr. Aundra Dubin bedside formal echo is pending Single RV paced beat induced Torsades  DO NOT RV PACE THE PATIENT  Plan is to get RHC done, perhaps move the temp pacing wire to the RA May need mechanical support IABP perhaps impella Perhaps paralyze/deepen sedation, ? Hypothermia  Will continue lidocaine If unable to control Might consider revisiting amio    For questions or updates, please contact [place] Please consult [email or web address] for contact info under    Signed, Baldwin Jamaica, PA-C  02/06/2020 9:13 AM    ------------------------------------------------------------------------------------------ I have seen, examined the patient, and reviewed the above assessment and plan.    1. Out of hospital cardiac arrest / Recurrent TdP Patient admitted with severe LV dysfunction and decreased CO/CI after cardiac arrest at home. Thankfully, cardiac arrest witnessed and husband performed immediate CPR. According to husband, patient with purposeful movement after ROSC.  Now hospitalized, intubated and sedated. She has experienced recurrent TdP triggered by a purkinje related PVC (originating from the left posterior fascicle). Now electrically quiescent  on lidocaine. Amio was used briefly but stopped after ECG reported long QT.  - agree with aggressive hemodynamic support with IABP - keep intubated/sedated while hemodynamics are optimized - OK to continue lidocaine for now - if recurrent torsade, recommend starting amiodarone. Her QT was erroneously measured on the ECGs and amiodarone may be able to successfully suppress the PVC - if recurrent VT, can deepen sedation, use paralytics - if she has VT that seems to worsen with slower heart rates, recommend atrial lead and pacing. RV pacing readily induced VT and I do not recommend another RV temporary pacing wire  Vickie Epley, MD 02/06/2020 8:13 PM

## 2020-02-06 NOTE — Consult Note (Addendum)
Advanced Heart Failure Team Consult Note   Primary Physician: No primary care provider on file. PCP-Cardiologist:  No primary care provider on file.  Reason for Consultation: Cardiogenic Shock  HPI:    Aliciana Ricciardi is seen today for evaluation of cardiogenic shock at the request of Dr Radford Pax.   Ms Loose is a 54 year old with h/o depression, witnessed cardiac arrest  (received 7 minutes of CPR) at home with refractory vib/ torsades..  Over the past few months she has been under a lot of stress. Witnessed cardiac arrest at home (CPR 7 min) with refractory vib/ torsades. Had cardioversion at home followed by transport to [facility]. Intubated in ER. Drug screen + benzo + amphetamines. HS Trop [id number]. Lactic Acid 2.4>1.3>1.9. Take for Cath and this showed with normal coronaries with pacing wire left in place. LV gram EF 20% concerning for Takotsubo. Multiple cardioversion for recurrent torsades since cath. On amio drip initially but had prolonged QTc so amio was stopped. Lidocaine 2 mg started and on Norepi 2 mcg.   Recurrent torsades 0811 and received another shock. EP/CT consulted.  She also had torsades in setting of attempted RV pacing.   Echo - no formal ECHO   Review of Systems: [y] = yes, _0  = no  Patient is encephalopathic and or intubated. Therefore history has been obtained from chart review.    . General: Weight gain _1 ; Weight loss _2 ; Anorexia _3 ; Fatigue _4 ; Fever _5 ; Chills _6 ; Weakness _7   . Cardiac: Chest pain/pressure _8 ; Resting SOB _9 ; Exertional SOB _10 ; Orthopnea _11 ; Pedal Edema _12 ; Palpitations _13 ; Syncope _14 ; Presyncope _15 ; Paroxysmal nocturnal dyspnea_16   . Pulmonary: Cough _17 ; Wheezing_18 ; Hemoptysis_19 ; Sputum _20 ; Snoring _21   . GI: Vomiting_22 ; Dysphagia_23 ; Melena_24 ; Hematochezia _25 ; Heartburn_26 ; Abdominal pain _27 ; Constipation _28 ; Diarrhea _29 ; BRBPR _30   . GU: Hematuria_31 ; Dysuria _32 ; Nocturia_33   . Vascular: Pain in legs with  walking _34 ; Pain in feet with lying flat _35 ; Non-healing sores _36 ; Stroke _37 ; TIA _38 ; Slurred speech _39 ;  . Neuro: Headaches_40 ; Vertigo_41 ; Seizures_42 ; Paresthesias_43 ;Blurred vision _44 ; Diplopia _45 ; Vision changes _46   . Ortho/Skin: Arthritis _47 ; Joint pain _48 ; Muscle pain _49 ; Joint swelling _50 ; Back Pain _51 ; Rash _52   . Psych: Depression[ Y]; Anxiety_53   . Heme: Bleeding problems _54 ; Clotting disorders _55 ; Anemia _56   . Endocrine: Diabetes _57 ; Thyroid dysfunction_58   Home Medications Prior to Admission medications   Medication Sig Start Date End Date Taking? Authorizing Provider  acetaminophen (TYLENOL) 500 MG tablet Take 500-1,000 mg by mouth every 6 (six) hours as needed for mild pain or headache.   Yes [provider]  buPROPion (WELLBUTRIN XL) 150 MG 24 hr tablet Take 150 mg by mouth at bedtime.   Yes [provider]  buPROPion (WELLBUTRIN XL) 300 MG 24 hr tablet Take 300 mg by mouth in the morning.   Yes [provider]  clonazePAM (KLONOPIN) 1 MG tablet Take 1 mg by mouth 3 (three) times daily as needed for anxiety.   Yes [provider]  ibuprofen (ADVIL) 200 MG tablet Take 200 mg by mouth every 6 (six) hours as needed for headache or mild pain.  Yes [provider]  lamoTRIgine (LAMICTAL) 150 MG tablet Take 150 mg by mouth 2 (two) times daily.   Yes [provider]  lisdexamfetamine (VYVANSE) 60 MG capsule Take 60 mg by mouth in the morning.   Yes [provider]  omeprazole (PRILOSEC OTC) 20 MG tablet Take 20 mg by mouth daily as needed (for heartburn or indigestion).   Yes [provider]    Past Medical History: Past Medical History:  Diagnosis Date  . Anxiety   . Depression   . Hyperlipidemia   . Hypothyroidism   . Insomnia     Past Surgical History: Past Surgical History:  Procedure Laterality Date  . ABDOMINAL HYSTERECTOMY    . CHOLECYSTECTOMY    . LEFT HEART CATH AND CORONARY  ANGIOGRAPHY N/A 02/05/2020   Procedure: LEFT HEART CATH AND CORONARY ANGIOGRAPHY;  Surgeon: Burnell Blanks, MD;  Location: [place] [facility];  Service: Cardiovascular;  Laterality: N/A;  . PARTIAL HYSTERECTOMY    . TEMPORARY PACEMAKER N/A 02/05/2020   Procedure: TEMPORARY PACEMAKER;  Surgeon: Burnell Blanks, MD;  Location: [place] [facility];  Service: Cardiovascular;  Laterality: N/A;    Family History: Family History  Problem Relation Age of Onset  . Alzheimer's disease Father   . Chronic granulomatous disease Son     Social History: Social History   Socioeconomic History  . Marital status: Married    Spouse name: Milicent Acheampong  . Number of children: Not on file  . Years of education: Not on file  . Highest education level: Not on file  Occupational History  . Not on file  Tobacco Use  . Smoking status: Never Smoker  . Smokeless tobacco: Never Used  Vaping Use  . Vaping Use: Never used  Substance and Sexual Activity  . Alcohol use: Not Currently  . Drug use: Never  . Sexual activity: Not on file  Other Topics Concern  . Not on file  Social History Narrative  . Not on file   Social Determinants of Health   Financial Resource Strain:   . Difficulty of Paying Living Expenses: Not on file  Food Insecurity:   . Worried About Charity fundraiser in the Last Year: Not on file  . Ran Out of Food in the Last Year: Not on file  Transportation Needs:   . Lack of Transportation (Medical): Not on file  . Lack of Transportation (Non-Medical): Not on file  Physical Activity:   . Days of Exercise per Week: Not on file  . Minutes of Exercise per Session: Not on file  Stress:   . Feeling of Stress : Not on file  Social Connections:   . Frequency of Communication with Friends and Family: Not on file  . Frequency of Social Gatherings with Friends and Family: Not on file  . Attends Religious Services: Not on file  . Active Member of Clubs or Organizations: Not  on file  . Attends Archivist Meetings: Not on file  . Marital Status: Not on file    Allergies:  No Known Allergies  Objective:    Vital Signs:   Temp:  [93.2 F (34 C)-98.8 F (37.1 C)] 98.8 F (37.1 C) (10/25 0800) Pulse Rate:  [47-130] 68 (10/25 0700) Resp:  [0-38] 22 (10/25 0800) BP: (74-138)/(30-106) 86/53 (10/25 0800) SpO2:  [87 %-100 %] 100 % (10/25 0700) FiO2 (%):  [60 %-100 %] 60 % (10/25 0752) Weight:  [70 kg-73.4 kg] 73.4 kg (10/25  0000) Last BM Date:  (Unknown; PTA)  Weight change: Filed Weights   02/05/20 1800 02/06/20 0000  Weight: 70 kg 73.4 kg    Intake/Output:   Intake/Output Summary (Last 24 hours) at 02/06/2020 0847 Last data filed at 02/06/2020 0800 Gross per 24 hour  Intake 678.32 ml  Output 4315 ml  Net -3636.68 ml      Physical Exam    General: Intubated/sedated.  HEENT:ETT Neck: supple. JVP difficult to assess. Carotids 2+ bilat; no bruits. No lymphadenopathy or thyromegaly appreciated. Cor: PMI nondisplaced. Regular rate & rhythm. No rubs, gallops or murmurs. Lungs: clear Abdomen: soft, nontender, nondistended. No hepatosplenomegaly. No bruits or masses. Good bowel sounds. Extremities: no cyanosis, clubbing, rash, R and LLE 1+ edema Neuro: Sedated on vent.   Telemetry   SR   With torsades requiring multiple shocks.   EKG     Labs   Basic Metabolic Panel: Recent Labs  Lab 02/05/20 1745 02/05/20 1745 02/05/20 1806 02/05/20 1807 02/05/20 1853 02/05/20 1929 02/05/20 2107 02/05/20 2225 02/06/20 0214  NA 139   < >  --  140  139 141  --  137 137 139  K 3.9   < >  --  3.8  3.8 3.7  --  3.9 4.9 3.9  CL 107  --   --  105  --   --  110 109 108  CO2 21*  --   --   --   --   --  19* 22 25  GLUCOSE 182*  --   --  185*  --   --  208* 199* 144*  BUN 18  --   --  22*  --   --  _0 CREATININE 1.18*  --   --  1.20*  --   --  0.83 0.94 0.90  CALCIUM 8.7*   < >  --   --   --   --  6.3* 7.0* 7.8*  MG  --   --  2.0   --   --  4.4* 3.3*  --  2.6*  PHOS  --   --   --   --   --   --   --   --  2.3*   < > = values in this interval not displayed.    Liver Function Tests: Recent Labs  Lab 02/05/20 2225  AST 898*  ALT 482*  ALKPHOS 75  BILITOT 0.8  PROT 5.2*  ALBUMIN 3.1*   No results for input(s): LIPASE, AMYLASE in the last 168 hours. No results for input(s): AMMONIA in the last 168 hours.  CBC: Recent Labs  Lab 02/05/20 1745 02/05/20 1807 02/05/20 1853 02/05/20 2225 02/06/20 0214  WBC 8.1  --   --  8.7 8.9  HGB 13.3 13.9  13.9 11.9* 12.5 13.2  HCT 42.7 41.0  41.0 35.0* 39.7 40.6  MCV 99.3  --   --  101.0* 96.2  PLT 180  --   --  203 215    Cardiac Enzymes: No results for input(s): CKTOTAL, CKMB, CKMBINDEX, TROPONINI in the last 168 hours.  BNP: BNP (last 3 results) No results for input(s): BNP in the last 8760 hours.  ProBNP (last 3 results) No results for input(s): PROBNP in the last 8760 hours.   CBG: Recent Labs  Lab 02/05/20 1745  GLUCAP 184*    Coagulation Studies: Recent Labs    02/05/20 1745  LABPROT 12.9  INR 1.0  Imaging   CT Head Wo Contrast  Result Date: 02/05/2020 CLINICAL DATA:  54 year old female with altered mental status post cardiac arrest. EXAM: CT HEAD WITHOUT CONTRAST TECHNIQUE: Contiguous axial images were obtained from the base of the skull through the vertex without intravenous contrast. COMPARISON:  None. FINDINGS: Brain: No evidence of acute infarction, hemorrhage, hydrocephalus, extra-axial collection or mass lesion/mass effect. Vascular: No hyperdense vessel or unexpected calcification. Skull: Normal. Negative for fracture or focal lesion. Sinuses/Orbits: No acute finding. Other: Oral intubation noted. IMPRESSION: No evidence of acute intracranial abnormality. Electronically Signed   By: Margarette Canada M.D.   On: 02/05/2020 18:48   CARDIAC CATHETERIZATION  Result Date: 02/05/2020  There is severe left ventricular systolic  dysfunction.  LV end diastolic pressure is moderately elevated.  The left ventricular ejection fraction is less than 25% by visual estimate.  There is no mitral valve regurgitation.  1. No angiographic evidence of CAD 2. Severe segmental LV systolic dysfunction with wall motion suggesting Takotsubo's cardiomyopathy (stress induced cardiomyopathy). 3. Elevated LVEDP 4. Ventricular fibrillation/Torsades-Temporary Pacemaker placed per request of admitting/consulting team in case of need of overdrive pacing tonight Recommendations: No further ischemic workup. Management of VF/Torsades per EP team in the am. Medical management of likely stress induced cardiomyopathy.   DG Chest Port 1 View  Result Date: 02/05/2020 CLINICAL DATA:  Status post CPR. EXAM: PORTABLE CHEST 1 VIEW COMPARISON:  February 05, 2020 (6:03 p.m.) FINDINGS: There is stable endotracheal tube and nasogastric tube positioning. Mild diffusely increased interstitial lung markings are seen with moderate to marked severity patchy left suprahilar and right upper lobe infiltrates. Mild bilateral lower lobe infiltrates are also seen. These areas are mildly increased in severity when compared to the prior study. The heart size and mediastinal contours are within normal limits. The visualized skeletal structures are unremarkable. IMPRESSION: Moderate to marked severity bilateral patchy infiltrates, as described above, with mild interval increase in severity when compared to the prior exam. Electronically Signed   By: Virgina Norfolk M.D.   On: 02/05/2020 20:25   DG Chest Portable 1 View  Result Date: 02/05/2020 CLINICAL DATA:  Post intubation, witnessed cardiac arrest and CPR EXAM: PORTABLE CHEST 1 VIEW COMPARISON:  None FINDINGS: Endotracheal tube terminates between the clavicles approximately 4 cm above the carina. Gastric tube courses through in a off the field of the radiograph, side port in the region of the mid stomach, tip off the field of  view. Pacer defibrillator pads project over the patient. Upper lobe opacities are present with some elevation of the minor fissure in the RIGHT chest. Less opacity at the LEFT lung apex. On limited assessment no acute skeletal process. IMPRESSION: 1. Endotracheal tube terminates between the clavicles approximately 4 cm above the carina. 2. Suggested volume loss and consolidative changes in the RIGHT upper lobe greater than LEFT upper lobe. Findings could be related to volume loss with pneumonitis from aspiration and potential background of asymmetric pulmonary edema or pneumonia. Underlying mass would be difficult to exclude. Follow-up after resolution of acute symptoms is suggested to exclude underlying lesion. 3. Gastric tube courses through a off the field of the radiograph, side port in the region of the mid stomach, tip off the field of view. Electronically Signed   By: Zetta Bills M.D.   On: 02/05/2020 18:20   [place] EKG SITE RITE  Result Date: 02/06/2020 If Site Rite image not attached, placement could not be confirmed due to current cardiac rhythm.     Medications:  Current Medications: . chlorhexidine gluconate (MEDLINE KIT)  15 mL Mouth Rinse BID  . Chlorhexidine Gluconate Cloth  6 each Topical Daily  . docusate  100 mg Per Tube BID  . enoxaparin (LOVENOX) injection  40 mg Subcutaneous Q24H  . insulin aspart  0-6 Units Subcutaneous Q4H  . mouth rinse  15 mL Mouth Rinse 10 times per day  . pantoprazole (PROTONIX) IV  40 mg Intravenous QHS  . polyethylene glycol  17 g Per Tube Daily  . sodium chloride flush  10-40 mL Intracatheter Q12H  . sodium chloride flush  3 mL Intravenous Q12H     Infusions: . sodium chloride    . sodium chloride 10 mL/hr at 02/06/20 0700  . lidocaine 2 mg/min (02/06/20 0700)  . norepinephrine (LEVOPHED) Adult infusion 2 mcg/min (02/06/20 0700)  . propofol (DIPRIVAN) infusion 20 mcg/kg/min (02/06/20 0700)       Patient Profile  Ms Burling is a 54  year old with h/o witnessed cardiac arrest at home with refractory vib/ torsades.  Intubated in ER.  Taken for [facility] with clean coronaries, findings consistent with stress induced cardiomyopathy, LVEF < 25%.     Assessment/Plan   1. V Fib /V tachArrest- refractory torsades -Cath last night with normal coronaries. Temp wire left in place.  -Multiple shocks. Overall 6 shocks.  -K and Mag replaced.  -EP consulted. Attempted to RV pace but went back in torsades.  - Was on amio drip initially but stopped due to prolonged Qtc. Continue lidocaine drip 2 mg per hour.  2. Cardiogenic Shock /Acute Systolic HF -Cath no coronary disease, LV gram concerning for Takotsbo, EF 20%.  - Elevated LFTs in setting of shock. -Currently on norepi 2 mcg. Will need swan to further assess hemodynamics.  - May need mechanical support based on swan numbers.  CT surgery consulted.   3. Acute Respiratory Failure -Vent per CCM   4. Depression On wellbutrin/klonipin prior to admit.   5. Elevated LFTs Suspect in the setting of shock.    Length of Stay: 1  Amy Clegg, NP  02/06/2020, 8:47 AM  Advanced Heart Failure Team Pager [phone] (M-F; 7a - 4p)  Please contact [facility] for night-coverage after hours (4p -7a ) and weekends on [email or web address]  Patient seen with NP, agree with the above note.   History as above, concern for stress (Takotsubo-type) cardiomyopathy with severely decreased EF and recurrent torsades.  Multiple shocks.  Attempt to RV pace triggered torsades.    She was taken to cath lab for [facility] placement and evaluation for mechanical support to offload LV and decrease trigger for VT.    She remains intubated.  CXR with bilateral patchy infiltrates.   RHC: Procedural Findings (on norepinephrine 4): Hemodynamics (mmHg) RA mean 12 RV 31/13 PA 33/19, mean 24 PCWP mean 17 Oxygen saturations: PA 73% AO 100% Cardiac Output (Thermo) 3.53 Cardiac Index (Thermo) 1.95 PVR 2 WU Cardiac Output  (Fick) 4.95 Cardiac Index (Fick) 2.74  Low cardiac output on RHC by thermodilution though preserved by Fick.  Patient on norepinephrine 4.  I elected to place IABP to offload LV and decrease drive for VT.   General: Sedated on vent.  Neck: JVP 9-10 cm, no thyromegaly or thyroid nodule.  Lungs: Crackles bilaterally.  CV: Nondisplaced PMI.  Heart regular S1/S2, no S3/S4, no murmur.  No peripheral edema.   Abdomen: Soft, nontender, no hepatosplenomegaly, no distention.  Skin: Intact without lesions or rashes.  Neurologic: Sedated on vent.  Extremities:  No clubbing or cyanosis.  HEENT: Normal.   Assessment/Plan: 1. VT: Recurrent torsades with multiple shocks, also triggered by RV pacing from temporary wire. Coronaries normal.  Amiodarone stopped due to prolonged QT interval, now on lidocaine 2.  I suspect that prolonged QT is the  sequelae of stress (Takotsubo-type) cardiomyopathy.  - Continue lidocaine gtt.  - Manage electrolytes.  - I removed the RV pacing wire.  - Discussed with Dr. Quentin Ore, can place RA wire for overdrive pacing if needed in future.  - IABP placed to offload LV and decrease drive to VT.  2. Acute systolic CHF/cardiogenic shock: EF < 20%, initial cath with normal coronaries.  Concern for stress (Takotsubo-type) cardiomyopathy.  Recent emotional stress.  Cardiac output low by thermodilution on NE 4, preserved by Fick.  As above, elected to place IABP to offload LV.  - IABP placed.  - Follow CVP, elevated at 12 => hopefully will come down with IABP.  - Wean NE as able.  - Needs formal echo. With elevated D-dimer, need to rule out LV thrombus.  - Will need eventual cardiac MRI.  3. Neuro: Was following commands this morning.  Was not cooled.  4. Acute hypoxemic respiratory failure: On vent.  CXR with bilateral patchy infiltrates, ?aspiration in setting of cardiac arrest.  - CCM following.  5. Elevated LFTs: Suspect shock liver.   Loralie Champagne 02/06/2020 11:16  AM

## 2020-02-06 NOTE — Procedures (Signed)
Cardioversion & Cardiopulmonary Resuscitation Procedure Note  Adalay Azucena  [id number]  July 07, 1965  Date:02/06/20  Time:9:05 AM   Provider Performing:Jocelyn Nold L Turon Kilmer   Procedure: Cardiopulmonary Resuscitation (92950)  Indication(s) Loss of Pulse, VT   Consent N/A  Anesthesia N/A  Time Out N/A  Sterile Technique Hand hygiene, gloves  Procedure Description Called to patient's room for VT. Initial rhythm was Vfib/Vtach. Patient received no chest compressions with defibrillation X 120J. Additional pharmacologic interventions included lidocaine continuous at 2mg . Return of spontaneous circulation was achieved.  Family at bedside.  Complications/Tolerance N/A  EBL N/A  Specimen(s) N/A  Estimated time to ROSC:    , DO [facility] 02/06/2020 9:08 AM

## 2020-02-06 NOTE — Progress Notes (Signed)
At 11:40, CO 4.04 CI 2.23. Did not flow into Epic flowsheets from monitor.

## 2020-02-06 NOTE — Interval H&P Note (Signed)
History and Physical Interval Note:  02/06/2020 9:51 AM  Kristina Nichols  has presented today for surgery, with the diagnosis of urgent.  The various methods of treatment have been discussed with the patient and family. After consideration of risks, benefits and other options for treatment, the patient has consented to  Procedure(s): RIGHT HEART CATH (N/A) as a surgical intervention.  The patient's history has been reviewed, patient examined, no change in status, stable for surgery.  I have reviewed the patient's chart and labs.  Questions were answered to the patient's satisfaction.     [name]

## 2020-02-06 NOTE — Progress Notes (Signed)
  RHC today on Norepi 4 mcg.  RA mean 12 RV 31/13 PA 33/19, mean 24 PCWP mean 17 Oxygen saturations: PA 73% AO 100% Cardiac Output (Thermo) 3.53 Cardiac Index (Thermo) 1.95 PVR 2 WU Cardiac Output (Fick) 4.95 Cardiac Index (Fick) 2.74   IABP 1:1 inserted   Norepi weaned to 2 mcg with drop in CO/CI. --->Brief run Torsades  PAP: (17-26)/(10-20) 18/13 CVP:  [6 mmHg-46 mmHg] 7 mmHg CO:  [3.1 L/min] 3.1 L/min CI:  [1.7 L/min/m2] 1.7 L/min/m2    Increase Norepi back to 3 mcg and repeat swan numbers in 1 hour.    Kristina Dekay NP-C /1:50 PM

## 2020-02-06 NOTE — Progress Notes (Signed)
ANTICOAGULATION CONSULT NOTE - Initial Consult  Pharmacy Consult for heparin  Indication: IABP  No Known Allergies  Patient Measurements: Height: 5\' 5"  (165.1 cm) Weight: 73.4 kg (161 lb 13.1 oz) IBW/kg (Calculated) : 57 Heparin Dosing Weight: 70kg  Vital Signs: Temp: 99 F (37.2 C) (10/25 0915) Temp Source: Esophageal (10/25 0800) BP: 119/82 (10/25 1038) Pulse Rate: 69 (10/25 0915)  Labs: Recent Labs    02/05/20 1745 02/05/20 1745 02/05/20 1807 02/05/20 1853 02/05/20 1853 02/05/20 1929 02/05/20 2107 02/05/20 2225 02/06/20 0214  HGB 13.3  --    < > 11.9*   < >  --   --  12.5 13.2  HCT 42.7  --    < > 35.0*  --   --   --  39.7 40.6  PLT 180  --   --   --   --   --   --  203 215  LABPROT 12.9  --   --   --   --   --   --   --   --   INR 1.0  --   --   --   --   --   --   --   --   CREATININE 1.18*  --    < >  --   --   --  0.83 0.94 0.90  TROPONINIHS 33*   < >  --   --   --  88* 119* 232*  --    < > = values in this interval not displayed.    Estimated Creatinine Clearance: 74.2 mL/min (by C-G formula based on SCr of 0.9 mg/dL).   Medical History: Past Medical History:  Diagnosis Date  . Anxiety   . Depression   . Hyperlipidemia   . Hypothyroidism   . Insomnia     Assessment: 54 year old female presenting to [facility] with vfib cardiac arrest. Patient has continued to have vfib this morning. Patient taken urgently to cath lab and IABP was placed. Orders to start IV heparin. CBC within normal limits  Goal of Therapy:  Heparin level 0.2-0.5 units/ml Monitor platelets by anticoagulation protocol: Yes   Plan:  Start heparin infusion at 850 units/hr Check anti-Xa level in 6 hours and daily while on heparin Continue to monitor H&H and platelets  [facility] PharmD., BCPS Clinical Pharmacist 02/06/2020 12:47 PM

## 2020-02-06 NOTE — Progress Notes (Signed)
   02/06/20 0500  Clinical Encounter Type  Visited With Patient;Health care provider  Visit Type Code  Referral From Nurse  Consult/Referral To Chaplain  The chaplain responded to code blue page. No family present. The code blue was successful. No chaplain services needed.

## 2020-02-06 NOTE — Progress Notes (Addendum)
NAME:  Kristina Nichols, MRN:  [id number], DOB:  11/20/1965, LOS: 1 ADMISSION DATE:  02/05/2020, CONSULTATION DATE:  02/05/20 REFERRING MD: Freida Busman (ED), CHIEF COMPLAINT:  Cardiac arrest   Brief History   54 yo woman here after witnessed cardiac arrest at home with refractory vib/ torsades.  Intubated in ER.  Taken for [facility] with clean coronaries, findings consistent with stress induced cardiomyopathy, LVEF < 25%.     History of present illness   Witnessed loss of consciousness, possibly seizure activity by her husband.   7 min of CPR reported, including Chest compressions done by her husband who is reportedly an EMT.  EMS cardioverted her and transported her, reportedly gave her sedating medication for agitation and reaching for the NP airway in route.  She was immediately intubated in the ED for airway protection.  Initial work up was done, relatively stable initially.  Started on propofol  Developed refractory vfib/torsades in the ED.   Multiple cardioversions.   Mag 2gm given x 2 Lidocaine 100mg  given x1. Started on amiodarone gtt.  Per nurse received a total of 2L NS.  Tox screen pos for amphetamines, benzodiazepenes.  Mild hypotension, MAP mid 60s, following arrest. Cardiology has evaluated, patient being taken to cath lab for coronary angiography.    Past Medical History  HLD Depression/Anxiety - Klonopin, lamictal GERD Hypothyroidism - stopped med 08/2017 per last clinic note Insomnia - trazodone Hearing loss Endometriosis Gastroparesis Lymphedema Sp cholecystectomy, hyterectomy DOE 08/2017  Meds: wellbutrin, klonopin, neuronitn, advil, lamictal 100BID, omeprazole, levothyroxine, vyvanse 60 daily  Significant Hospital Events   10/24 Admitted   Consults:  Cardiology   Procedures:  10/24 ETT >> 10/24 R femoral sheath >>  Significant Diagnostic Tests:  10/24 [facility] >> neg  Cardiac cath 10/24 >>  There is severe left ventricular systolic dysfunction.  LV end diastolic  pressure is moderately elevated.  The left ventricular ejection fraction is less than 25% by visual estimate.  There is no mitral valve regurgitation.   1. No angiographic evidence of CAD 2. Severe segmental LV systolic dysfunction with wall motion suggesting Takotsubo's cardiomyopathy (stress induced cardiomyopathy).  3. Elevated LVEDP 4. Ventricular fibrillation/Torsades-Temporary Pacemaker placed per request of admitting/consulting team in case of need of overdrive pacing tonight  10/25 TTE >>  Micro Data:  10/24 SARS2 >> neg 10/24 MRSA PCR >> neg  Antimicrobials:  n/a  Interim history/subjective:  Brief torsades arrest this morning requiring CPR [phone] and defibrillated several other times without CPR, improved once on lidocaine gtt, currently at 2mg /min.  Remains on NE 74mcg/min and propofol 20 mcg/kg/min  Objective   Blood pressure 103/78, pulse 74, temperature 98.8 F (37.1 C), resp. rate (!) 22, height 5\' 5"  (1.651 m), weight 73.4 kg, SpO2 100 %.    Vent Mode: PRVC FiO2 (%):  [70 %-100 %] 70 % Set Rate:  [18 bmp-22 bmp] 22 bmp Vt Set:  [450 mL] 450 mL PEEP:  [5 cmH20] 5 cmH20 Plateau Pressure:  [22 cmH20-29 cmH20] 29 cmH20   Intake/Output Summary (Last 24 hours) at 02/06/2020 0716 Last data filed at 02/06/2020 0600 Gross per 24 hour  Intake 525.45 ml  Output 4165 ml  Net -3639.55 ml   Filed Weights   02/05/20 1800 02/06/20 0000  Weight: 70 kg 73.4 kg   Examination: General:  Critically ill adult female lying in bed in NAD HEENT: MM pink/moist, pupils 4/reactive, anicteric, ETT/ OGT  Neuro:  RASS -1/ follows simple commands in all extremities CV: SR with occasional  PVC- more when stimulated, no murmur, right femoral venous sheath remains in place with pacer wires PULM:  MV supported breaths, coarse throughout- faint scattered basilar crackles, minimal secretions GI: soft, bs hypo, foley- cyu Extremities: cool/dry, trace LE edema  Skin: no rashes    Resolved Hospital Problem list     Assessment & Plan:   Recurrent Vfib/ Torsades cardiac arrest secondary to Takotsubo's cardiomyopathy with EF <25% - LHC 10/25 with clean coronaries - patient has been under major recent stress due to critical illness of her son P:  Tele monitoring  Appreciate cardiology input Amio stopped due to prolonged QTc Venous sheath and pacing wires ongoing per cards  Continue lidocaine gtt per cards Goal Mag > 2, K > 4 (getting am replete) TTE this am  Continue peripheral NE for MAP goal >65 PICC ordered- for better access, multiple drips, and will check/ trend coox/ CVP TSH 7.724 Avoid QTc prolonging agents  Strict I/Os Trend BMET, recheck at noon Recheck lactic acid   Addendum: patient had additional episode of Torsades s/p defib (see separate note).  HF and EP consulted with plans to take patient back to cath lab for PA catheter, RHC, and possibly movement of temp pacer from RV to RA +/- ventricular support device  Acute encephalopathy after cardiac arrest - follow commands 10/25 am  P:  Continue neuro checks Avoid fevers  PAD protocol with propofol and prn fentanyl for RASS goal -3/-4  Acute Hypoxic respiratory failure related to above Pulmonary edema  P:  Full MV support, PRVC  Continue to wean FiO2 for sat goal > 94% Intermittent CXR/ ABG VAP bundle S/p diuresis overnight, net neg 3.4L Check ABG now with iCa  Hx anxiety/ depression - no concern from husband over accidental/ intentional OD P:  Holding home Wellbutrin and Lamictal  Pending lamictal level  Elevated Ddimer > 20 P:  TTE to evaluate for thrombus Consider LE doppler duplex   Hypocalcemia P:  S/p 1gm overnight.  Additional 1gm now and check iCa afterwards  Best practice:  Diet: NPO; start TF Pain/Anxiety/Delirium protocol (if indicated): Propofol/ prn fentanyl  VAP protocol (if indicated): yes DVT prophylaxis: lovenox GI prophylaxis: protonix Glucose control:  add CBG q 4/ SSI sensitive Mobility: bed Code Status: Full Family Communication: husband at bedside updated  Disposition: ICU  Labs   CBC: Recent Labs  Lab 02/05/20 1745 02/05/20 1807 02/05/20 1853 02/05/20 2225 02/06/20 0214  WBC 8.1  --   --  8.7 8.9  HGB 13.3 13.9  13.9 11.9* 12.5 13.2  HCT 42.7 41.0  41.0 35.0* 39.7 40.6  MCV 99.3  --   --  101.0* 96.2  PLT 180  --   --  203 215    Basic Metabolic Panel: Recent Labs  Lab 02/05/20 1745 02/05/20 1745 02/05/20 1806 02/05/20 1807 02/05/20 1853 02/05/20 1929 02/05/20 2107 02/05/20 2225 02/06/20 0214  NA 139   < >  --  140  139 141  --  137 137 139  K 3.9   < >  --  3.8  3.8 3.7  --  3.9 4.9 3.9  CL 107  --   --  105  --   --  110 109 108  CO2 21*  --   --   --   --   --  19* 22 25  GLUCOSE 182*  --   --  185*  --   --  208* 199* 144*  BUN 18  --   --  22*  --   --  16 16 14   CREATININE 1.18*  --   --  1.20*  --   --  0.83 0.94 0.90  CALCIUM 8.7*  --   --   --   --   --  6.3* 7.0* 7.8*  MG  --   --  2.0  --   --  4.4* 3.3*  --  2.6*  PHOS  --   --   --   --   --   --   --   --  2.3*   < > = values in this interval not displayed.   GFR: Estimated Creatinine Clearance: 74.2 mL/min (by C-G formula based on SCr of 0.9 mg/dL). Recent Labs  Lab 02/05/20 1745 02/05/20 1937 02/05/20 2225 02/06/20 0110 02/06/20 0214  WBC 8.1  --  8.7  --  8.9  LATICACIDVEN 3.8* 2.4* 1.3 1.9  --     Liver Function Tests: Recent Labs  Lab 02/05/20 2225  AST 898*  ALT 482*  ALKPHOS 75  BILITOT 0.8  PROT 5.2*  ALBUMIN 3.1*   No results for input(s): LIPASE, AMYLASE in the last 168 hours. No results for input(s): AMMONIA in the last 168 hours.  ABG    Component Value Date/Time   PHART 7.298 (L) 02/05/2020 1853   PCO2ART 52.4 (H) 02/05/2020 1853   PO2ART 255 (H) 02/05/2020 1853   HCO3 25.7 02/05/2020 1853   TCO2 27 02/05/2020 1853   ACIDBASEDEF 1.0 02/05/2020 1853   O2SAT 100.0 02/05/2020 1853     Coagulation  Profile: Recent Labs  Lab 02/05/20 1745  INR 1.0    Cardiac Enzymes: No results for input(s): CKTOTAL, CKMB, CKMBINDEX, TROPONINI in the last 168 hours.  HbA1C: No results found for: HGBA1C  CBG: Recent Labs  Lab 02/05/20 1745  GLUCAP 184*    Critical care time: 40 minutes     02/07/20, ACNP [facility] 02/06/2020, 8:23 AM  See 02/08/2020 for personal pager PCCM on call pager [phone]   Pulmonary critical care attending:  This is a 54 year old female which presented in cardiac arrest, V. fib and torsades, VT.  Patient found to have concern of stress-induced cardiomyopathy with ejection fraction less than 25%.  She had a left heart catheterization on 02/06/2020 with clean coronaries.  On evaluation in the ICU this morning.  Patient had additional episodes of VT requiring cardioversion and initiation of ACLS. Multidisciplinary discussion at bedside with electrophysiology, advanced heart failure and critical care occurred.  Decision was made to take patient to Cath Lab for [facility] placement repositioning of pacer wire to the atria and consideration for LV support device.  BP 119/82   Pulse 69   Temp 99 F (37.2 C)   Resp (!) 22   Ht 5\' 5"  (1.651 m)   Wt 73.4 kg   LMP  (LMP Unknown)   SpO2 100%   BMI 26.93 kg/m   General: Middle-aged female intubated on mechanical life support HEENT: NCAT, open eyes to voice with sedation lightened Lungs: Bilateral mechanically ventilated breath sounds Heart: Regular rhythm S1-S2  labs: Reviewed Chest x-ray: Bilateral infiltrates consistent with pulmonary edema The patient's images have been independently reviewed by me.    Assessment: Cardiogenic shock Acute systolic heart failure Stress-induced cardiomyopathy Left heart cath with clean coronaries no evidence of obstruction Recurrent ventricular tachycardia, torsades status post cardioversion Brief VT mediated cardiac arrest with 2 minutes of  CPR. Acute hypoxemic respiratory  failure requiring intubation mechanical ventilation secondary to above Bilateral pulmonary edema  Plan: Continue IV lidocaine Continue low-dose norepinephrine Multidisciplinary discussion on next steps with electrophysiology plus advanced heart failure. Cath Lab for Swan-Ganz catheter placement Depending on output numbers may need to consider LV support device. Titrate vasopressors to maintain mean arterial pressure greater than 65 Follow lactate Check COOX once Swan in place Goal electrolytes, potassium greater than 4, mag greater than 2  We will reassess once back from the lab.  Please see separate documentation for cardioversion/ACLS.  This patient is critically ill with multiple organ system failure; which, requires frequent high complexity decision making, assessment, support, evaluation, and titration of therapies. This was completed through the application of advanced monitoring technologies and extensive interpretation of multiple databases. During this encounter critical care time was devoted to patient care services described in this note for 80 minutes.  Josephine Igo, DO [facility] 02/06/2020 10:44 AM

## 2020-02-06 NOTE — Progress Notes (Signed)
While communicating with the patient and completing oral care and repositioning, she went into Torsades rhythm.  Initiated CPR, defibrillated x 1 with ROSC.  Afterwards, pt remained able to follow commands.  Dr. Jayme Cloud and Dr. Mayford Knife both notified, see orders.    Pt's husband also updated.  Since the above mentioned episode, she has entered into Torsades three additional times, converting back into SR with a single defibrillation.  Dr. Mayford Knife aware.

## 2020-02-06 NOTE — Progress Notes (Signed)
eLink Physician-Brief Progress Note Patient Name: Kristina Nichols DOB: 01-31-1966 MRN: [id number]   Date of Service  02/06/2020  HPI/Events of Note  Earlier had a brief code. RN was speaking to her and she went into torsades and had 1 round CPR with 1 defib and back in SR and following commands again. AM labs are stable.  Camera : VS stable. In synchrony.   eICU Interventions  - continue to watch. Cards to follow through.       Intervention Category Intermediate Interventions: Arrhythmia - evaluation and management  Ranee Gosselin 02/06/2020, 5:14 AM

## 2020-02-06 NOTE — Progress Notes (Signed)
Assisted with transport to cath lab. No noted respiratory issues.

## 2020-02-06 NOTE — Progress Notes (Signed)
IABP began alarming air leak.  No obvious connection issues.  Assessed by Liborio Nixon RN and Wynona Canes, RN.  Helium tank was replaced without cessation of alarms. Thereasa Distance from cath lab was called and he replaced the tubing from the IABP to the patient.  No further alarms noted.

## 2020-02-06 NOTE — Progress Notes (Addendum)
Initial Nutrition Assessment  DOCUMENTATION CODES:   Not applicable  INTERVENTION:   Initiate tube feeding via OG tube: Vital AF 1.2 at 40 ml/h (960 ml per day) Prosource TF 45 ml QID  Provides 1312 kcal (1756 kcal total with propofol), 116 gm protein, 779 ml free water daily  NUTRITION DIAGNOSIS:   Inadequate oral intake related to inability to eat as evidenced by NPO status.  GOAL:   Patient will meet greater than or equal to 90% of their needs  MONITOR:   Vent status, Labs, TF tolerance, Skin  REASON FOR ASSESSMENT:   Ventilator, Consult Enteral/tube feeding initiation and management  ASSESSMENT:   54 yo female admitted after witnessed cardiac arrest at home. Required intubation in the ED. PMH includes HLD, depression, GERD, hypothyroidism, gastroparesis, lymphedema.   Discussed patient in ICU rounds and with RN today. L heart cath results c/w stress induced cardiomyopathy.  S/P cardiac cath this morning, patient not in room. Received MD consult for assessment, discussed with MD and okay to begin TF today.  Patient is currently intubated on ventilator support MV: 9.6 L/min Temp (24hrs), Avg:97.1 F (36.2 C), Min:93.2 F (34 C), Max:99.5 F (37.5 C)  Propofol: 16.8 ml/hr providing 444 kcal from lipid MAP range >/= 64 this morning  Labs reviewed.  CBG: 118  Medications reviewed and include levophed, miralax, novolog, colace.  Diet Order:   Diet Order            Diet NPO time specified  Diet effective now                 EDUCATION NEEDS:   Not appropriate for education at this time  Skin:  Skin Assessment: Reviewed RN Assessment  Last BM:  unknown  Height:   Ht Readings from Last 1 Encounters:  02/05/20 5\' 5"  (1.651 m)    Weight:   Wt Readings from Last 1 Encounters:  02/06/20 73.4 kg    BMI:  Body mass index is 26.93 kg/m.  Estimated Nutritional Needs:   Kcal:  1650  Protein:  110-125 gm  Fluid:  >/= 1.8 L    02/08/20, RD, LDN, CNSC Please refer to Amion for contact information.

## 2020-02-07 ENCOUNTER — Inpatient Hospital Stay (HOSPITAL_COMMUNITY): Payer: BC Managed Care – PPO

## 2020-02-07 DIAGNOSIS — J9690 Respiratory failure, unspecified, unspecified whether with hypoxia or hypercapnia: Secondary | ICD-10-CM

## 2020-02-07 DIAGNOSIS — I5021 Acute systolic (congestive) heart failure: Secondary | ICD-10-CM

## 2020-02-07 DIAGNOSIS — R7989 Other specified abnormal findings of blood chemistry: Secondary | ICD-10-CM

## 2020-02-07 DIAGNOSIS — J9601 Acute respiratory failure with hypoxia: Secondary | ICD-10-CM

## 2020-02-07 LAB — GLUCOSE, CAPILLARY
Glucose-Capillary: 107 mg/dL — ABNORMAL HIGH (ref 70–99)
Glucose-Capillary: 67 mg/dL — ABNORMAL LOW (ref 70–99)
Glucose-Capillary: 70 mg/dL (ref 70–99)
Glucose-Capillary: 134 mg/dL — ABNORMAL HIGH (ref 70–99)
Glucose-Capillary: 144 mg/dL — ABNORMAL HIGH (ref 70–99)
Glucose-Capillary: 129 mg/dL — ABNORMAL HIGH (ref 70–99)

## 2020-02-07 LAB — COMPREHENSIVE METABOLIC PANEL
ALT: 210 U/L — ABNORMAL HIGH (ref 0–44)
AST: 119 U/L — ABNORMAL HIGH (ref 15–41)
Albumin: 2.8 g/dL — ABNORMAL LOW (ref 3.5–5.0)
Alkaline Phosphatase: 67 U/L (ref 38–126)
Anion gap: 6 (ref 5–15)
BUN: 12 mg/dL (ref 6–20)
CO2: 22 mmol/L (ref 22–32)
Calcium: 7.7 mg/dL — ABNORMAL LOW (ref 8.9–10.3)
Chloride: 108 mmol/L (ref 98–111)
Creatinine, Ser: 0.64 mg/dL (ref 0.44–1.00)
GFR, Estimated: >60 mL/min (ref >60)
Glucose, Bld: 142 mg/dL — ABNORMAL HIGH (ref 70–99)
Potassium: 4.2 mmol/L (ref 3.5–5.1)
Sodium: 136 mmol/L (ref 135–145)
Total Bilirubin: 0.6 mg/dL (ref 0.3–1.2)
Total Protein: 4.9 g/dL — ABNORMAL LOW (ref 6.5–8.1)

## 2020-02-07 LAB — BASIC METABOLIC PANEL
Anion gap: 7 (ref 5–15)
BUN: 16 mg/dL (ref 6–20)
CO2: 22 mmol/L (ref 22–32)
Calcium: 8 mg/dL — ABNORMAL LOW (ref 8.9–10.3)
Chloride: 107 mmol/L (ref 98–111)
Creatinine, Ser: 0.73 mg/dL (ref 0.44–1.00)
GFR, Estimated: >60 mL/min (ref >60)
Glucose, Bld: 124 mg/dL — ABNORMAL HIGH (ref 70–99)
Potassium: 3.7 mmol/L (ref 3.5–5.1)
Sodium: 136 mmol/L (ref 135–145)

## 2020-02-07 LAB — CBC
HCT: 37.2 % (ref 36.0–46.0)
Hemoglobin: 12.1 g/dL (ref 12.0–15.0)
MCH: 31.3 pg (ref 26.0–34.0)
MCHC: 32.5 g/dL (ref 30.0–36.0)
MCV: 96.1 fL (ref 80.0–100.0)
Platelets: 143 10*3/uL — ABNORMAL LOW (ref 150–400)
RBC: 3.87 MIL/uL (ref 3.87–5.11)
RDW: 13.8 % (ref 11.5–15.5)
WBC: 9.7 10*3/uL (ref 4.0–10.5)
nRBC: 0 % (ref 0.0–0.2)

## 2020-02-07 LAB — COOXEMETRY PANEL
Carboxyhemoglobin: 0.6 % (ref 0.5–1.5)
Methemoglobin: 1.1 % (ref 0.0–1.5)
O2 Saturation: 75.9 %
Total hemoglobin: 12.4 g/dL (ref 12.0–16.0)

## 2020-02-07 LAB — LIDOCAINE LEVEL: Lidocaine Lvl: 3.8 ug/mL (ref 1.5–5.0)

## 2020-02-07 LAB — PROCALCITONIN: Procalcitonin: 0.1 ng/mL

## 2020-02-07 LAB — MAGNESIUM
Magnesium: 2.2 mg/dL (ref 1.7–2.4)
Magnesium: 2.3 mg/dL (ref 1.7–2.4)

## 2020-02-07 LAB — CALCIUM, IONIZED: Calcium, Ionized, Serum: 4.4 mg/dL — ABNORMAL LOW (ref 4.5–5.6)

## 2020-02-07 LAB — HEMOGLOBIN A1C
Hgb A1c MFr Bld: 5.3 % (ref 4.8–5.6)
Mean Plasma Glucose: 105 mg/dL

## 2020-02-07 LAB — HEPARIN LEVEL (UNFRACTIONATED): Heparin Unfractionated: 0.26 IU/mL — ABNORMAL LOW (ref 0.30–0.70)

## 2020-02-07 LAB — LAMOTRIGINE LEVEL: Lamotrigine Lvl: 6 ug/mL (ref 2.0–20.0)

## 2020-02-07 IMAGING — DX DG CHEST 1V PORT
1 series · 1 of 1 positions shown · non-contrast
Comparison: Earlier radiograph dated [DATE].

CLINICAL DATA: 53-year-old female with heart failure.

EXAM:
PORTABLE CHEST 1 VIEW

[chest ap]
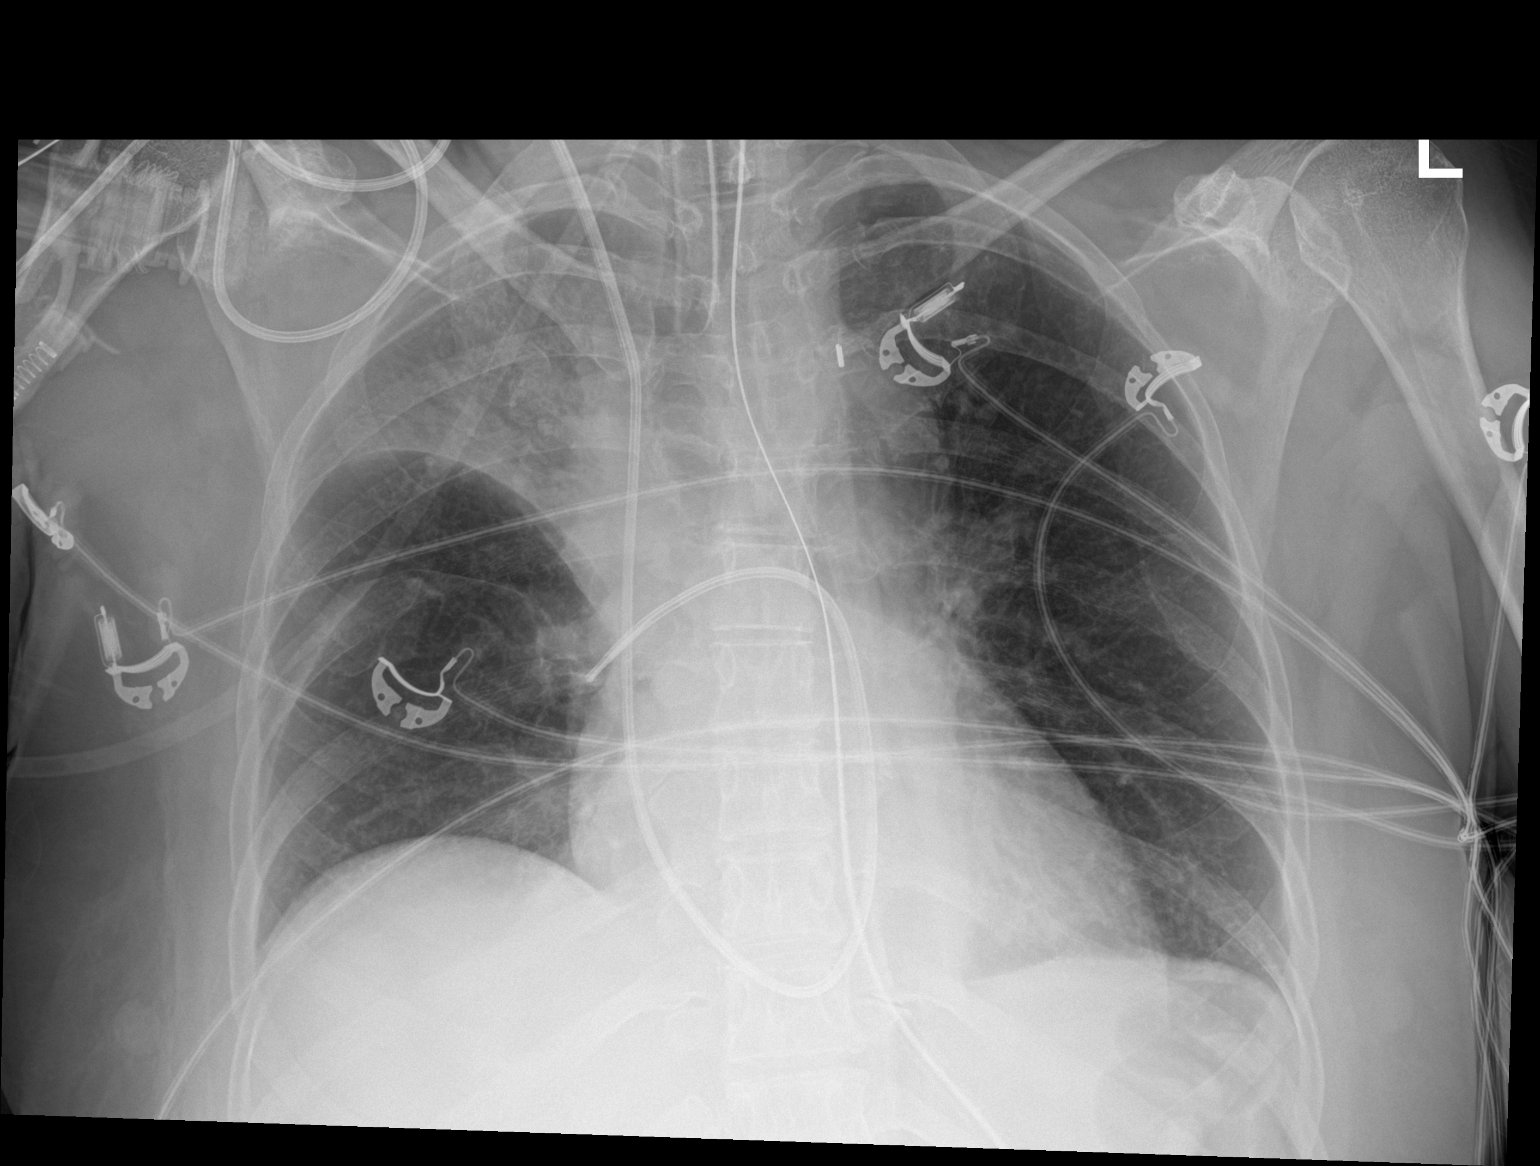

[1 of 1 positions shown; findings below may reference images not displayed]

FINDINGS: Endotracheal tube, enteric tube, and MAYABEL in similar position.

There has been interval development of an area of opacity in the
right upper lung field with overall decreased volume consistent with
lobar atelectasis or collapse. Clinical correlation is recommended.
No large pleural effusion. No pneumothorax. Stable cardiac
silhouette. No acute osseous pathology.
IMPRESSION: Interval development of lobar atelectasis or collapse of the right
upper lung field. Follow-up recommended.

## 2020-02-07 IMAGING — DX DG CHEST 1V PORT
1 series · 1 of 1 positions shown · non-contrast
Comparison: [DATE]

CLINICAL DATA: Post cardiac arrest.

EXAM:
PORTABLE CHEST 1 VIEW

[chest ap]
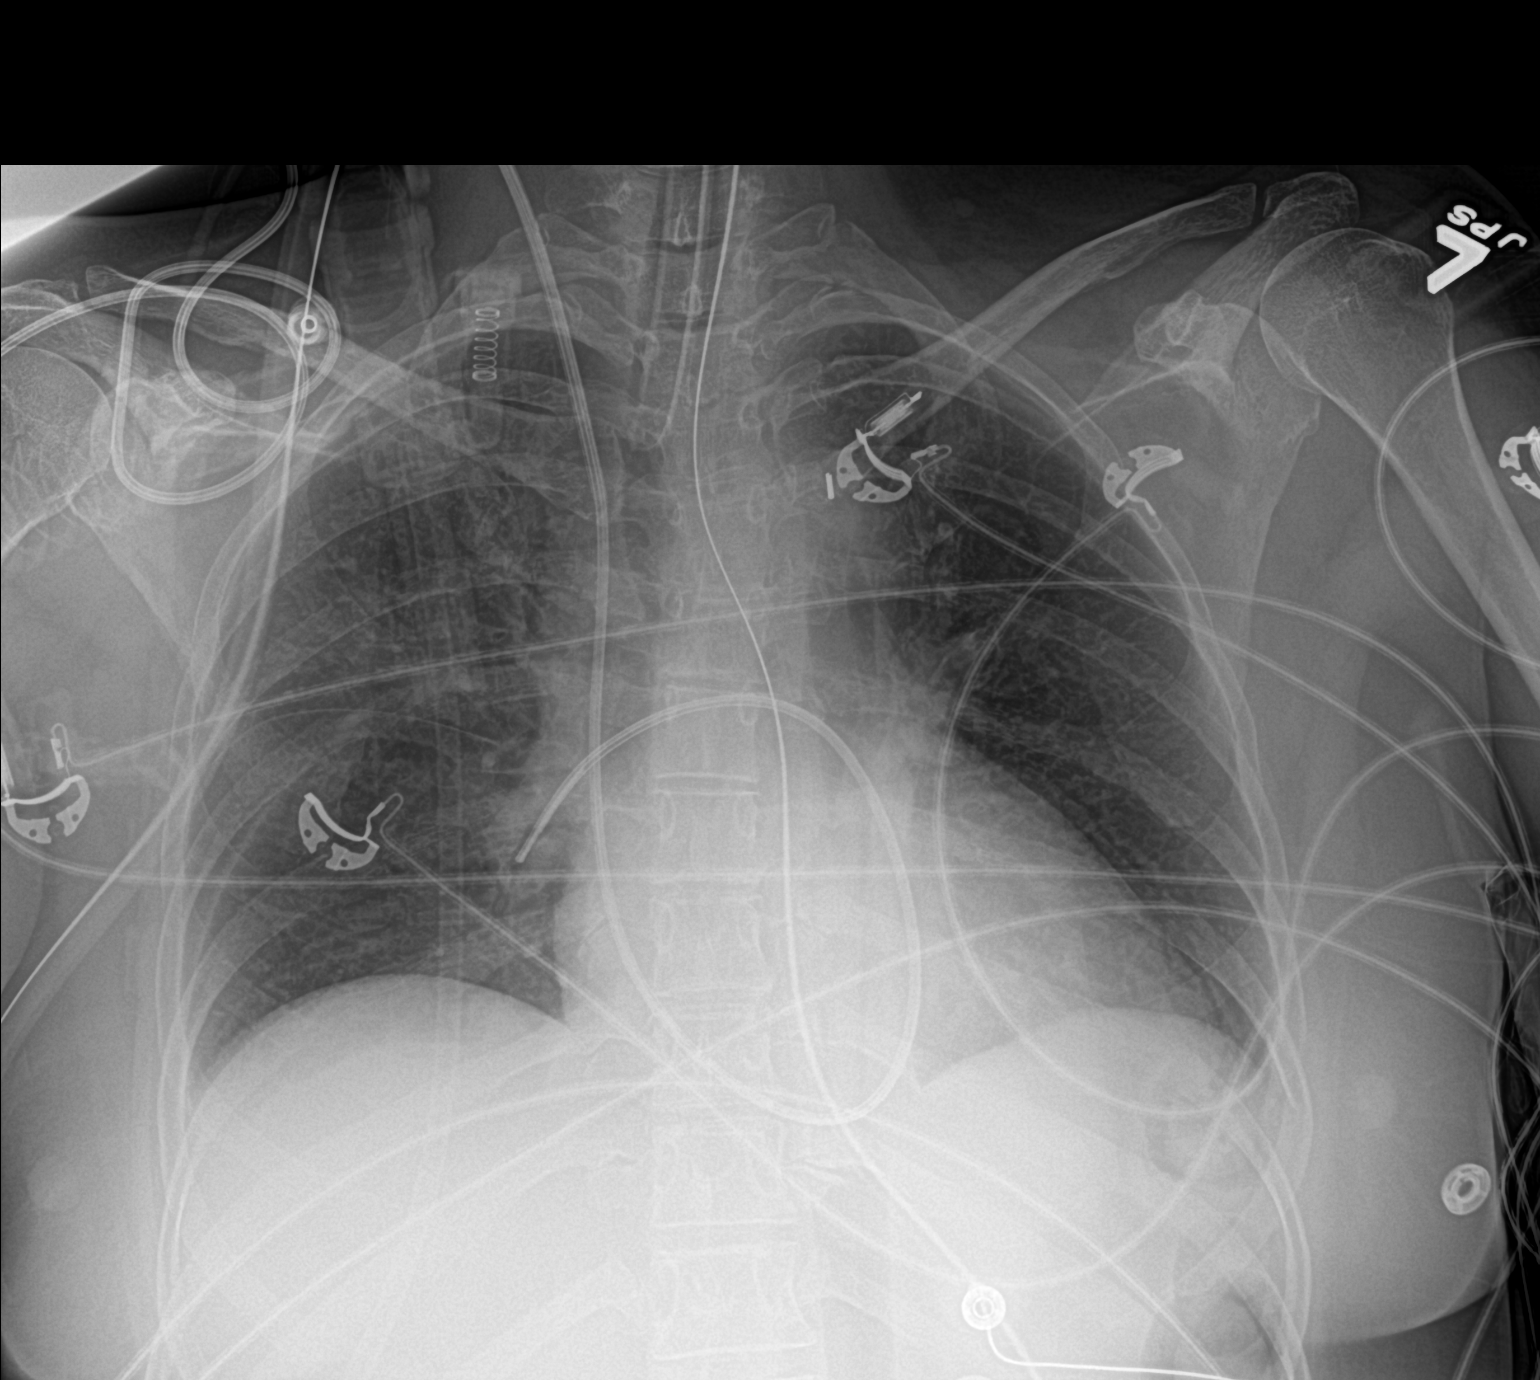

[1 of 1 positions shown; findings below may reference images not displayed]

FINDINGS: Endotracheal tube in good position. NG tube enters the stomach with
the tip not visualized.

Involve placement of right jugular Swan-Ganz catheter. Swan-Ganz
catheter tip right lower lobe pulmonary artery. No pneumothorax.

Improvement in bilateral infiltrates compared with the prior study.
No effusion.
IMPRESSION: Swan-Ganz catheter tip right lower lobe pulmonary artery. No
pneumothorax.

Improvement in bilateral airspace disease likely edema.

## 2020-02-07 MED ORDER — FAMOTIDINE 40 MG/5ML PO SUSR
20.0000 mg | Freq: Two times a day (BID) | ORAL | Status: DC
  Administered 2020-02-07: 20 mg via ORAL
  Filled 2020-02-07: qty 2.5

## 2020-02-07 MED ORDER — FAMOTIDINE 40 MG/5ML PO SUSR
20.0000 mg | Freq: Two times a day (BID) | ORAL | Status: DC
  Administered 2020-02-07 – 2020-02-09 (×4): 20 mg
  Filled 2020-02-07 (×4): qty 2.5

## 2020-02-07 MED ORDER — SPIRONOLACTONE 12.5 MG HALF TABLET
12.5000 mg | ORAL_TABLET | Freq: Every day | ORAL | Status: DC
  Administered 2020-02-07: 12.5 mg
  Filled 2020-02-07: qty 1

## 2020-02-07 MED ORDER — POTASSIUM CHLORIDE 20 MEQ PO PACK
20.0000 meq | PACK | Freq: Once | ORAL | Status: AC
  Administered 2020-02-07: 20 meq
  Filled 2020-02-07: qty 1

## 2020-02-07 MED ORDER — CARVEDILOL 3.125 MG PO TABS
3.1250 mg | ORAL_TABLET | Freq: Two times a day (BID) | ORAL | Status: DC
  Administered 2020-02-07 – 2020-02-09 (×4): 3.125 mg
  Filled 2020-02-07 (×4): qty 1

## 2020-02-07 MED ORDER — DIGOXIN 125 MCG PO TABS
0.1250 mg | ORAL_TABLET | Freq: Every day | ORAL | Status: DC
  Administered 2020-02-07 – 2020-02-08 (×2): 0.125 mg via ORAL
  Filled 2020-02-07 (×2): qty 1

## 2020-02-07 MED ORDER — DEXMEDETOMIDINE HCL IN NACL 400 MCG/100ML IV SOLN
0.4000 ug/kg/h | INTRAVENOUS | Status: DC
  Administered 2020-02-07: 0.4 ug/kg/h via INTRAVENOUS
  Filled 2020-02-07: qty 100

## 2020-02-07 MED ORDER — MAGNESIUM SULFATE 2 GM/50ML IV SOLN
2.0000 g | Freq: Once | INTRAVENOUS | Status: AC
  Administered 2020-02-07: 2 g via INTRAVENOUS
  Filled 2020-02-07: qty 50

## 2020-02-07 MED ORDER — CARVEDILOL 3.125 MG PO TABS
3.1250 mg | ORAL_TABLET | Freq: Two times a day (BID) | ORAL | Status: DC
  Administered 2020-02-07: 3.125 mg via ORAL
  Filled 2020-02-07: qty 1

## 2020-02-07 MED ORDER — POLYETHYLENE GLYCOL 3350 17 G PO PACK: 17.0000 g | PACK | Freq: Every day | ORAL | Status: DC | PRN

## 2020-02-07 MED ORDER — POTASSIUM CHLORIDE 20 MEQ/15ML (10%) PO SOLN
40.0000 meq | Freq: Once | ORAL | Status: AC
  Administered 2020-02-07: 40 meq
  Filled 2020-02-07: qty 30

## 2020-02-07 MED FILL — Medication: Qty: 1 | Status: AC

## 2020-02-07 NOTE — Progress Notes (Addendum)
Advanced Heart Failure Rounding Note  PCP-Cardiologist: No primary care provider on file.   Subjective:    10/25 S/P IABP.  Temp pacer removed.   IABP 1:1   On Lidocaine 2 mcg + Norepi 3 mcg. Lidocaine level 3.8   Remains intubated. Wakes up.   CO-OX 76% PAP: (14-26)/(7-20) 23/15 CVP:  [2 mmHg-79 mmHg] 6 mmHg PCWP:  [14 mmHg] 14 mmHg CO:  [3.1 L/min-4.1 L/min] 4.1 L/min CI:  [1.7 L/min/m2-2.3 L/min/m2] 2.3 L/min/m2   Objective:   Weight Range: 74.3 kg Body mass index is 27.26 kg/m.   Vital Signs:   Temp:  [98.4 F (36.9 C)-100.8 F (38.2 C)] 99.9 F (37.7 C) (10/26 0600) Pulse Rate:  [60-77] 70 (10/26 0600) Resp:  [15-29] 22 (10/26 0600) BP: (84-164)/(53-106) 126/90 (10/26 0600) SpO2:  [100 %] 100 % (10/26 0600) FiO2 (%):  [40 %-60 %] 40 % (10/26 0442) Weight:  [74.3 kg] 74.3 kg (10/26 0500) Last BM Date:  (Unknown; PTA)  Weight change: Filed Weights   02/05/20 1800 02/06/20 0000 02/07/20 0500  Weight: 70 kg 73.4 kg 74.3 kg    Intake/Output:   Intake/Output Summary (Last 24 hours) at 02/07/2020 6503 Last data filed at 02/07/2020 0600 Gross per 24 hour  Intake 2221.49 ml  Output 2360 ml  Net -138.51 ml      Physical Exam   CVP 7-8 General:  Intubated HEENT: ETT  Neck: Supple. JVP 6-7 . Carotids 2+ bilat; no bruits. No lymphadenopathy or thyromegaly appreciated. RIJ swan with erythema? Contact dermatitis.  Cor: PMI nondisplaced. Regular rate & rhythm. No rubs, gallops or murmurs. Zoll pads on.  Lungs: Clear Abdomen: Soft, nondistended. No hepatosplenomegaly. No bruits or masses. Good bowel sounds. Extremities: No cyanosis, clubbing, rash, edema. R Groin  Neuro:Intubated. Wakes up moves extremities.    Telemetry   SR with PVCs. No VT/torsades overnight.   EKG    N/a   Labs    CBC Recent Labs    02/06/20 0214 02/06/20 0214 02/06/20 1009 02/07/20 0048  WBC 8.9  --   --  9.7  HGB 13.2   < > 13.3  13.3 12.1  HCT 40.6   < > 39.0   39.0 37.2  MCV 96.2  --   --  96.1  PLT 215  --   --  143*   < > = values in this interval not displayed.   Basic Metabolic Panel Recent Labs    02/06/20 0214 02/06/20 1009 02/06/20 1543 02/07/20 0048  NA 139   < > 135 136  K 3.9   < > 3.8 3.7  CL 108   < > 104 107  CO2 25   < > 23 22  GLUCOSE 144*   < > 120* 124*  BUN 14   < > 13 16  CREATININE 0.90   < > 0.73 0.73  CALCIUM 7.8*   < > 8.0* 8.0*  MG 2.6*  --  2.3 2.2  PHOS 2.3*  --   --   --    < > = values in this interval not displayed.   Liver Function Tests Recent Labs    02/05/20 2225  AST 898*  ALT 482*  ALKPHOS 75  BILITOT 0.8  PROT 5.2*  ALBUMIN 3.1*   No results for input(s): LIPASE, AMYLASE in the last 72 hours. Cardiac Enzymes No results for input(s): CKTOTAL, CKMB, CKMBINDEX, TROPONINI in the last 72 hours.  BNP: BNP (last 3 results) No  results for input(s): BNP in the last 8760 hours.  ProBNP (last 3 results) No results for input(s): PROBNP in the last 8760 hours.   D-Dimer Recent Labs    02/05/20 1937  DDIMER >20.00*   Hemoglobin A1C Recent Labs    02/06/20 1241  HGBA1C 5.3   Fasting Lipid Panel Recent Labs    02/05/20 1806  TRIG 59   Thyroid Function Tests Recent Labs    02/05/20 2118  TSH 7.724*    Other results:   Imaging    CARDIAC CATHETERIZATION  Result Date: 02/06/2020 1. Mildly elevated left and right heart filling pressures. 2. Low cardiac output by thermodilution on norepinephrine 4, preserved output by Fick. 3. Successful IABP placement. 4. Swan left in place right IJ.   ECHOCARDIOGRAM COMPLETE  Result Date: 02/06/2020    ECHOCARDIOGRAM REPORT   Patient Name:   [facility] Date of Exam: 02/06/2020 Medical Rec #:  [id number]    Height:       65.0 in Accession #:    [id number]   Weight:       161.8 lb Date of Birth:  1965-04-26   BSA:          1.808 m Patient Age:    54 years     BP:           136/80 mmHg Patient Gender: F            HR:           64 bpm.  Exam Location:  Inpatient Procedure: 2D Echo Indications:    I46.9 cardiac arrest  History:        Patient has no prior history of Echocardiogram examinations.                 Risk Factors:Dyslipidemia. Temporary pacemaker.  Sonographer:    Jannett Celestine RDCS (AE) Referring Phys: [place]  Sonographer Comments: Suboptimal apical window and echo performed with patient supine and on artificial respirator. restricted mobility IMPRESSIONS  1. No LV thrombus visualized, thought cannot excluded given LV substrate. Left ventricular ejection fraction, by estimation, is <20%. The left ventricle has severely decreased function. The left ventricle demonstrates global hypokinesis. Left ventricular diastolic parameters are consistent with Grade I diastolic dysfunction (impaired relaxation).  2. Right ventricular systolic function is normal. The right ventricular size is normal.  3. The mitral valve is grossly normal. No evidence of mitral valve regurgitation.  4. The aortic valve was not well visualized. Aortic valve regurgitation is trivial.  5. Aortic IABP in Descending Aorta.  6. The inferior vena cava is dilated in size with <50% respiratory variability, suggesting right atrial pressure of 15 mmHg. Comparison(s): No prior Echocardiogram. Conclusion(s)/Recommendation(s): Severe LV Dysfunction; primary team aware. FINDINGS  Left Ventricle: No LV thrombus visualized, thought cannot excluded given LV substrate. Left ventricular ejection fraction, by estimation, is <20%. The left ventricle has severely decreased function. The left ventricle demonstrates global hypokinesis. The left ventricular internal cavity size was normal in size. There is no left ventricular hypertrophy. Left ventricular diastolic parameters are consistent with Grade I diastolic dysfunction (impaired relaxation). Right Ventricle: The right ventricular size is normal. No increase in right ventricular wall thickness. Right ventricular  systolic function is normal. Left Atrium: Left atrial size was normal in size. Right Atrium: Right atrial size was normal in size. Pericardium: There is no evidence of pericardial effusion. Mitral Valve: The mitral valve is grossly normal. No evidence of mitral valve regurgitation. Tricuspid  Valve: The tricuspid valve is not well visualized. Tricuspid valve regurgitation is not demonstrated. Aortic Valve: The aortic valve was not well visualized. Aortic valve regurgitation is trivial. Pulmonic Valve: The pulmonic valve was not well visualized. Pulmonic valve regurgitation is not visualized. Aorta: IABP in Descending Aorta and the aortic root is normal in size and structure. Venous: The pulmonary veins were not well visualized. The inferior vena cava is dilated in size with less than 50% respiratory variability, suggesting right atrial pressure of 15 mmHg. IAS/Shunts: The atrial septum is grossly normal.  LEFT VENTRICLE PLAX 2D LVIDd:         4.40 cm  Diastology LVIDs:         3.60 cm  LV e' medial:    5.98 cm/s LV PW:         0.80 cm  LV E/e' medial:  11.3 LV IVS:        0.70 cm  LV e' lateral:   6.53 cm/s LVOT diam:     2.20 cm  LV E/e' lateral: 10.4 LV SV:         54 LV SV Index:   30 LVOT Area:     3.80 cm  RIGHT VENTRICLE RV S prime:     10.00 cm/s TAPSE (M-mode): 2.0 cm LEFT ATRIUM         Index LA diam:    2.80 cm 1.55 cm/m  AORTIC VALVE LVOT Vmax:   72.50 cm/s LVOT Vmean:  59.300 cm/s LVOT VTI:    0.141 m  AORTA Ao Root diam: 2.80 cm MITRAL VALVE MV Area (PHT): 3.72 cm    SHUNTS MV Decel Time: 204 msec    Systemic VTI:  0.14 m MV E velocity: 67.70 cm/s  Systemic Diam: 2.20 cm MV A velocity: 63.80 cm/s MV E/A ratio:  1.06 Rudean Haskell MD Electronically signed by Rudean Haskell MD Signature Date/Time: 02/06/2020/6:04:10 PM    Final    [place] EKG SITE RITE  Result Date: 02/06/2020 If Site Rite image not attached, placement could not be confirmed due to current cardiac  rhythm.     Medications:     Scheduled Medications: . chlorhexidine gluconate (MEDLINE KIT)  15 mL Mouth Rinse BID  . Chlorhexidine Gluconate Cloth  6 each Topical Daily  . docusate  100 mg Per Tube BID  . feeding supplement (PROSource TF)  45 mL Per Tube QID  . insulin aspart  0-6 Units Subcutaneous Q4H  . mouth rinse  15 mL Mouth Rinse 10 times per day  . pantoprazole (PROTONIX) IV  40 mg Intravenous QHS  . polyethylene glycol  17 g Per Tube Daily  . sodium chloride flush  10-40 mL Intracatheter Q12H  . sodium chloride flush  3 mL Intravenous Q12H     Infusions: . sodium chloride    . sodium chloride 10 mL/hr at 02/07/20 0600  . calcium gluconate    . feeding supplement (VITAL AF 1.2 CAL) 40 mL/hr at 02/07/20 0200  . heparin 850 Units/hr (02/07/20 0600)  . lidocaine 2 mg/min (02/07/20 0600)  . norepinephrine (LEVOPHED) Adult infusion 3 mcg/min (02/06/20 1700)  . propofol (DIPRIVAN) infusion 50 mcg/kg/min (02/07/20 0600)     PRN Medications:  sodium chloride, acetaminophen, fentaNYL (SUBLIMAZE) injection, influenza vac split quadrivalent PF, midazolam, midazolam, midazolam, midazolam, polyethylene glycol, sodium chloride flush, sodium chloride flush    Patient Profile  Kristina Nichols is a 54 year old with h/o witnessed cardiac arrest at homewith refractory vib/ torsades. Intubated  in ER. Taken for [facility] with clean coronaries, findings consistent with stress induced cardiomyopathy, LVEF <25%.   Cardiogenic shock/torsades. Multiple shocks.   Assessment/Plan  1. V Fib /V tachArrest- refractory torsades -Cath last night with normal coronaries. Temp wire removed 10/25.  -Multiple shocks. Last shock 02/06/20.  -EP consulted. Attempted to RV pace but went back in torsades.  - Was on amio drip initially but stopped due to prolonged Qtc.  - Continue lidocaine drip 2 mg per hour. Lidocaine level 3.8 today.  - Discussed with EP at the bedside. Should she have increased ectopy  with extubation could consider salvage ablation.  - Later would consider ICD prior to d/c once she has had significant improvement.  - K and Mag ok.   2. Cardiogenic Shock /Acute Systolic HF -Cath no coronary disease, LV gram concerning for Takotsbo, EF 20%. - ECHO 10/25 RV ok, LV 20%.   - Elevated LFTs in setting of shock. - Continue IABP 1:1.Hemodynmaics ok today. CO-OX 76%. Volume status stable.  -Currently on norepi 3 mcg.  - Add 12.5 mg spiro daily.    3. Acute Respiratory Failure -Vent per CCM   4. Depression On wellbutrin/klonipin prior to admit.   5. Elevated LFTs Suspect in the setting of shock - Check LFTs.   Length of Stay: 2  Darrick Grinder, NP  02/07/2020, 7:02 AM  Advanced Heart Failure Team Pager [phone] (M-F; 7a - 4p)  Please contact [facility] for night-coverage after hours (4p -7a ) and weekends on [email or web address]  Patient seen with NP, agree with the above note.   She is sedated on vent but will awaken, follow commands.  Increased PVCs when wakes up but no further VT.  Remains on lidocaine gtt, level was 3.8 this morning.   CI 2.3 on Swan, co-ox 76%.  IABP functioning properly, now off norepinephrine.  CVP 6, PCWP 14, no diuretics.  Echo with EF < 20%, no LV thrombus, normal RV.   General: Sedated on vent.  Neck: No JVD, no thyromegaly or thyroid nodule.  Lungs: Clear to auscultation bilaterally with normal respiratory effort. CV: Nondisplaced PMI.  Heart regular, IABP sounds.  No peripheral edema.   Abdomen: Soft, nontender, no hepatosplenomegaly, no distention.  Skin: Intact without lesions or rashes.  Neurologic: Will awaken, follow commands.  Extremities: No clubbing or cyanosis.  HEENT: Normal.   1. VT: Recurrent torsades with multiple shocks, also triggered by RV pacing from temporary wire (removed). Coronaries normal.  Amiodarone stopped due to prolonged QT interval, now on lidocaine 2 with acceptable level this morning.  No further VT.  I  suspect that prolonged QT is the sequelae of stress (Takotsubo-type) cardiomyopathy.  - Continue lidocaine gtt and rest on vent today.  - Manage electrolytes.  - Continue IABP today, begin wean tomorrow.  - Add Coreg 3.125 mg bid.  2. Acute systolic CHF/cardiogenic shock: EF < 20% on echo (no LV thrombus), initial cath with normal coronaries.  Concern for stress (Takotsubo-type) cardiomyopathy.  Recent emotional stress.  Now with normal filling pressures and good cardiac output on IABP.  She is off norepinephrine.  - Continue IABP today, hopefully start to wean tomorrow.  - Add digoxin 0.125.  - Add Coreg 3.125 mg bid.  - Add spironolactone 12.5 daily.   - Will need eventual cardiac MRI.  3. Neuro: Was following commands this morning.  Was not cooled.  4. Acute hypoxemic respiratory failure: On vent.  CXR with bilateral patchy infiltrates, ?aspiration in setting of  cardiac arrest.  - CCM following.  5. Elevated LFTs: Suspect shock liver, trending down.   6. ID: Low grade fever with normal WBCs, CXR as above.  - Send procalcitonin and blood cultures.   CRITICAL CARE Performed by: Loralie Champagne  Total critical care time: 40 minutes  Critical care time was exclusive of separately billable procedures and treating other patients.  Critical care was necessary to treat or prevent imminent or life-threatening deterioration.  Critical care was time spent personally by me on the following activities: development of treatment plan with patient and/or surrogate as well as nursing, discussions with consultants, evaluation of patient's response to treatment, examination of patient, obtaining history from patient or surrogate, ordering and performing treatments and interventions, ordering and review of laboratory studies, ordering and review of radiographic studies, pulse oximetry and re-evaluation of patient's condition.  Loralie Champagne 02/07/2020

## 2020-02-07 NOTE — Progress Notes (Signed)
Kristina Nichols's IABP console alarmed a few times due to a gas leak alarm. Once assessed, no gas leak was found. [facility] was contacted again as they left their phone number if the issue happened to occur again from day shift. The IABP console was replaced and Judie Grieve stated he would have [employer] do a diagnostic on malfunctioning console as he also found no gas leaks upon assessment.

## 2020-02-07 NOTE — Progress Notes (Signed)
Progress Note  Patient Name: Kristina Nichols Date of Encounter: 02/07/2020  [facility] Cardiologist: new to [facility]  Subjective   Intubated and sedated  Inpatient Medications    Scheduled Meds: . chlorhexidine gluconate (MEDLINE KIT)  15 mL Mouth Rinse BID  . Chlorhexidine Gluconate Cloth  6 each Topical Daily  . docusate  100 mg Per Tube BID  . feeding supplement (PROSource TF)  45 mL Per Tube QID  . insulin aspart  0-6 Units Subcutaneous Q4H  . mouth rinse  15 mL Mouth Rinse 10 times per day  . pantoprazole (PROTONIX) IV  40 mg Intravenous QHS  . polyethylene glycol  17 g Per Tube Daily  . sodium chloride flush  10-40 mL Intracatheter Q12H  . sodium chloride flush  3 mL Intravenous Q12H   Continuous Infusions: . sodium chloride    . sodium chloride 10 mL/hr at 02/07/20 0600  . calcium gluconate    . feeding supplement (VITAL AF 1.2 CAL) 40 mL/hr at 02/07/20 0200  . heparin 850 Units/hr (02/07/20 0600)  . lidocaine 2 mg/min (02/07/20 0600)  . norepinephrine (LEVOPHED) Adult infusion 3 mcg/min (02/06/20 1700)  . propofol (DIPRIVAN) infusion 50 mcg/kg/min (02/07/20 0600)   PRN Meds: sodium chloride, acetaminophen, fentaNYL (SUBLIMAZE) injection, influenza vac split quadrivalent PF, midazolam, midazolam, midazolam, midazolam, polyethylene glycol, sodium chloride flush, sodium chloride flush   Vital Signs    Vitals:   02/07/20 0400 02/07/20 0442 02/07/20 0500 02/07/20 0600  BP: (!) 164/101  (!) 150/91 126/90  Pulse:  76 73 70  Resp: (!) 22 (!) 22 (!) 22 (!) 22  Temp: (!) 100.8 F (38.2 C)  (!) 100.6 F (38.1 C) 99.9 F (37.7 C)  TempSrc:      SpO2:  100% 100% 100%  Weight:   74.3 kg   Height:        Intake/Output Summary (Last 24 hours) at 02/07/2020 0658 Last data filed at 02/07/2020 0600 Gross per 24 hour  Intake 2374.36 ml  Output 2360 ml  Net 14.36 ml   Last 3 Weights 02/07/2020 02/06/2020 02/05/2020  Weight (lbs) 163 lb 12.8 oz 161 lb 13.1 oz 154 lb  5.2 oz  Weight (kg) 74.3 kg 73.4 kg 70 kg      Telemetry    SR 70's generally, last VT (TdP) was nonsustained episode about 1330 yesterday - Personally Reviewed  ECG    No new EKGs - Personally Reviewed  Physical Exam   GEN: intubated and sedated Neck: JVP 6-7 Cardiac: RRR, no murmurs, rubs, or gallops.  Respiratory: clear/intubated GI: Soft, nontender, non-distended  MS: No edema; No deformity. Neuro:  unable to assess Psych: unable to assess  Labs    High Sensitivity Troponin:   Recent Labs  Lab 02/05/20 1745 02/05/20 1929 02/05/20 2107 02/05/20 2225  TROPONINIHS 33* 88* 119* 232*      Chemistry Recent Labs  Lab 02/05/20 2225 02/06/20 0214 02/06/20 1241 02/06/20 1543 02/07/20 0048  NA 137   < > 137 135 136  K 4.9   < > 4.1 3.8 3.7  CL 109   < > 106 104 107  CO2 22   < > 24 23 22   GLUCOSE 199*   < > 120* 120* 124*  BUN 16   < > 14 13 16   CREATININE 0.94   < > 0.76 0.73 0.73  CALCIUM 7.0*   < > 8.2* 8.0* 8.0*  PROT 5.2*  --   --   --   --  ALBUMIN 3.1*  --   --   --   --   AST 898*  --   --   --   --   ALT 482*  --   --   --   --   ALKPHOS 75  --   --   --   --   BILITOT 0.8  --   --   --   --   GFRNONAA >60   < > >60 >60 >60  ANIONGAP 6   < > 7 8 7    < > = values in this interval not displayed.     Hematology Recent Labs  Lab 02/05/20 2225 02/05/20 2225 02/06/20 0214 02/06/20 1009 02/07/20 0048  WBC 8.7  --  8.9  --  9.7  RBC 3.93  --  4.22  --  3.87  HGB 12.5   < > 13.2 13.3  13.3 12.1  HCT 39.7   < > 40.6 39.0  39.0 37.2  MCV 101.0*  --  96.2  --  96.1  MCH 31.8  --  31.3  --  31.3  MCHC 31.5  --  32.5  --  32.5  RDW 13.6  --  13.7  --  13.8  PLT 203  --  215  --  143*   < > = values in this interval not displayed.    BNPNo results for input(s): BNP, PROBNP in the last 168 hours.   DDimer  Recent Labs  Lab 02/05/20 1937  DDIMER >20.00*     Radiology    CT Head Wo Contrast  Result Date: 02/05/2020 CLINICAL DATA:   54 year old female with altered mental status post cardiac arrest. EXAM: CT HEAD WITHOUT CONTRAST TECHNIQUE: Contiguous axial images were obtained from the base of the skull through the vertex without intravenous contrast. COMPARISON:  None. FINDINGS: Brain: No evidence of acute infarction, hemorrhage, hydrocephalus, extra-axial collection or mass lesion/mass effect. Vascular: No hyperdense vessel or unexpected calcification. Skull: Normal. Negative for fracture or focal lesion. Sinuses/Orbits: No acute finding. Other: Oral intubation noted. IMPRESSION: No evidence of acute intracranial abnormality. Electronically Signed   By: Margarette Canada M.D.   On: 02/05/2020 18:48   DG Chest Port 1 View Result Date: 02/05/2020 CLINICAL DATA:  Status post CPR. EXAM: PORTABLE CHEST 1 VIEW COMPARISON:  February 05, 2020 (6:03 p.m.) FINDINGS: There is stable endotracheal tube and nasogastric tube positioning. Mild diffusely increased interstitial lung markings are seen with moderate to marked severity patchy left suprahilar and right upper lobe infiltrates. Mild bilateral lower lobe infiltrates are also seen. These areas are mildly increased in severity when compared to the prior study. The heart size and mediastinal contours are within normal limits. The visualized skeletal structures are unremarkable. IMPRESSION: Moderate to marked severity bilateral patchy infiltrates, as described above, with mild interval increase in severity when compared to the prior exam. Electronically Signed   By: Virgina Norfolk M.D.   On: 02/05/2020 20:25   DG Chest Portable 1 View  Result Date: 02/05/2020 CLINICAL DATA:  Post intubation, witnessed cardiac arrest and CPR EXAM: PORTABLE CHEST 1 VIEW COMPARISON:  None FINDINGS: Endotracheal tube terminates between the clavicles approximately 4 cm above the carina. Gastric tube courses through in a off the field of the radiograph, side port in the region of the mid stomach, tip off the field of  view. Pacer defibrillator pads project over the patient. Upper lobe opacities are present with some elevation of the minor fissure in  the RIGHT chest. Less opacity at the LEFT lung apex. On limited assessment no acute skeletal process. IMPRESSION: 1. Endotracheal tube terminates between the clavicles approximately 4 cm above the carina. 2. Suggested volume loss and consolidative changes in the RIGHT upper lobe greater than LEFT upper lobe. Findings could be related to volume loss with pneumonitis from aspiration and potential background of asymmetric pulmonary edema or pneumonia. Underlying mass would be difficult to exclude. Follow-up after resolution of acute symptoms is suggested to exclude underlying lesion. 3. Gastric tube courses through a off the field of the radiograph, side port in the region of the mid stomach, tip off the field of view. Electronically Signed   By: Zetta Bills M.D.   On: 02/05/2020 18:20    Cardiac Studies    02/06/2020: TTE IMPRESSIONS  1. No LV thrombus visualized, thought cannot excluded given LV substrate.  Left ventricular ejection fraction, by estimation, is <20%. The left  ventricle has severely decreased function. The left ventricle demonstrates  global hypokinesis. Left  ventricular diastolic parameters are consistent with Grade I diastolic  dysfunction (impaired relaxation).  2. Right ventricular systolic function is normal. The right ventricular  size is normal.  3. The mitral valve is grossly normal. No evidence of mitral valve  regurgitation.  4. The aortic valve was not well visualized. Aortic valve regurgitation  is trivial.  5. Aortic IABP in Descending Aorta.  6. The inferior vena cava is dilated in size with <50% respiratory  variability, suggesting right atrial pressure of 15 mmHg.   Comparison(s): No prior Echocardiogram.    02/06/2020: RHC, IABP insertion 1. Mildly elevated left and right heart filling pressures.  2. Low cardiac  output by thermodilution on norepinephrine 4, preserved output by Fick.  3. Successful IABP placement.  4. Swan left in place right IJ.     02/06/2020: LHC  There is severe left ventricular systolic dysfunction.  LV end diastolic pressure is moderately elevated.  The left ventricular ejection fraction is less than 25% by visual estimate.  There is no mitral valve regurgitation.  1. No angiographic evidence of CAD 2. Severe segmental LV systolic dysfunction with wall motion suggesting Takotsubo's cardiomyopathy (stress induced cardiomyopathy).  3. Elevated LVEDP 4. Ventricular fibrillation/Torsades-Temporary Pacemaker placed per request of admitting/consulting team in case of need of overdrive pacing tonight  Recommendations: No further ischemic workup. Management of VF/Torsades per EP team in the am. Medical management of likely stress induced cardiomyopathy.     Patient Profile     54 y.o. female with a hx of depression/anxiety, hypothyroidism (off tx), GERD, chronic granulomatosis carrier (?), no noted past cardiac history admitted with VF arrest  She has had recurrent TdP requiring multiple defibrillations  Assessment & Plan    1. Torsades 2. Cardiogenic shock 3. Respiratory failure      Her last defibrillation was yesterday AM 0830 (while we were at bedside)      On nonsustained episode approx 1330 yesterday      intermittently she has increaesed frequency of PVCs      Good urine OP  No CAD by LHC LVEF <20%, suspect Takotsbo Reduced CO by RHC S/p IABP @ 1:1 Temp pacing wire is OUT Remains on lidocaine gtt @ 2 heparin gtt Levo @ 3 Coox this AM 75.9 Labs look OK, K+ 3.7 and given replacement  On telemetry she has waxing/waning QT prolongation Not at baseline  Dr. Quentin Ore has been at bedside this AM No changes today, follow lidocaine levels  Should she have meaningful recovery will need to think about ICD down the road  Monitor closely when weaning from  IABP and vent planned    For questions or updates, please contact [place] Please consult [email or web address] for contact info under        Signed, Baldwin [place], PA-C  02/07/2020, 6:58 AM

## 2020-02-07 NOTE — Progress Notes (Signed)
Lower extremity venous has been completed.   Preliminary results in CV Proc.   Blanch Media 02/07/2020 1:17 PM

## 2020-02-07 NOTE — Progress Notes (Signed)
ANTICOAGULATION CONSULT NOTE  Pharmacy Consult for heparin  Indication: IABP  No Known Allergies  Patient Measurements: Height: 5\' 5"  (165.1 cm) Weight: 74.3 kg (163 lb 12.8 oz) IBW/kg (Calculated) : 57 Heparin Dosing Weight: 70kg  Vital Signs: Temp: 99.5 F (37.5 C) (10/26 1245) Temp Source: Core (Comment) (10/26 0800) BP: 128/75 (10/26 1245) Pulse Rate: 35 (10/26 1100)  Labs: Recent Labs    02/05/20 1745 02/05/20 1745 02/05/20 1807 02/05/20 1929 02/05/20 2107 02/05/20 2225 02/05/20 2225 02/06/20 0214 02/06/20 0214 02/06/20 1009 02/06/20 1241 02/06/20 1543 02/06/20 2104 02/07/20 0048 02/07/20 0827  HGB 13.3   < >   < >  --   --  12.5   < > 13.2   < > 13.3  13.3  --   --   --  12.1  --   HCT 42.7   < >   < >  --   --  39.7   < > 40.6  --  39.0  39.0  --   --   --  37.2  --   PLT 180   < >  --   --   --  203  --  215  --   --   --   --   --  143*  --   LABPROT 12.9  --   --   --   --   --   --   --   --   --   --   --   --   --   --   INR 1.0  --   --   --   --   --   --   --   --   --   --   --   --   --   --   HEPARINUNFRC  --   --   --   --   --   --   --   --   --   --   --   --  0.48  --  0.26*  CREATININE 1.18*   < >   < >  --  0.83 0.94   < > 0.90   < >  --  0.76 0.73  --  0.73  --   TROPONINIHS 33*  --    < > 88* 119* 232*  --   --   --   --   --   --   --   --   --    < > = values in this interval not displayed.    Estimated Creatinine Clearance: 83.9 mL/min (by C-G formula based on SCr of 0.73 mg/dL).   Medical History: Past Medical History:  Diagnosis Date  . Anxiety   . Depression   . Hyperlipidemia   . Hypothyroidism   . Insomnia     Assessment: 54 year old female presenting to [facility] with vfib cardiac arrest. Patient has continued to have vfib this morning. Patient taken urgently to cath lab and IABP was placed. Orders to start IV heparin. CBC within normal limits  Heparin level this morning continues to be within goal range.  No bleeding  or complications noted. CBC stable.   Goal of Therapy:  Heparin level 0.2-0.5 units/ml Monitor platelets by anticoagulation protocol: Yes   Plan:  Continue IV heparin at current rate. Daily heparin level and CBC.  [facility] PharmD., BCPS Clinical Pharmacist 02/07/2020 1:56 PM

## 2020-02-07 NOTE — Progress Notes (Signed)
K 3.7 Electrolytes replaced per protocol 

## 2020-02-07 NOTE — Progress Notes (Signed)
Orthopedic Tech Progress Note Patient Details:  Kristina Nichols Apr 10, 1966 [id number] Was called to come an apply a KNEE IMMOBILIZER for patient. RN helped with applying it Ortho Devices Type of Ortho Device: Knee Immobilizer Ortho Device/Splint Location: RLE Ortho Device/Splint Interventions: Application, Adjustment   Post Interventions Patient Tolerated: Well Instructions Provided: Care of device   Donald Pore 02/07/2020, 7:35 AM

## 2020-02-07 NOTE — Progress Notes (Addendum)
NAME:  Kristina Nichols, MRN:  [id number], DOB:  1966-04-13, LOS: 2 ADMISSION DATE:  02/05/2020, CONSULTATION DATE:  02/05/20 REFERRING MD: Freida Busman (ED), CHIEF COMPLAINT:  Cardiac arrest  Brief History   54 yo woman here after witnessed cardiac arrest at home with refractory vib/ torsades.  Intubated in ER.  Taken for [facility] with clean coronaries, findings consistent with stress induced cardiomyopathy, LVEF < 25%.     History of present illness   Witnessed loss of consciousness, possibly seizure activity by her husband.   7 min of CPR reported, including Chest compressions done by her husband who is reportedly an EMT.  EMS cardioverted her and transported her, reportedly gave her sedating medication for agitation and reaching for the NP airway in route.  She was immediately intubated in the ED for airway protection.  Initial work up was done, relatively stable initially.  Started on propofol  Developed refractory vfib/torsades in the ED.   Multiple cardioversions.   Mag 2gm given x 2 Lidocaine 100mg  given x1. Started on amiodarone gtt.  Per nurse received a total of 2L NS.  Tox screen pos for amphetamines, benzodiazepenes.  Mild hypotension, MAP mid 60s, following arrest. Cardiology has evaluated, patient being taken to cath lab for coronary angiography.    Past Medical History  HLD Depression/Anxiety - Klonopin, lamictal GERD Hypothyroidism - stopped med 08/2017 per last clinic note Insomnia - trazodone Hearing loss Endometriosis Gastroparesis Lymphedema Sp cholecystectomy, hyterectomy DOE 08/2017  Meds: wellbutrin, klonopin, neuronitn, advil, lamictal 100BID, omeprazole, levothyroxine, vyvanse 60 daily  Significant Hospital Events   10/24 Admitted   Consults:  Cardiology   Procedures:  10/24 ETT >> 10/24 R femoral sheath >>10/25, temp pacer removed; IABP inserted >> 10/25 R IJ cordis with PA catheter >>  Significant Diagnostic Tests:  10/24 [facility] >> neg  Cardiac cath 10/24  >>  There is severe left ventricular systolic dysfunction.  LV end diastolic pressure is moderately elevated.  The left ventricular ejection fraction is less than 25% by visual estimate.  There is no mitral valve regurgitation. 1. No angiographic evidence of CAD 2. Severe segmental LV systolic dysfunction with wall motion suggesting Takotsubo's cardiomyopathy (stress induced cardiomyopathy).  3. Elevated LVEDP 4. Ventricular fibrillation/Torsades-Temporary Pacemaker placed per request of admitting/consulting team in case of need of overdrive pacing tonight  10/25 RHC >> 1. Mildly elevated left and right heart filling pressures.  2. Low cardiac output by thermodilution on norepinephrine 4, preserved output by Fick.  3. Successful IABP placement.  4. Swan left in place right IJ.  RA mean 12 RV 31/13 PA 33/19, mean 24 PCWP mean 17 Oxygen saturations: PA 73% AO 100% Cardiac Output (Thermo) 3.53 Cardiac Index (Thermo) 1.95 PVR 2 WU Cardiac Output (Fick) 4.95 Cardiac Index (Fick) 2.74  10/25 TTE >> 1. No LV thrombus visualized, thought cannot excluded given LV substrate.  Left ventricular ejection fraction, by estimation, is <20%. The left ventricle has severely decreased function. The left ventricle demonstrates global hypokinesis. Left  ventricular diastolic parameters are consistent with Grade I diastolic dysfunction (impaired relaxation).  2. Right ventricular systolic function is normal. The right ventricular size is normal.  3. The mitral valve is grossly normal. No evidence of mitral valve regurgitation.  4. The aortic valve was not well visualized. Aortic valve regurgitation is trivial.  5. Aortic IABP in Descending Aorta.  6. The inferior vena cava is dilated in size with <50% respiratory variability, suggesting right atrial pressure of 15 mmHg.  Micro Data:  10/24 SARS2 >> neg 10/24 MRSA PCR >> neg 10/26 BCx 2 >>  Antimicrobials:  n/a  Interim  history/subjective:  IABP 1: 1 CO-OX 76 CVP 13 CO 7.1/ CI 3.9 Off NE Remains on Lido gtt 2 mg/min tmax 100.8 -69 ml/ 24hr/ net -3.5L Continues to intermittently follow commands No further torsades, occasional ectopy -more so when her sedation is light  Objective   Blood pressure 122/78, pulse 75, temperature 99.3 F (37.4 C), resp. rate (!) 23, height 5\' 5"  (1.651 m), weight 74.3 kg, SpO2 100 %. PAP: (14-26)/(7-20) 23/15 CVP:  [2 mmHg-79 mmHg] 6 mmHg PCWP:  [14 mmHg] 14 mmHg CO:  [3.1 L/min-4.1 L/min] 4.1 L/min CI:  [1.7 L/min/m2-2.3 L/min/m2] 2.3 L/min/m2  Vent Mode: PRVC FiO2 (%):  [40 %-60 %] 40 % Set Rate:  [22 bmp] 22 bmp Vt Set:  [450 mL] 450 mL PEEP:  [5 cmH20] 5 cmH20 Plateau Pressure:  [17 cmH20-19 cmH20] 18 cmH20   Intake/Output Summary (Last 24 hours) at 02/07/2020 02/09/2020 Last data filed at 02/07/2020 0600 Gross per 24 hour  Intake 2221.49 ml  Output 2210 ml  Net 11.49 ml   Filed Weights   02/05/20 1800 02/06/20 0000 02/07/20 0500  Weight: 70 kg 73.4 kg 74.3 kg   Examination: General:  Critically ill adult female sedated/ intubated on MV HEENT: MM pink/moist, ETT/ OGT, pupils 3/reactive Neuro: briefly opens eyes/ nods, attempts to wiggle toes CV: RR, mechanical pump sounds, right groin site soft PULM:  MV supported breaths, coarse throughout GI: soft, bs+, foley (UOP ~1.3 ml/kg/hr) Extremities: cool/dry, trace LE edema  Skin: erythema right neck/chest area- ?reaction from dressing since removed  Stable renal function  K 3.7 s/p replete Mag 2.2 Platelets 215-> 143  Resolved Hospital Problem list     Assessment & Plan:   Cardiogenic shock  Acute systolic heart failure  Stress induced cardiomyopathy  Recurrent Vfib/ Torsades with brief cardiac arrest  - LHC 10/25 with clean coronaries - patient has been under major recent stress due to critical illness of her son - TTE 10/25 EF 20%, RV systolic function normal  P:  Per HF team and EP  Goal MAP  > 65, NE prn  Continue IABP per HF Trending PA hemodynamics per HF team  Lido gtt per HF  Coreg and spironolactone added  Goal Mag > 2.5, K > 4.5  Heparin gtt per pharmacy  Strict I/Os Trending CMET/ Mag   Acute encephalopathy after cardiac arrest - follow commands 10/25 / 10/26 P:  Ongoing neuro checks Avoid fevers  PAD protocol with propofol and prn fentanyl for RASS goal -3/-4   Acute Hypoxic respiratory failure related to above Pulmonary edema  P:  Full MV support, PRVC  Continue to wean FiO2 for sat goal > 94% Intermittent CXR/ ABG VAP bundle Hold on SBT today, continue sedation to allow additional day of rest/ cardiac recovery   Hx anxiety/ depression - no concern from husband over accidental/ intentional OD P:  Holding home Wellbutrin and Lamictal  Pending lamictal level  Elevated Ddimer > 20, tmax 100.8 P:  TTE 10/25- no LV thrombus visualized but can not exclude given IABP LE doppler duplex ordered  Checking PCT and sending BCx 2  Hold on abx for now CXR ordered -> near resolution of bilateral infiltrates most c/w pulmonary edema  Hypocalcemia P:  S/p replete 10/25  Best practice:  Diet: NPO; start TF Pain/Anxiety/Delirium protocol (if indicated): Propofol/ prn fentanyl  VAP protocol (if indicated): yes  DVT prophylaxis: lovenox GI prophylaxis: protonix -> pepcid per tube  Glucose control: CBG q4, add SSI if > 180 Mobility: bed Code Status: Full Family Communication: husband at bedside updated  Disposition: ICU  Labs   CBC: Recent Labs  Lab 02/05/20 1745 02/05/20 1807 02/05/20 1853 02/05/20 2225 02/06/20 0214 02/06/20 1009 02/07/20 0048  WBC 8.1  --   --  8.7 8.9  --  9.7  HGB 13.3   < > 11.9* 12.5 13.2 13.3  13.3 12.1  HCT 42.7   < > 35.0* 39.7 40.6 39.0  39.0 37.2  MCV 99.3  --   --  101.0* 96.2  --  96.1  PLT 180  --   --  203 215  --  143*   < > = values in this interval not displayed.    Basic Metabolic Panel: Recent Labs   Lab 02/05/20 1807 02/05/20 1929 02/05/20 2107 02/05/20 2107 02/05/20 2225 02/05/20 2225 02/06/20 0214 02/06/20 1009 02/06/20 1241 02/06/20 1543 02/07/20 0048  NA   < >  --  137   < > 137   < > 139 138  138 137 135 136  K   < >  --  3.9   < > 4.9   < > 3.9 4.3  4.4 4.1 3.8 3.7  CL   < >  --  110   < > 109  --  108  --  106 104 107  CO2   < >  --  19*   < > 22  --  25  --  24 23 22   GLUCOSE   < >  --  208*   < > 199*  --  144*  --  120* 120* 124*  BUN   < >  --  16   < > 16  --  14  --  14 13 16   CREATININE   < >  --  0.83   < > 0.94  --  0.90  --  0.76 0.73 0.73  CALCIUM   < >  --  6.3*   < > 7.0*  --  7.8*  --  8.2* 8.0* 8.0*  MG  --  4.4* 3.3*  --   --   --  2.6*  --   --  2.3 2.2  PHOS  --   --   --   --   --   --  2.3*  --   --   --   --    < > = values in this interval not displayed.   GFR: Estimated Creatinine Clearance: 83.9 mL/min (by C-G formula based on SCr of 0.73 mg/dL). Recent Labs  Lab 02/05/20 1745 02/05/20 1937 02/05/20 2225 02/06/20 0110 02/06/20 0214 02/07/20 0048  WBC 8.1  --  8.7  --  8.9 9.7  LATICACIDVEN 3.8* 2.4* 1.3 1.9  --   --     Liver Function Tests: Recent Labs  Lab 02/05/20 2225  AST 898*  ALT 482*  ALKPHOS 75  BILITOT 0.8  PROT 5.2*  ALBUMIN 3.1*   No results for input(s): LIPASE, AMYLASE in the last 168 hours. No results for input(s): AMMONIA in the last 168 hours.  ABG    Component Value Date/Time   PHART 7.298 (L) 02/05/2020 1853   PCO2ART 52.4 (H) 02/05/2020 1853   PO2ART 255 (H) 02/05/2020 1853   HCO3 23.9 02/06/2020 1009   HCO3 23.7 02/06/2020 1009   TCO2 25  02/06/2020 1009   TCO2 25 02/06/2020 1009   ACIDBASEDEF 1.0 02/05/2020 1853   O2SAT 75.9 02/07/2020 0048     Coagulation Profile: Recent Labs  Lab 02/05/20 1745  INR 1.0    Cardiac Enzymes: No results for input(s): CKTOTAL, CKMB, CKMBINDEX, TROPONINI in the last 168 hours.  HbA1C: Hgb A1c MFr Bld  Date/Time Value Ref Range Status  02/06/2020  12:41 PM 5.3 4.8 - 5.6 % Final    Comment:    (NOTE)         Prediabetes: 5.7 - 6.4         Diabetes: >6.4         Glycemic control for adults with diabetes: <7.0     CBG: Recent Labs  Lab 02/06/20 1955 02/06/20 2355 02/07/20 0447 02/07/20 0450 02/07/20 0453  GLUCAP 103* 100* 67* 70 107*    Critical care time: 35 minutes     Posey Boyer, ACNP [facility] 02/07/2020, 8:08 AM  See Loretha Stapler for personal pager PCCM on call pager [phone]   Pulmonary critical care attending:  This is a 54 year old female witnessed cardiac arrest at home, V. fib found to have stress-induced cardiomyopathy, left heart cath with clean coronaries, reduced ejection fraction.  Patient taken to the Cath Lab yesterday with reduced cardiac output and intra-aortic balloon pump placement.  For cardiogenic shock.  Patient reviewed this morning on multidisciplinary rounds with cardiology services advanced heart failure team, bedside nursing and pharmacy.  BP 124/74 (BP Location: Left Arm)   Pulse 75   Temp 99.9 F (37.7 C) (Core (Comment))   Resp (!) 22   Ht 5\' 5"  (1.651 m)   Wt 74.3 kg   LMP  (LMP Unknown)   SpO2 100%   BMI 27.26 kg/m   General: Middle-aged female intubated mechanical life support critically ill Heart: Regular rhythm, S1-S2, mostly sinus but however does have ectopy on occasion on telemetry. Lungs: Bilateral mechanically ventilated breath sounds Abdomen: Soft nontender nondistended Extremities: Right extremity immobilized from intra-aortic balloon pump placement. Labs: Reviewed Chest x-ray: Swan-Ganz catheter appropriate placement bilateral spurs today's consider with acute pulmonary edema.  Assessment: Cardiogenic shock Acute systolic heart failure Stress-induced cardiomyopathy V. fib, VT cardiac arrest status post brief 2 minutes of CPR Acute metabolic encephalopathy after cardiac arrest Acute hypoxemic respiratory failure requiring  intubation mechanical ventilation secondary above Elevated D-dimer greater than 20, low grade fever.  Plan: Patient at this point able to use vasopressors to maintain mean artery pressure greater than 65 mmHg. Balloon pump in place. Anticoagulation on balloon pump. Continue lidocaine infusion We appreciate heart failure service and EP input. Continue Coreg plus spironolactone as we are off vasopressors. Chest x-ray as needed Wean PEEP and FiO2 to maintain sats greater than 90%. Bilateral lower extremity duplex for evaluation of elevated D-dimer, already on heparin anticoagulation at this time.  No obvious LV thrombus on cardiac imaging.  This patient is critically ill with multiple organ system failure; which, requires frequent high complexity decision making, assessment, support, evaluation, and titration of therapies. This was completed through the application of advanced monitoring technologies and extensive interpretation of multiple databases. During this encounter critical care time was devoted to patient care services described in this note for 35 minutes.  , DO [facility] 02/07/2020 9:48 AM

## 2020-02-07 NOTE — Progress Notes (Signed)
   02/07/20 1100  Clinical Encounter Type  Visited With Patient not available;Health care provider  Visit Type Follow-up  Referral From Nurse  Consult/Referral To Chaplain  The chaplain responded to a spiritual care consult. The chaplain spoke with nurses assigned to the patient Liborio Nixon and Joni Reining). The patient is unable to talk at this time and no family was present. The chaplain did inform the nurses that if additional services were required to page the chaplain. The chaplain will follow up as needed.

## 2020-02-08 ENCOUNTER — Inpatient Hospital Stay (HOSPITAL_COMMUNITY): Payer: BC Managed Care – PPO

## 2020-02-08 LAB — BASIC METABOLIC PANEL
Anion gap: 9 (ref 5–15)
BUN: 10 mg/dL (ref 6–20)
CO2: 21 mmol/L — ABNORMAL LOW (ref 22–32)
Calcium: 7.9 mg/dL — ABNORMAL LOW (ref 8.9–10.3)
Chloride: 107 mmol/L (ref 98–111)
Creatinine, Ser: 0.63 mg/dL (ref 0.44–1.00)
GFR, Estimated: >60 mL/min (ref >60)
Glucose, Bld: 128 mg/dL — ABNORMAL HIGH (ref 70–99)
Potassium: 3.9 mmol/L (ref 3.5–5.1)
Sodium: 137 mmol/L (ref 135–145)

## 2020-02-08 LAB — CBC
HCT: 32.4 % — ABNORMAL LOW (ref 36.0–46.0)
HCT: 31.2 % — ABNORMAL LOW (ref 36.0–46.0)
Hemoglobin: 10.3 g/dL — ABNORMAL LOW (ref 12.0–15.0)
Hemoglobin: 9.9 g/dL — ABNORMAL LOW (ref 12.0–15.0)
MCH: 31.1 pg (ref 26.0–34.0)
MCH: 30.8 pg (ref 26.0–34.0)
MCHC: 31.8 g/dL (ref 30.0–36.0)
MCHC: 31.7 g/dL (ref 30.0–36.0)
MCV: 97.9 fL (ref 80.0–100.0)
MCV: 97.2 fL (ref 80.0–100.0)
Platelets: 71 10*3/uL — ABNORMAL LOW (ref 150–400)
Platelets: 75 10*3/uL — ABNORMAL LOW (ref 150–400)
RBC: 3.31 MIL/uL — ABNORMAL LOW (ref 3.87–5.11)
RBC: 3.21 MIL/uL — ABNORMAL LOW (ref 3.87–5.11)
RDW: 13.9 % (ref 11.5–15.5)
RDW: 13.9 % (ref 11.5–15.5)
WBC: 7.4 10*3/uL (ref 4.0–10.5)
WBC: 8.4 10*3/uL (ref 4.0–10.5)
nRBC: 0 % (ref 0.0–0.2)
nRBC: 0 % (ref 0.0–0.2)

## 2020-02-08 LAB — URINE CULTURE: Culture: NO GROWTH

## 2020-02-08 LAB — GLUCOSE, CAPILLARY
Glucose-Capillary: 111 mg/dL — ABNORMAL HIGH (ref 70–99)
Glucose-Capillary: 117 mg/dL — ABNORMAL HIGH (ref 70–99)
Glucose-Capillary: 135 mg/dL — ABNORMAL HIGH (ref 70–99)
Glucose-Capillary: 71 mg/dL (ref 70–99)
Glucose-Capillary: 73 mg/dL (ref 70–99)
Glucose-Capillary: 77 mg/dL (ref 70–99)
Glucose-Capillary: 89 mg/dL (ref 70–99)

## 2020-02-08 LAB — COOXEMETRY PANEL
Carboxyhemoglobin: 0.8 % (ref 0.5–1.5)
Methemoglobin: 1.2 % (ref 0.0–1.5)
O2 Saturation: 82.3 %
Total hemoglobin: 10.6 g/dL — ABNORMAL LOW (ref 12.0–16.0)

## 2020-02-08 LAB — POCT ACTIVATED CLOTTING TIME
Activated Clotting Time: 136 seconds
Activated Clotting Time: 131 seconds

## 2020-02-08 LAB — LIDOCAINE LEVEL
Lidocaine Lvl: 15.9 ug/mL — ABNORMAL HIGH (ref 1.5–5.0)
Lidocaine Lvl: 4.5 ug/mL (ref 1.5–5.0)

## 2020-02-08 LAB — TRIGLYCERIDES: Triglycerides: 85 mg/dL (ref <150)

## 2020-02-08 LAB — HEPARIN LEVEL (UNFRACTIONATED)
Heparin Unfractionated: 0.16 IU/mL — ABNORMAL LOW (ref 0.30–0.70)
Heparin Unfractionated: 0.18 IU/mL — ABNORMAL LOW (ref 0.30–0.70)

## 2020-02-08 LAB — PROCALCITONIN: Procalcitonin: <0.1 ng/mL

## 2020-02-08 LAB — MAGNESIUM: Magnesium: 2 mg/dL (ref 1.7–2.4)

## 2020-02-08 IMAGING — DX DG CHEST 1V PORT
1 series · 1 of 1 positions shown · non-contrast
Comparison: [DATE].

CLINICAL DATA: Acute systolic heart failure.

EXAM:
PORTABLE CHEST 1 VIEW

[chest ap]
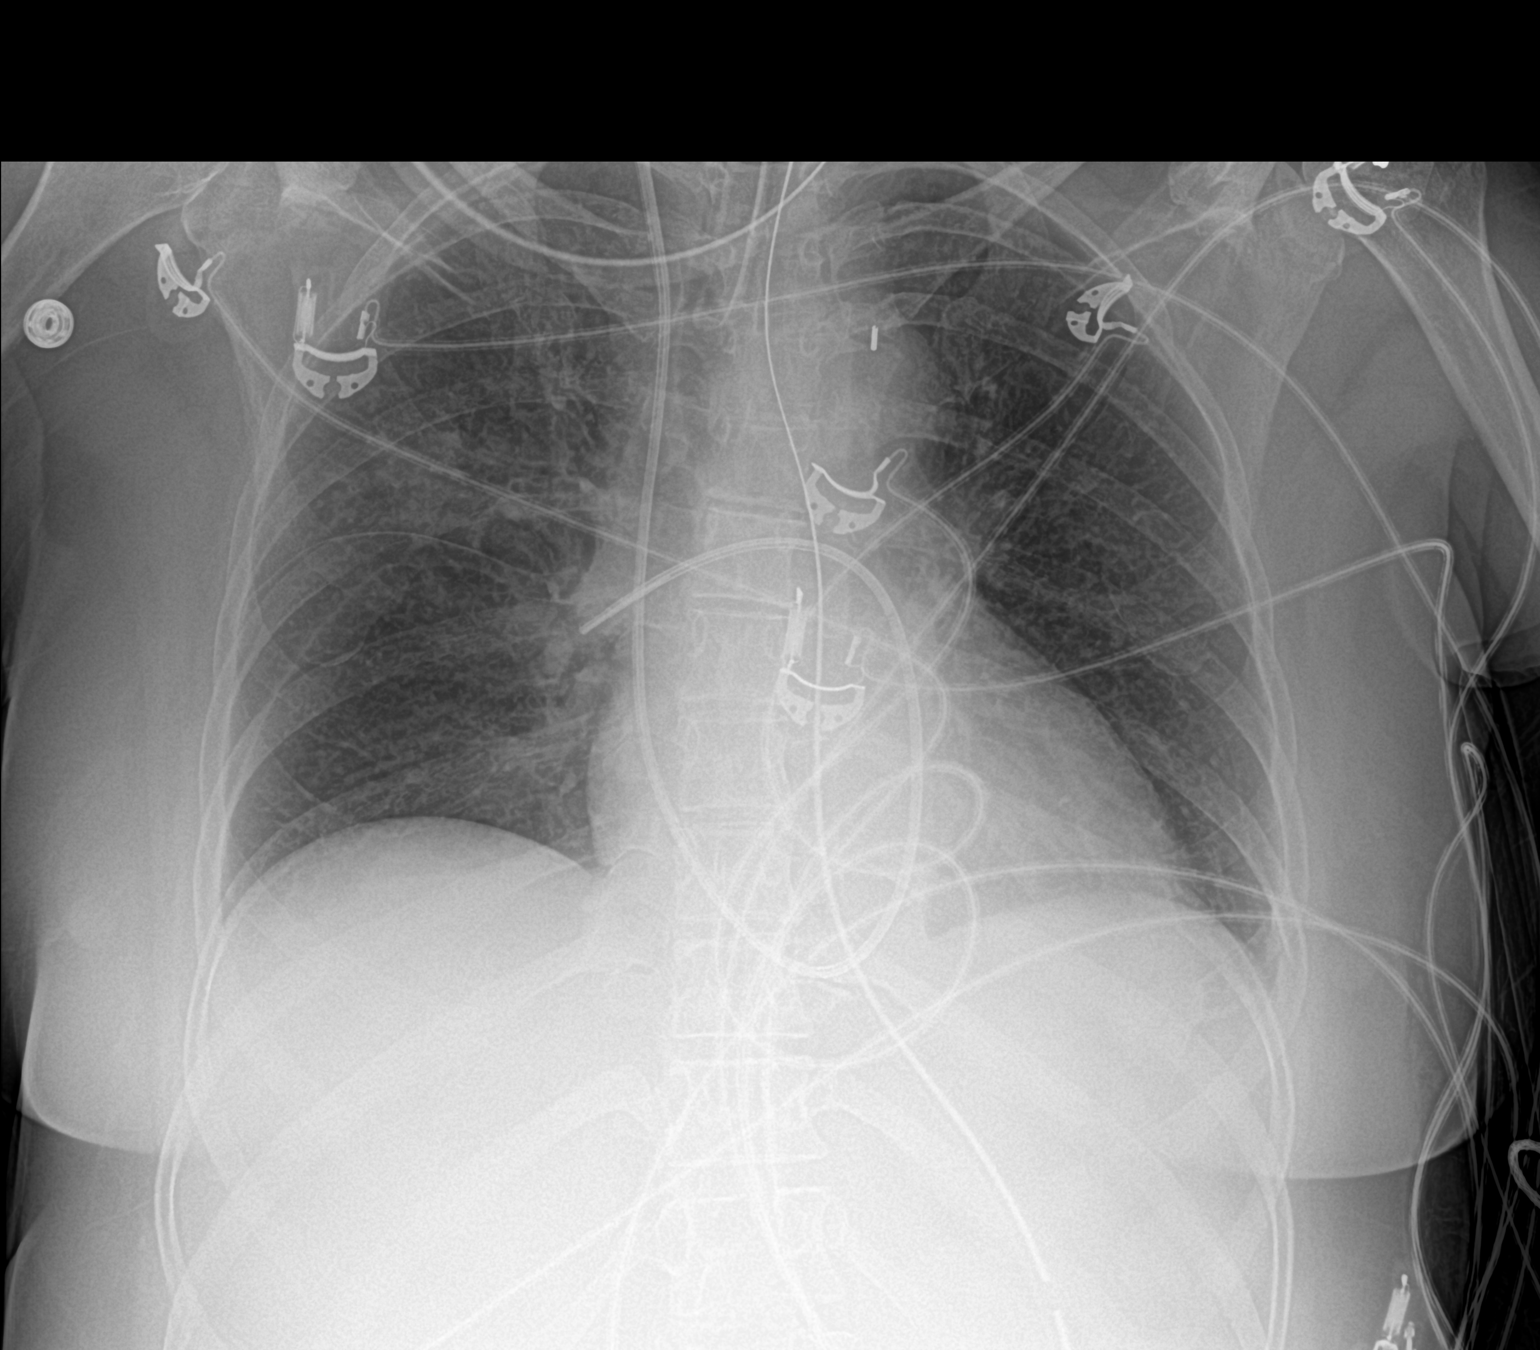

[1 of 1 positions shown; findings below may reference images not displayed]

FINDINGS: The heart size and mediastinal contours are within normal limits.
Endotracheal and nasogastric tubes are unchanged in position. Right
internal jugular Swan-Ganz catheter is unchanged. No pneumothorax or
pleural effusion is noted. Left lung is clear. Right upper lobe
airspace opacity is nearly resolved. The visualized skeletal
structures are unremarkable.
IMPRESSION: Stable support apparatus. Right upper lobe airspace opacity is
nearly resolved.

## 2020-02-08 MED ORDER — CLONAZEPAM 0.1 MG/ML ORAL SUSPENSION: 1.0000 mg | Freq: Three times a day (TID) | ORAL | Status: DC | PRN

## 2020-02-08 MED ORDER — BUPROPION HCL 100 MG PO TABS
100.0000 mg | ORAL_TABLET | Freq: Three times a day (TID) | ORAL | Status: DC
  Administered 2020-02-08 – 2020-02-09 (×4): 100 mg
  Filled 2020-02-08 (×6): qty 1

## 2020-02-08 MED ORDER — VANCOMYCIN HCL 10 G IV SOLR
1500.0000 mg | Freq: Once | INTRAVENOUS | Status: DC
Start: 2020-02-08 — End: 2020-02-08

## 2020-02-08 MED ORDER — DIGOXIN 125 MCG PO TABS
0.1250 mg | ORAL_TABLET | Freq: Every day | ORAL | Status: DC
  Administered 2020-02-09: 0.125 mg
  Filled 2020-02-08: qty 1

## 2020-02-08 MED ORDER — BUPROPION HCL 100 MG PO TABS: 300.0000 mg | ORAL_TABLET | Freq: Every morning | ORAL | Status: DC

## 2020-02-08 MED ORDER — VANCOMYCIN HCL 1.5 G IV SOLR: 1500.0000 mg | Freq: Once | INTRAVENOUS | Status: DC

## 2020-02-08 MED ORDER — LAMOTRIGINE 150 MG PO TABS
150.0000 mg | ORAL_TABLET | Freq: Two times a day (BID) | ORAL | Status: DC
  Administered 2020-02-08 – 2020-02-09 (×3): 150 mg
  Filled 2020-02-08 (×4): qty 1

## 2020-02-08 MED ORDER — SPIRONOLACTONE 25 MG PO TABS
25.0000 mg | ORAL_TABLET | Freq: Every day | ORAL | Status: DC
  Administered 2020-02-08 – 2020-02-09 (×2): 25 mg
  Filled 2020-02-08 (×2): qty 1

## 2020-02-08 MED ORDER — CLONAZEPAM 0.5 MG PO TBDP
1.0000 mg | ORAL_TABLET | Freq: Three times a day (TID) | ORAL | Status: DC | PRN
  Administered 2020-02-09: 1 mg
  Filled 2020-02-08: qty 2

## 2020-02-08 MED ORDER — VANCOMYCIN HCL 1500 MG/300ML IV SOLN
1500.0000 mg | Freq: Once | INTRAVENOUS | Status: AC
  Administered 2020-02-08: 1500 mg via INTRAVENOUS
  Filled 2020-02-08: qty 300

## 2020-02-08 MED ORDER — VANCOMYCIN HCL IN DEXTROSE 1-5 GM/200ML-% IV SOLN
1000.0000 mg | Freq: Two times a day (BID) | INTRAVENOUS | Status: DC
  Administered 2020-02-08: 1000 mg via INTRAVENOUS
  Filled 2020-02-08 (×3): qty 200

## 2020-02-08 MED ORDER — SODIUM CHLORIDE 0.9 % IV SOLN
2.0000 g | Freq: Three times a day (TID) | INTRAVENOUS | Status: DC
  Administered 2020-02-08 – 2020-02-10 (×6): 2 g via INTRAVENOUS
  Filled 2020-02-08 (×6): qty 2

## 2020-02-08 MED ORDER — CLONAZEPAM 0.5 MG PO TBDP: 1.0000 mg | ORAL_TABLET | Freq: Three times a day (TID) | ORAL | Status: DC | PRN

## 2020-02-08 MED ORDER — ENOXAPARIN SODIUM 40 MG/0.4ML ~~LOC~~ SOLN
40.0000 mg | SUBCUTANEOUS | Status: DC
  Administered 2020-02-08: 40 mg via SUBCUTANEOUS
  Filled 2020-02-08: qty 0.4

## 2020-02-08 MED ORDER — VANCOMYCIN HCL 1.5 G IV SOLR
1500.0000 mg | Freq: Once | INTRAVENOUS | Status: DC
  Filled 2020-02-08 (×3): qty 1500

## 2020-02-08 NOTE — Progress Notes (Signed)
Silver colored ring with gold colored design, identified by the husband as "her wedding band," sent home with husband.

## 2020-02-08 NOTE — Progress Notes (Signed)
° °  IABP weaned to 1:3 this morning. Stable.   PAP: (22-42)/(7-22) 22/7 CVP:  [3 mmHg-14 mmHg] 9 mmHg PCWP:  [11 mmHg-15 mmHg] 14 mmHg CO:  [4.5 L/min-6.1 L/min] 6.1 L/min CI:  [2.5 L/min/m2-3.4 L/min/m2] 3.4 L/min/m2   Stop Heparin. Cath lab will pull IABP . Dr Shirlee Latch and agrees with IABP removal.    Victoria Henshaw NP-C  12:05 PM

## 2020-02-08 NOTE — Progress Notes (Signed)
Pharmacy Antibiotic Note  Kristina Nichols is a 54 y.o. female admitted on 02/05/2020 with pneumonia.  Pharmacy has been consulted for vancomycin and cefepime dosing.  WBC 8.4, Scr 0.63 (CrCl 84 mL/min), PCT<0.1, temp 100. CXR RUL airspace opacity is nearly resolved.   Plan: Cefepime 2g IV every 8 hours Vancomycin 1500 mg IV once then 1g IV every 12 hours Monitor renal fx, cx results, clinical pic, and vanc levels as appropriate  Height: 5\' 5"  (165.1 cm) Weight: 75 kg (165 lb 5.5 oz) IBW/kg (Calculated) : 57  Temp (24hrs), Avg:100.4 F (38 C), Min:99.1 F (37.3 C), Max:101.5 F (38.6 C)  Recent Labs  Lab 02/05/20 1745 02/05/20 1807 02/05/20 1937 02/05/20 2107 02/05/20 2225 02/05/20 2225 02/06/20 0110 02/06/20 0214 02/06/20 0214 02/06/20 1241 02/06/20 1543 02/07/20 0048 02/07/20 1442 02/08/20 0306 02/08/20 0558  WBC 8.1  --   --    < > 8.7  --   --  8.9  --   --   --  9.7  --  7.4 8.4  CREATININE 1.18*   < >  --    < > 0.94   < >  --  0.90   < > 0.76 0.73 0.73 0.64 0.63  --   LATICACIDVEN 3.8*  --  2.4*  --  1.3  --  1.9  --   --   --   --   --   --   --   --    < > = values in this interval not displayed.    Estimated Creatinine Clearance: 84.3 mL/min (by C-G formula based on SCr of 0.63 mg/dL).    No Known Allergies  Antimicrobials this admission: Vancomycin 10/27 >>  Cefepime 10/27 >>   Dose adjustments this admission: N/A  Microbiology results: 10/26 BCx: ngtd 10/26 UCx: sent  10/24 MRSA PCR: neg  Thank you for allowing pharmacy to be a part of this patient's care.  11/24, PharmD, BCCCP Clinical Pharmacist  Phone: [phone] 02/08/2020 8:45 AM  Please check AMION for all [facility] Pharmacy phone numbers After 10:00 PM, call Main Pharmacy [phone]

## 2020-02-08 NOTE — Progress Notes (Addendum)
NAME:  Kristina Nichols, MRN:  [id number], DOB:  Sep 03, 1965, LOS: 3 ADMISSION DATE:  02/05/2020, CONSULTATION DATE:  02/05/20 REFERRING MD: Freida Busman (ED), CHIEF COMPLAINT:  Cardiac arrest  Brief History   54 yo woman here after witnessed cardiac arrest at home with refractory vib/ torsades.  Intubated in ER.  Taken for [facility] with clean coronaries, findings consistent with stress induced cardiomyopathy, LVEF < 25%.     History of present illness   Witnessed loss of consciousness, possibly seizure activity by her husband.   7 min of CPR reported, including Chest compressions done by her husband who is reportedly an EMT.  EMS cardioverted her and transported her, reportedly gave her sedating medication for agitation and reaching for the NP airway in route.  She was immediately intubated in the ED for airway protection.  Initial work up was done, relatively stable initially.  Started on propofol  Developed refractory vfib/torsades in the ED.   Multiple cardioversions.   Mag 2gm given x 2 Lidocaine 100mg  given x1. Started on amiodarone gtt.  Per nurse received a total of 2L NS.  Tox screen pos for amphetamines, benzodiazepenes.  Mild hypotension, MAP mid 60s, following arrest. Cardiology has evaluated, patient being taken to cath lab for coronary angiography.    Past Medical History  HLD Depression/Anxiety - Klonopin, lamictal GERD Hypothyroidism - stopped med 08/2017 per last clinic note Insomnia - trazodone Hearing loss Endometriosis Gastroparesis Lymphedema Sp cholecystectomy, hyterectomy DOE 08/2017  Meds: wellbutrin, klonopin, neuronitn, advil, lamictal, omeprazole, levothyroxine, vyvanse   Significant Hospital Events   10/24 Admitted  10/25 IABP, Heart failure and EP consulted  10/27 IABP 1:2. plt down trending. Remains febrile, starting abx  Consults:  Cardiology EP Adv HF  Procedures:  10/24 ETT >> 10/24 R femoral sheath >>10/25, temp pacer removed; IABP inserted >> 10/25  R IJ cordis with PA catheter >>  Significant Diagnostic Tests:  10/24 [facility] >> neg  Cardiac cath 10/24 >>  There is severe left ventricular systolic dysfunction.  LV end diastolic pressure is moderately elevated.  The left ventricular ejection fraction is less than 25% by visual estimate.  There is no mitral valve regurgitation. 1. No angiographic evidence of CAD 2. Severe segmental LV systolic dysfunction with wall motion suggesting Takotsubo's cardiomyopathy (stress induced cardiomyopathy).  3. Elevated LVEDP 4. Ventricular fibrillation/Torsades-Temporary Pacemaker placed per request of admitting/consulting team in case of need of overdrive pacing tonight  10/25 RHC >> 1. Mildly elevated left and right heart filling pressures.  2. Low cardiac output by thermodilution on norepinephrine 4, preserved output by Fick.  3. Successful IABP placement.  4. Swan left in place right IJ.  RA mean 12 RV 31/13 PA 33/19, mean 24 PCWP mean 17 Oxygen saturations: PA 73% AO 100% Cardiac Output (Thermo) 3.53 Cardiac Index (Thermo) 1.95 PVR 2 WU Cardiac Output (Fick) 4.95 Cardiac Index (Fick) 2.74  10/25 TTE >> 1. No LV thrombus visualized, thought cannot excluded given LV substrate.  Left ventricular ejection fraction, by estimation, is <20%. The left ventricle has severely decreased function. The left ventricle demonstrates global hypokinesis. Left  ventricular diastolic parameters are consistent with Grade I diastolic dysfunction (impaired relaxation).  2. Right ventricular systolic function is normal. The right ventricular size is normal.  3. The mitral valve is grossly normal. No evidence of mitral valve regurgitation.  4. The aortic valve was not well visualized. Aortic valve regurgitation is trivial.  5. Aortic IABP in Descending Aorta.  6. The inferior vena cava is  dilated in size with <50% respiratory variability, suggesting right atrial pressure of 15 mmHg.  Micro Data:   10/24 SARS2 >> neg 10/24 MRSA PCR >> neg 10/26 BCx 2 > no growth  Antimicrobials:  10/27> vanc 10/27> cefepime  Interim history/subjective:  Remains on low dose NE gtt On lido gtt at 2 mg/min-- lido level 15.9 -- suspect drawn off line infusing lido, will recheck   Plt 71 on heparin gtt (subtherapeutic heparin level)  Received large bolus of fentanyl at end of night shift and is quite sedated this morning  Objective   Blood pressure 106/67, pulse 64, temperature 99.9 F (37.7 C), resp. rate (!) 22, height 5\' 5"  (1.651 m), weight 75 kg, SpO2 100 %. PAP: (17-42)/(8-33) 26/11 CVP:  [3 mmHg-14 mmHg] 8 mmHg PCWP:  [11 mmHg-15 mmHg] 11 mmHg CO:  [4.5 L/min-7.1 L/min] 4.5 L/min CI:  [2.5 L/min/m2-3.9 L/min/m2] 2.5 L/min/m2  Vent Mode: PRVC FiO2 (%):  [40 %] 40 % Set Rate:  [22 bmp] 22 bmp Vt Set:  [450 mL] 450 mL PEEP:  [5 cmH20] 5 cmH20 Plateau Pressure:  [18 cmH20-20 cmH20] 19 cmH20   Intake/Output Summary (Last 24 hours) at 02/08/2020 0706 Last data filed at 02/08/2020 0630 Gross per 24 hour  Intake 2706.72 ml  Output 2640 ml  Net 66.72 ml   Filed Weights   02/06/20 0000 02/07/20 0500 02/08/20 0419  Weight: 73.4 kg 74.3 kg 75 kg   Examination: General:  Critically ill appearing middle aged F, intubated sedated NAD   HEENT: NCAT pink mmm anicteric sclera. ETT secure  Neuro: Sedated. Grimaces to pain. PERRL 27mm  CV: regular rate. IABP 1:2. Cap refill < 3 seconds.  PULM:  Coarse bilateral sounds without wheeze, rhonchi or crackles. Symmetrical chest expansion. Mechanically ventilated  GI: Soft round ndnt + bowel sounds  Extremities: No obvious joint deformity. No cyanosis or clubbing. RLE immobilizer  Skin: c/d/w without rash   Resolved Hospital Problem list     Assessment & Plan:   Cardiogenic shock  Acute systolic heart failure  Stress induced cardiomyopathy  Recurrent Vfib/ Torsades with brief cardiac arrest  - LHC 10/25 with clean coronaries - patient has  been under major recent stress due to critical illness of her son - TTE 10/25 EF 20%, RV systolic function normal  P:  IABP per HF. Changed to 1:2 10/27. Hope is to wean further and dc Continue NE for MAP > 65 Trending PA hemodynamics per HF team  Lido gtt per HF. Rechecking lido level 10/27 (suspect 15.9 was drawn off of line infusing lido) Coreg spironolactone digoxin Goal Mag > 2.5, K > 4.5  Heparin per pharmacy   Acute encephalopathy after cardiac arrest - Following commands  P:  Supportive care RASS goal 0 to -1 Ongoing neuro checks Avoid fevers    Acute Hypoxic respiratory failure related to acute systolic heart failure, cardiac arrest Pulmonary edema  P:  Continue MV support Wean as able for goal SpO2 >94% WUA/SBT starting tomorrow 10/28.  VAP bundle PRN CXR, ABG   Thrombocytopenia -suspect due to IABP, HIT less likely P: HIT panel  Continue to trend CBC  Fever Elevated Ddimer -pct reassuring at <0.1 -TTE 10/25- no LV thrombus visualized but can not exclude given IABP P:  BCx obtained 10/26 continue to follow Starting empiric abx with vanc / cefepime. MRSA PCR is negative, can likel Trend WBC, fever curve    Hypocalcemia P:  Check ionized calcium 10/27, replete if needed  Mood Disorder -depression, anxiety - no concern from husband over accidental/ intentional OD -lamotrigine level WNL P:  Restarting home lamictal and PRN TID clonazepam per tube Starting home wellbutrin but at lower dose (300 mg vs home 450mg ) with close cardiac monitoring Continuing to hold vyvanse as cannot give via OGT  Best practice:  Diet: EN Pain/Anxiety/Delirium protocol (if indicated): Propofol, fent, PRN versed  VAP protocol (if indicated): yes DVT prophylaxis: heparin gtt  GI prophylaxis: pepcid per tube  Glucose control: CBG q4, add SSI if > 180 Mobility: bed Code Status: Full Family Communication: pending 10/27ICU  Disposition:  Labs   CBC: Recent Labs  Lab  02/05/20 2225 02/05/20 2225 02/06/20 0214 02/06/20 1009 02/07/20 0048 02/08/20 0306 02/08/20 0558  WBC 8.7  --  8.9  --  9.7 7.4 8.4  HGB 12.5   < > 13.2 13.3  13.3 12.1 10.3* 9.9*  HCT 39.7   < > 40.6 39.0  39.0 37.2 32.4* 31.2*  MCV 101.0*  --  96.2  --  96.1 97.9 97.2  PLT 203  --  215  --  143* 71* 75*   < > = values in this interval not displayed.    Basic Metabolic Panel: Recent Labs  Lab 02/06/20 0214 02/06/20 1009 02/06/20 1241 02/06/20 1543 02/07/20 0048 02/07/20 1442 02/08/20 0306  NA 139   < > 137 135 136 136 137  K 3.9   < > 4.1 3.8 3.7 4.2 3.9  CL 108  --  106 104 107 108 107  CO2 25  --  24 23 22 22  21*  GLUCOSE 144*  --  120* 120* 124* 142* 128*  BUN 14  --  14 13 16 12 10   CREATININE 0.90  --  0.76 0.73 0.73 0.64 0.63  CALCIUM 7.8*  --  8.2* 8.0* 8.0* 7.7* 7.9*  MG 2.6*  --   --  2.3 2.2 2.3 2.0  PHOS 2.3*  --   --   --   --   --   --    < > = values in this interval not displayed.   GFR: Estimated Creatinine Clearance: 84.3 mL/min (by C-G formula based on SCr of 0.63 mg/dL). Recent Labs  Lab 02/05/20 1745 02/05/20 1745 02/05/20 1937 02/05/20 2225 02/05/20 2225 02/06/20 0110 02/06/20 0214 02/07/20 0048 02/07/20 1442 02/08/20 0306 02/08/20 0558  PROCALCITON  --   --   --   --   --   --   --   --  0.10 <0.10  --   WBC 8.1   < >  --  8.7   < >  --  8.9 9.7  --  7.4 8.4  LATICACIDVEN 3.8*  --  2.4* 1.3  --  1.9  --   --   --   --   --    < > = values in this interval not displayed.    Liver Function Tests: Recent Labs  Lab 02/05/20 2225 02/07/20 1442  AST 898* 119*  ALT 482* 210*  ALKPHOS 75 67  BILITOT 0.8 0.6  PROT 5.2* 4.9*  ALBUMIN 3.1* 2.8*   No results for input(s): LIPASE, AMYLASE in the last 168 hours. No results for input(s): AMMONIA in the last 168 hours.  ABG    Component Value Date/Time   PHART 7.298 (L) 02/05/2020 1853   PCO2ART 52.4 (H) 02/05/2020 1853   PO2ART 255 (H) 02/05/2020 1853   HCO3 23.9 02/06/2020  1009  HCO3 23.7 02/06/2020 1009   TCO2 25 02/06/2020 1009   TCO2 25 02/06/2020 1009   ACIDBASEDEF 1.0 02/05/2020 1853   O2SAT 82.3 02/08/2020 0306     Coagulation Profile: Recent Labs  Lab 02/05/20 1745  INR 1.0    Cardiac Enzymes: No results for input(s): CKTOTAL, CKMB, CKMBINDEX, TROPONINI in the last 168 hours.  HbA1C: Hgb A1c MFr Bld  Date/Time Value Ref Range Status  02/06/2020 12:41 PM 5.3 4.8 - 5.6 % Final    Comment:    (NOTE)         Prediabetes: 5.7 - 6.4         Diabetes: >6.4         Glycemic control for adults with diabetes: <7.0     CBG: Recent Labs  Lab 02/07/20 1142 02/07/20 1600 02/07/20 1954 02/08/20 0011 02/08/20 0305  GLUCAP 134* 144* 129* 117* 135*     CRITICAL CARE Performed by: Lanier Clam  Total critical care time: 50 minutes  Critical care time was exclusive of separately billable procedures and treating other patients. Critical care was necessary to treat or prevent imminent or life-threatening deterioration.  Critical care was time spent personally by me on the following activities: development of treatment plan with patient and/or surrogate as well as nursing, discussions with consultants, evaluation of patient's response to treatment, examination of patient, obtaining history from patient or surrogate, ordering and performing treatments and interventions, ordering and review of laboratory studies, ordering and review of radiographic studies, pulse oximetry and re-evaluation of patient's condition.  Tessie Fass MSN, AGACNP-BC [facility] [phone] If no answer, [phone] 02/08/2020, 7:07 AM    Pulmonary critical care attending:  This is a 54 year old female witnessed V. fib, V. tach cardiac arrest diagnosed with stress-induced cardiomyopathy, cardiogenic shock requiring intubation mechanical ventilation, intra-aortic balloon pump placement.  Slowly weaning off vasopressors.  Now weaning  intra-aortic balloon pump.  Remains intubated on mechanical life support sedated on propofol.  Updated patient's husband today at bedside answered all questions best of my ability.  BP 104/60   Pulse 71   Temp 100 F (37.8 C)   Resp (!) 22   Ht  (1.651 m)   Wt 75 kg   LMP  (LMP Unknown)   SpO2 100%   BMI 27.51 kg/m   General: Middle-aged female intubated mechanical life support HEENT: NCAT, tracking appropriately Neuro: Following commands able to answer basic questions. Heart: Regular rhythm S1-S2 Lungs: Bilateral mechanically Breath sounds Labs: Reviewed Chest x-ray: Stable lines and tubes.  Assessment: Acute hypoxemic respiratory failure requiring intubation mechanical ventilation secondary to bilateral pulmonary edema Cardiogenic shock Stress-induced cardiomyopathy, status post intra-aortic balloon pump. Recurrent VT Brief cardiac arrest. Possible aspiration pneumonia developing, fevers  Plan: Continue to wean PEEP and FiO2 as tolerated maintain SPO2 greater than 90%. Wean off intra-aortic balloon pump today. When she appears stable off of balloon pump would consider liberation from ventilator. Plan for extubation possibly tomorrow morning. Follow urine output Diuresis to maintain euvolemia Goal-directed heart failure regimen per cardiology services. Ensure electrolytes stable Start antibiotics today Cultures  This patient is critically ill with multiple organ system failure; which, requires frequent high complexity decision making, assessment, support, evaluation, and titration of therapies. This was completed through the application of advanced monitoring technologies and extensive interpretation of multiple databases. During this encounter critical care time was devoted to patient care services described in this note for 33 minutes.  Josephine Igo, DO [facility] 02/08/2020 1:57 PM

## 2020-02-08 NOTE — Progress Notes (Addendum)
Progress Note  Patient Name: Kristina Nichols Date of Encounter: 02/08/2020  [facility] Cardiologist: new to [facility]  Subjective   Intubated and sedated, opens eyes to verbal  Inpatient Medications    Scheduled Meds: . carvedilol  3.125 mg Per Tube BID WC  . chlorhexidine gluconate (MEDLINE KIT)  15 mL Mouth Rinse BID  . Chlorhexidine Gluconate Cloth  6 each Topical Daily  . digoxin  0.125 mg Oral Daily  . docusate  100 mg Per Tube BID  . famotidine  20 mg Per Tube BID  . feeding supplement (PROSource TF)  45 mL Per Tube QID  . mouth rinse  15 mL Mouth Rinse 10 times per day  . polyethylene glycol  17 g Per Tube Daily  . sodium chloride flush  10-40 mL Intracatheter Q12H  . sodium chloride flush  3 mL Intravenous Q12H  . spironolactone  12.5 mg Per Tube Daily   Continuous Infusions: . sodium chloride    . sodium chloride 10 mL/hr at 02/08/20 0400  . calcium gluconate    . dexmedetomidine (PRECEDEX) IV infusion Stopped (02/07/20 1040)  . feeding supplement (VITAL AF 1.2 CAL) 40 mL/hr at 02/08/20 0400  . heparin 950 Units/hr (02/08/20 0400)  . lidocaine 2 mg/min (02/08/20 0400)  . norepinephrine (LEVOPHED) Adult infusion 1.5 mcg/min (02/08/20 0400)  . propofol (DIPRIVAN) infusion 60 mcg/kg/min (02/08/20 0400)   PRN Meds: sodium chloride, acetaminophen, fentaNYL (SUBLIMAZE) injection, influenza vac split quadrivalent PF, midazolam, polyethylene glycol, sodium chloride flush, sodium chloride flush   Vital Signs    Vitals:   02/08/20 0515 02/08/20 0530 02/08/20 0545 02/08/20 0600  BP:  (!) 121/100  106/67  Pulse: 68 (!) 25 66 64  Resp: (!) 22 (!) 22 (!) 22 (!) 22  Temp: 99.9 F (37.7 C) 99.9 F (37.7 C) 99.9 F (37.7 C)   TempSrc:      SpO2: 100% 100% 100% 100%  Weight:      Height:        Intake/Output Summary (Last 24 hours) at 02/08/2020 0721 Last data filed at 02/08/2020 0630 Gross per 24 hour  Intake 2706.72 ml  Output 2640 ml  Net 66.72 ml   Last 3  Weights 02/08/2020 02/07/2020 02/06/2020  Weight (lbs) 165 lb 5.5 oz 163 lb 12.8 oz 161 lb 13.1 oz  Weight (kg) 75 kg 74.3 kg 73.4 kg      Telemetry    SR 70's generally, last VT (TdP) was nonsustained episode about 1330 yesterday - Personally Reviewed  ECG    No new EKGs - Personally Reviewed  Physical Exam   GEN: intubated and sedated but wakes to verbal Neck: JVP 6-7 Cardiac: RRR, no murmurs, rubs, or gallops.  Respiratory: clear/intubated GI: Soft, nontender, non-distended  MS: No edema; No deformity. Neuro:  unable to assess Psych: unable to assess  Feet are cool, not cyanotic, + doppler pulses  Labs    High Sensitivity Troponin:   Recent Labs  Lab 02/05/20 1745 02/05/20 1929 02/05/20 2107 02/05/20 2225  TROPONINIHS 33* 88* 119* 232*      Chemistry Recent Labs  Lab 02/05/20 2225 02/06/20 0214 02/07/20 0048 02/07/20 1442 02/08/20 0306  NA 137   < > 136 136 137  K 4.9   < > 3.7 4.2 3.9  CL 109   < > 107 108 107  CO2 22   < > 22 22 21*  GLUCOSE 199*   < > 124* 142* 128*  BUN 16   < >  16 12 10   CREATININE 0.94   < > 0.73 0.64 0.63  CALCIUM 7.0*   < > 8.0* 7.7* 7.9*  PROT 5.2*  --   --  4.9*  --   ALBUMIN 3.1*  --   --  2.8*  --   AST 898*  --   --  119*  --   ALT 482*  --   --  210*  --   ALKPHOS 75  --   --  67  --   BILITOT 0.8  --   --  0.6  --   GFRNONAA >60   < > >60 >60 >60  ANIONGAP 6   < > 7 6 9    < > = values in this interval not displayed.     Hematology Recent Labs  Lab 02/07/20 0048 02/08/20 0306 02/08/20 0558  WBC 9.7 7.4 8.4  RBC 3.87 3.31* 3.21*  HGB 12.1 10.3* 9.9*  HCT 37.2 32.4* 31.2*  MCV 96.1 97.9 97.2  MCH 31.3 31.1 30.8  MCHC 32.5 31.8 31.7  RDW 13.8 13.9 13.9  PLT 143* 71* 75*    BNPNo results for input(s): BNP, PROBNP in the last 168 hours.   DDimer  Recent Labs  Lab 02/05/20 1937  DDIMER >20.00*     Radiology    CT Head Wo Contrast  Result Date: 02/05/2020 CLINICAL DATA:  54 year old female with  altered mental status post cardiac arrest. EXAM: CT HEAD WITHOUT CONTRAST TECHNIQUE: Contiguous axial images were obtained from the base of the skull through the vertex without intravenous contrast. COMPARISON:  None. FINDINGS: Brain: No evidence of acute infarction, hemorrhage, hydrocephalus, extra-axial collection or mass lesion/mass effect. Vascular: No hyperdense vessel or unexpected calcification. Skull: Normal. Negative for fracture or focal lesion. Sinuses/Orbits: No acute finding. Other: Oral intubation noted. IMPRESSION: No evidence of acute intracranial abnormality. Electronically Signed   By: Margarette Canada M.D.   On: 02/05/2020 18:48   DG Chest Port 1 View Result Date: 02/05/2020 CLINICAL DATA:  Status post CPR. EXAM: PORTABLE CHEST 1 VIEW COMPARISON:  February 05, 2020 (6:03 p.m.) FINDINGS: There is stable endotracheal tube and nasogastric tube positioning. Mild diffusely increased interstitial lung markings are seen with moderate to marked severity patchy left suprahilar and right upper lobe infiltrates. Mild bilateral lower lobe infiltrates are also seen. These areas are mildly increased in severity when compared to the prior study. The heart size and mediastinal contours are within normal limits. The visualized skeletal structures are unremarkable. IMPRESSION: Moderate to marked severity bilateral patchy infiltrates, as described above, with mild interval increase in severity when compared to the prior exam. Electronically Signed   By: Virgina Norfolk M.D.   On: 02/05/2020 20:25   DG Chest Portable 1 View  Result Date: 02/05/2020 CLINICAL DATA:  Post intubation, witnessed cardiac arrest and CPR EXAM: PORTABLE CHEST 1 VIEW COMPARISON:  None FINDINGS: Endotracheal tube terminates between the clavicles approximately 4 cm above the carina. Gastric tube courses through in a off the field of the radiograph, side port in the region of the mid stomach, tip off the field of view. Pacer defibrillator  pads project over the patient. Upper lobe opacities are present with some elevation of the minor fissure in the RIGHT chest. Less opacity at the LEFT lung apex. On limited assessment no acute skeletal process. IMPRESSION: 1. Endotracheal tube terminates between the clavicles approximately 4 cm above the carina. 2. Suggested volume loss and consolidative changes in the RIGHT upper lobe greater  than LEFT upper lobe. Findings could be related to volume loss with pneumonitis from aspiration and potential background of asymmetric pulmonary edema or pneumonia. Underlying mass would be difficult to exclude. Follow-up after resolution of acute symptoms is suggested to exclude underlying lesion. 3. Gastric tube courses through a off the field of the radiograph, side port in the region of the mid stomach, tip off the field of view. Electronically Signed   By: Zetta Bills M.D.   On: 02/05/2020 18:20    Cardiac Studies    02/06/2020: TTE IMPRESSIONS  1. No LV thrombus visualized, thought cannot excluded given LV substrate.  Left ventricular ejection fraction, by estimation, is <20%. The left  ventricle has severely decreased function. The left ventricle demonstrates  global hypokinesis. Left  ventricular diastolic parameters are consistent with Grade I diastolic  dysfunction (impaired relaxation).  2. Right ventricular systolic function is normal. The right ventricular  size is normal.  3. The mitral valve is grossly normal. No evidence of mitral valve  regurgitation.  4. The aortic valve was not well visualized. Aortic valve regurgitation  is trivial.  5. Aortic IABP in Descending Aorta.  6. The inferior vena cava is dilated in size with <50% respiratory  variability, suggesting right atrial pressure of 15 mmHg.   Comparison(s): No prior Echocardiogram.    02/06/2020: RHC, IABP insertion 1. Mildly elevated left and right heart filling pressures.  2. Low cardiac output by thermodilution on  norepinephrine 4, preserved output by Fick.  3. Successful IABP placement.  4. Swan left in place right IJ.     02/06/2020: LHC  There is severe left ventricular systolic dysfunction.  LV end diastolic pressure is moderately elevated.  The left ventricular ejection fraction is less than 25% by visual estimate.  There is no mitral valve regurgitation.  1. No angiographic evidence of CAD 2. Severe segmental LV systolic dysfunction with wall motion suggesting Takotsubo's cardiomyopathy (stress induced cardiomyopathy).  3. Elevated LVEDP 4. Ventricular fibrillation/Torsades-Temporary Pacemaker placed per request of admitting/consulting team in case of need of overdrive pacing tonight  Recommendations: No further ischemic workup. Management of VF/Torsades per EP team in the am. Medical management of likely stress induced cardiomyopathy.     Patient Profile     54 y.o. female with a hx of depression/anxiety, hypothyroidism (off tx), GERD, chronic granulomatosis carrier (?), no noted past cardiac history admitted with VF arrest  She has had recurrent TdP requiring multiple defibrillations  Assessment & Plan    1. Torsades 2. Cardiogenic shock 3. Respiratory failure      Her last defibrillation was Monday AM 0830 (while we were at bedside)      On nonsustained episode approx 1330 Monday      intermittently she has increased frequency of PVCs, these continue      Good urine OP  No CAD by LHC LVEF <20%, suspect Takotsbo Reduced CO by RHC S/p IABP @ 1:1 this AM Temp pacing wire is OUT Remains on lidocaine gtt @ 2 heparin gtt Levo @ 1    started on low dose coreg, dig and aldactone yesterday   Coox this AM 82.3 FEBRILE, Tmax this morning 101.5 Abnormal CXR this AM > deferred to AHF and CCM  Labs look OK, K+ 3.9  Mag 2.0  LIDO LEVEL  15.9, suspect drawn from line >> ordered redraw stat d/w RN PLTS are down 70's H/H trending down >> d/w AHF APP sending HIT labs  On  telemetry she has waxing/waning  QT prolongation Not at baseline    Monitor closely when weaning from IABP and vent planned If lidocaine level is truly elevated, we can reduce dose or switch to amio if needed Will get EKG today  Dr. Quentin Ore will see later today   For questions or updates, please contact [facility] Please consult [email or web address] for contact info under        Signed, Baldwin [place], PA-C  02/08/2020, 7:21 AM

## 2020-02-08 NOTE — Progress Notes (Addendum)
Advanced Heart Failure Rounding Note  PCP-Cardiologist: No primary care provider on file.   Subjective:    10/25 S/P IABP.  Temp pacer removed.   IABP 1:1   On Lidocaine 2 mcg + Norepi 1 mcg. Lidocaine level 3.8   Remains on vent. Follows commands   CO-OX 82% PAP: (17-42)/(8-33) 26/11 CVP:  [3 mmHg-14 mmHg] 8 mmHg PCWP:  [11 mmHg-15 mmHg] 11 mmHg CO:  [4.5 L/min-7.1 L/min] 4.5 L/min CI:  [2.5 L/min/m2-3.9 L/min/m2] 2.5 L/min/m2   Objective:   Weight Range: 75 kg Body mass index is 27.51 kg/m.   Vital Signs:   Temp:  [99.1 F (37.3 C)-101.5 F (38.6 C)] 99.9 F (37.7 C) (10/27 0545) Pulse Rate:  [25-86] 64 (10/27 0600) Resp:  [16-23] 22 (10/27 0600) BP: (75-145)/(55-109) 106/67 (10/27 0600) SpO2:  [96 %-100 %] 100 % (10/27 0600) Arterial Line BP: (79-163)/(55-79) 120/67 (10/27 0600) FiO2 (%):  [40 %] 40 % (10/27 0400) Weight:  [75 kg] 75 kg (10/27 0419) Last BM Date:  (Unknown; PTA)  Weight change: Filed Weights   02/06/20 0000 02/07/20 0500 02/08/20 0419  Weight: 73.4 kg 74.3 kg 75 kg    Intake/Output:   Intake/Output Summary (Last 24 hours) at 02/08/2020 0709 Last data filed at 02/08/2020 0630 Gross per 24 hour  Intake 2706.72 ml  Output 2640 ml  Net 66.72 ml      Physical Exam   General:  On vent.  HEENT: ETT Neck: supple. no JVD. Carotids 2+ bilat; no bruits. No lymphadenopathy or thryomegaly appreciated. Cor: PMI nondisplaced. Regular rate & rhythm. No rubs, gallops or murmurs. Lungs: clear Abdomen: soft, nontender, nondistended. No hepatosplenomegaly. No bruits or masses. Good bowel sounds. Extremities: cool, no cyanosis, clubbing, rash, edema. R groin IABP site ok. Able to doppler R and L pedal pulse.  Neuro: On vent wakes up and follows commands. MAE x4   Telemetry  SR with occasional PVCs 60-80s   EKG    N/a   Labs    CBC Recent Labs    02/08/20 0306 02/08/20 0558  WBC 7.4 8.4  HGB 10.3* 9.9*  HCT 32.4* 31.2*  MCV  97.9 97.2  PLT 71* 75*   Basic Metabolic Panel Recent Labs    02/06/20 0214 02/06/20 1009 02/07/20 1442 02/08/20 0306  NA 139   < > 136 137  K 3.9   < > 4.2 3.9  CL 108   < > 108 107  CO2 25   < > 22 21*  GLUCOSE 144*   < > 142* 128*  BUN 14   < > 12 10  CREATININE 0.90   < > 0.64 0.63  CALCIUM 7.8*   < > 7.7* 7.9*  MG 2.6*   < > 2.3 2.0  PHOS 2.3*  --   --   --    < > = values in this interval not displayed.   Liver Function Tests Recent Labs    02/05/20 2225 02/07/20 1442  AST 898* 119*  ALT 482* 210*  ALKPHOS 75 67  BILITOT 0.8 0.6  PROT 5.2* 4.9*  ALBUMIN 3.1* 2.8*   No results for input(s): LIPASE, AMYLASE in the last 72 hours. Cardiac Enzymes No results for input(s): CKTOTAL, CKMB, CKMBINDEX, TROPONINI in the last 72 hours.  BNP: BNP (last 3 results) No results for input(s): BNP in the last 8760 hours.  ProBNP (last 3 results) No results for input(s): PROBNP in the last 8760 hours.   D-Dimer  Recent Labs    02/05/20 1937  DDIMER >20.00*   Hemoglobin A1C Recent Labs    02/06/20 1241  HGBA1C 5.3   Fasting Lipid Panel Recent Labs    02/08/20 0306  TRIG 85   Thyroid Function Tests Recent Labs    02/05/20 2118  TSH 7.724*    Other results:   Imaging    DG CHEST PORT 1 VIEW  Result Date: 02/07/2020 CLINICAL DATA:  54 year old female with heart failure. EXAM: PORTABLE CHEST 1 VIEW COMPARISON:  Earlier radiograph dated 02/07/2020. FINDINGS: Endotracheal tube, enteric tube, and Swan-Ganz in similar position. There has been interval development of an area of opacity in the right upper lung field with overall decreased volume consistent with lobar atelectasis or collapse. Clinical correlation is recommended. No large pleural effusion. No pneumothorax. Stable cardiac silhouette. No acute osseous pathology. IMPRESSION: Interval development of lobar atelectasis or collapse of the right upper lung field. Follow-up recommended. Electronically  Signed   By: Anner Crete M.D.   On: 02/07/2020 18:20   DG Chest Port 1 View  Result Date: 02/07/2020 CLINICAL DATA:  Post cardiac arrest. EXAM: PORTABLE CHEST 1 VIEW COMPARISON:  02/05/2020 FINDINGS: Endotracheal tube in good position. NG tube enters the stomach with the tip not visualized. Involve placement of right jugular Swan-Ganz catheter. Swan-Ganz catheter tip right lower lobe pulmonary artery. No pneumothorax. Improvement in bilateral infiltrates compared with the prior study. No effusion. IMPRESSION: Swan-Ganz catheter tip right lower lobe pulmonary artery. No pneumothorax. Improvement in bilateral airspace disease likely edema. Electronically Signed   By: Franchot Gallo M.D.   On: 02/07/2020 08:35   VAS [place] LOWER EXTREMITY VENOUS (DVT)  Result Date: 02/07/2020  Lower Venous DVTStudy Indications: Elevated ddimer.  Limitations: Line and bandages. Comparison Study: no prior Performing Technologist: Abram Sander RVS  Examination Guidelines: A complete evaluation includes B-mode imaging, spectral Doppler, color Doppler, and power Doppler as needed of all accessible portions of each vessel. Bilateral testing is considered an integral part of a complete examination. Limited examinations for reoccurring indications may be performed as noted. The reflux portion of the exam is performed with the patient in reverse Trendelenburg.  +---------+---------------+---------+-----------+----------+-------------------+ RIGHT    CompressibilityPhasicitySpontaneityPropertiesThrombus Aging      +---------+---------------+---------+-----------+----------+-------------------+ CFV                                                   not visualized due                                                        to line             +---------+---------------+---------+-----------+----------+-------------------+ SFJ                                                   not visualized due  to line             +---------+---------------+---------+-----------+----------+-------------------+ FV Prox  Full                                                             +---------+---------------+---------+-----------+----------+-------------------+ FV Mid   Full                                                             +---------+---------------+---------+-----------+----------+-------------------+ FV DistalFull                                                             +---------+---------------+---------+-----------+----------+-------------------+ PFV      Full                                                             +---------+---------------+---------+-----------+----------+-------------------+ POP      Full           Yes      Yes                                      +---------+---------------+---------+-----------+----------+-------------------+ PTV      Full                                                             +---------+---------------+---------+-----------+----------+-------------------+ PERO     Full                                                             +---------+---------------+---------+-----------+----------+-------------------+   +---------+---------------+---------+-----------+----------+--------------+ LEFT     CompressibilityPhasicitySpontaneityPropertiesThrombus Aging +---------+---------------+---------+-----------+----------+--------------+ CFV      Full           Yes      Yes                                 +---------+---------------+---------+-----------+----------+--------------+ SFJ      Full                                                        +---------+---------------+---------+-----------+----------+--------------+  FV Prox  Full                                                         +---------+---------------+---------+-----------+----------+--------------+ FV Mid   Full                                                        +---------+---------------+---------+-----------+----------+--------------+ FV DistalFull                                                        +---------+---------------+---------+-----------+----------+--------------+ PFV      Full                                                        +---------+---------------+---------+-----------+----------+--------------+ POP      Full           Yes      Yes                                 +---------+---------------+---------+-----------+----------+--------------+ PTV      Full                                                        +---------+---------------+---------+-----------+----------+--------------+ PERO     Full                                                        +---------+---------------+---------+-----------+----------+--------------+     *See table(s) above for measurements and observations. Electronically signed by Deitra Mayo MD on 02/07/2020 at 5:38:04 PM.    Final      Medications:     Scheduled Medications: . carvedilol  3.125 mg Per Tube BID WC  . chlorhexidine gluconate (MEDLINE KIT)  15 mL Mouth Rinse BID  . Chlorhexidine Gluconate Cloth  6 each Topical Daily  . digoxin  0.125 mg Oral Daily  . docusate  100 mg Per Tube BID  . famotidine  20 mg Per Tube BID  . feeding supplement (PROSource TF)  45 mL Per Tube QID  . mouth rinse  15 mL Mouth Rinse 10 times per day  . polyethylene glycol  17 g Per Tube Daily  . sodium chloride flush  10-40 mL Intracatheter Q12H  . sodium chloride flush  3 mL Intravenous Q12H  . spironolactone  12.5 mg Per Tube Daily    Infusions: . sodium chloride    . sodium chloride 10 mL/hr at  02/08/20 0400  . calcium gluconate    . dexmedetomidine (PRECEDEX) IV infusion Stopped (02/07/20 1040)  . feeding supplement  (VITAL AF 1.2 CAL) 40 mL/hr at 02/08/20 0400  . heparin 950 Units/hr (02/08/20 0400)  . lidocaine 2 mg/min (02/08/20 0400)  . norepinephrine (LEVOPHED) Adult infusion 1.5 mcg/min (02/08/20 0400)  . propofol (DIPRIVAN) infusion 60 mcg/kg/min (02/08/20 0400)    PRN Medications: sodium chloride, acetaminophen, fentaNYL (SUBLIMAZE) injection, influenza vac split quadrivalent PF, midazolam, polyethylene glycol, sodium chloride flush, sodium chloride flush    Patient Profile  Kristina Nichols is a 54 year old with h/o witnessed cardiac arrest at homewith refractory vib/ torsades. Intubated in ER. Taken for [facility] with clean coronaries, findings consistent with stress induced cardiomyopathy, LVEF <25%.   Cardiogenic shock/torsades. Multiple shocks.   Assessment/Plan  1. V Fib /V tachArrest- refractory torsades -Cath last night with normal coronaries. Temp wire removed 10/25.  -Multiple shocks. Last shock 02/06/20.  -EP consulted. Attempted to RV pace but went back in torsades.  - Was on amio drip initially but stopped due to prolonged Qtc.  - Continue lidocaine drip 2 mg per hour. Lidocaine level 15.9 but there is concern about the lab drawn from line. Will check lidocaine level with peripheral stick now. If still high will need to stop lidocaine and consider amiodarone drip.   - Per EP -Should she have increased ectopy with extubation could consider salvage ablation.  - Later would consider ICD prior to d/c once she has had significant improvement.  - K and Mag ok.   2. Cardiogenic Shock /Acute Systolic HF -Cath no coronary disease, LV gram concerning for [place], EF 20%. - ECHO 10/25 RV ok, LV 20%.   - Elevated LFTs in setting of shock. - Start to wean IABP 1:2, Hemodynmaics ok today. Plan to remove IABP later today if stable.  - Platelets down to 71. On Heparin drip. -Currently on norepi 1 mcg.  - Continue coreg 3.125 mg twice a day.  - Increase spiro to 25 mg daily.   - Renal function  stable.    3. Acute Respiratory Failure -Vent per CCM  - CXR this morning.   4. Depression On wellbutrin/klonipin prior to admit.   5. Elevated LFTs Suspect in the setting of shock - Check LFTs.   6. ID Blood Cultures obtained 10/26--> NGTD Febrile T 101.5 . Adding vanc and cefepime.  Pro calcitonin < 0.1 WBC 9.9 today.  7. Anemia  -Hgb trending down. 13.3>12>10.3  - No obvious source.  8. Thrombocytopenia  -Platelets trending down to 143>71  - Obtain HIT  Repeat CXR today.   Length of Stay: 3  Amy Clegg, NP  02/08/2020, 7:09 AM  Advanced Heart Failure Team Pager [phone] (M-F; 7a - 4p)  Please contact [facility] for night-coverage after hours (4p -7a ) and weekends on [email or web address]  Patient seen with NP, agree with the above note .  This morning, IABP 1:1 weaned to 1:2 and now 1:3.  She is tolerating wean.  She is off NE. Plts lower today at 75K, remains on heparin gtt for IABP.   PVCs but no VT/NSVT.  Remains on lidocaine, elevated level today but not sure this was accurate.   Swan numbers (my measurements): CVP 8 PA 32/13 Mean PCWP 11 CI 3.1 Co-ox 82%  PCT < 0.1 but febrile to 101.5.  CXR yesterday with RUL opacification, resolved on CXR today.   General: NAD Neck: No JVD, no thyromegaly or thyroid nodule.  Lungs: Decreased at bases.  CV: Nondisplaced PMI.  Heart regular S1/S2, no S3/S4, IABP sounds.  No peripheral edema.   Abdomen: Soft, nontender, no hepatosplenomegaly, no distention.  Skin: Intact without lesions or rashes.  Neurologic: Wakes up and moves all extremities.  Extremities: No clubbing or cyanosis.  HEENT: Normal.   Hemodynamically stable with no further VT/NSVT.   - Remove IABP this afternoon, has been weaned to 1:3 and heparin gtt off.  - Now off NE.  - Continue Coreg 3.125 mg bid and digoxin 0.125 daily, add spironolactone 12.5 daily.  - Normal CVP and PCWP, do not need diuretic right now.   With fever and RUL  opacification, starting empiric vancomycin/cefepime.  Cultures sent. Interestingly, RUL looked normal on repeat CXR today. ?Resolution of mucus plugging.   Lidocaine continues, think initial level in accurate (pulled from line where lido infusing).  Stat repeat still pending.  Platelets falling, may be due to IABP but will send HIT.  Heparin now off in preparation to pull IABP, will not need to restart.   CRITICAL CARE Performed by: Loralie Champagne  Total critical care time: 35 minutes  Critical care time was exclusive of separately billable procedures and treating other patients.  Critical care was necessary to treat or prevent imminent or life-threatening deterioration.  Critical care was time spent personally by me on the following activities: development of treatment plan with patient and/or surrogate as well as nursing, discussions with consultants, evaluation of patient's response to treatment, examination of patient, obtaining history from patient or surrogate, ordering and performing treatments and interventions, ordering and review of laboratory studies, ordering and review of radiographic studies, pulse oximetry and re-evaluation of patient's condition.  Loralie Champagne 02/08/2020 2:13 PM

## 2020-02-08 NOTE — Progress Notes (Signed)
ANTICOAGULATION CONSULT NOTE Pharmacy Consult for heparin  Indication: IABP  No Known Allergies  Patient Measurements: Height: 5\' 5"  (165.1 cm) Weight: 74.3 kg (163 lb 12.8 oz) IBW/kg (Calculated) : 57 Heparin Dosing Weight: 70kg  Vital Signs: Temp: 101.3 F (38.5 C) (10/27 0200) Temp Source: Core (10/27 0000) BP: 119/96 (10/27 0100) Pulse Rate: 86 (10/27 0240)  Labs: Recent Labs    02/05/20 1745 02/05/20 1745 02/05/20 1807 02/05/20 1929 02/05/20 2107 02/05/20 2225 02/05/20 2225 02/06/20 0214 02/06/20 0214 02/06/20 1009 02/06/20 1241 02/06/20 1543 02/06/20 2104 02/07/20 0048 02/07/20 0827 02/07/20 1442 02/08/20 0306  HGB 13.3   < >   < >  --   --  12.5   < > 13.2   < > 13.3  13.3  --   --   --  12.1  --   --   --   HCT 42.7   < >   < >  --   --  39.7   < > 40.6  --  39.0  39.0  --   --   --  37.2  --   --   --   PLT 180   < >  --   --   --  203  --  215  --   --   --   --   --  143*  --   --   --   LABPROT 12.9  --   --   --   --   --   --   --   --   --   --   --   --   --   --   --   --   INR 1.0  --   --   --   --   --   --   --   --   --   --   --   --   --   --   --   --   HEPARINUNFRC  --   --   --   --   --   --   --   --   --   --   --   --  0.48  --  0.26*  --  0.16*  CREATININE 1.18*   < >   < >  --  0.83 0.94   < > 0.90  --   --    < > 0.73  --  0.73  --  0.64  --   TROPONINIHS 33*  --    < > 88* 119* 232*  --   --   --   --   --   --   --   --   --   --   --    < > = values in this interval not displayed.    Estimated Creatinine Clearance: 83.9 mL/min (by C-G formula based on SCr of 0.64 mg/dL).  Assessment: 54 y.o. female with IABP in place for heparin  Goal of Therapy:  Heparin level 0.2-0.5 units/ml Monitor platelets by anticoagulation protocol: Yes   Plan:  Increase Heparin 950 units/hr  44, PharmD, BCPS  02/08/2020 3:42 AM

## 2020-02-08 NOTE — Progress Notes (Signed)
Balloon pump removal and 27f venous sheath removed   Start time 1418 Finish time 1445  Distal pulses present via doppler at 1445  HR: 72 BP: 115/65 RR: ventilator  Instructions not given to patient due to sedation

## 2020-02-09 LAB — CBC
HCT: 29 % — ABNORMAL LOW (ref 36.0–46.0)
Hemoglobin: 9.2 g/dL — ABNORMAL LOW (ref 12.0–15.0)
MCH: 30.8 pg (ref 26.0–34.0)
MCHC: 31.7 g/dL (ref 30.0–36.0)
MCV: 97 fL (ref 80.0–100.0)
Platelets: 68 10*3/uL — ABNORMAL LOW (ref 150–400)
RBC: 2.99 MIL/uL — ABNORMAL LOW (ref 3.87–5.11)
RDW: 14.1 % (ref 11.5–15.5)
WBC: 6.6 10*3/uL (ref 4.0–10.5)
nRBC: 0 % (ref 0.0–0.2)

## 2020-02-09 LAB — BASIC METABOLIC PANEL
Anion gap: 6 (ref 5–15)
BUN: 15 mg/dL (ref 6–20)
CO2: 23 mmol/L (ref 22–32)
Calcium: 8.2 mg/dL — ABNORMAL LOW (ref 8.9–10.3)
Chloride: 108 mmol/L (ref 98–111)
Creatinine, Ser: 0.62 mg/dL (ref 0.44–1.00)
GFR, Estimated: >60 mL/min (ref >60)
Glucose, Bld: 113 mg/dL — ABNORMAL HIGH (ref 70–99)
Potassium: 4.2 mmol/L (ref 3.5–5.1)
Sodium: 137 mmol/L (ref 135–145)

## 2020-02-09 LAB — GLUCOSE, CAPILLARY
Glucose-Capillary: 121 mg/dL — ABNORMAL HIGH (ref 70–99)
Glucose-Capillary: 135 mg/dL — ABNORMAL HIGH (ref 70–99)
Glucose-Capillary: 153 mg/dL — ABNORMAL HIGH (ref 70–99)
Glucose-Capillary: 70 mg/dL (ref 70–99)
Glucose-Capillary: 88 mg/dL (ref 70–99)
Glucose-Capillary: 94 mg/dL (ref 70–99)

## 2020-02-09 LAB — HEPARIN INDUCED PLATELET AB (HIT ANTIBODY): Heparin Induced Plt Ab: 0.078 OD (ref 0.000–0.400)

## 2020-02-09 LAB — COOXEMETRY PANEL
Carboxyhemoglobin: 0.7 % (ref 0.5–1.5)
Methemoglobin: 1.3 % (ref 0.0–1.5)
O2 Saturation: 86.1 %
Total hemoglobin: 10.3 g/dL — ABNORMAL LOW (ref 12.0–16.0)

## 2020-02-09 LAB — LIDOCAINE LEVEL: Lidocaine Lvl: 4.3 ug/mL (ref 1.5–5.0)

## 2020-02-09 LAB — MAGNESIUM: Magnesium: 1.8 mg/dL (ref 1.7–2.4)

## 2020-02-09 LAB — CALCIUM, IONIZED: Calcium, Ionized, Serum: 4.8 mg/dL (ref 4.5–5.6)

## 2020-02-09 MED ORDER — POTASSIUM CHLORIDE 20 MEQ PO PACK
20.0000 meq | PACK | Freq: Once | ORAL | Status: AC
  Administered 2020-02-09: 20 meq
  Filled 2020-02-09: qty 1

## 2020-02-09 MED ORDER — LEVALBUTEROL TARTRATE 45 MCG/ACT IN AERO
1.0000 | INHALATION_SPRAY | RESPIRATORY_TRACT | Status: DC | PRN
  Filled 2020-02-09: qty 15

## 2020-02-09 MED ORDER — DIGOXIN 125 MCG PO TABS
0.1250 mg | ORAL_TABLET | Freq: Every day | ORAL | Status: DC
  Administered 2020-02-10 – 2020-02-13 (×4): 0.125 mg via ORAL
  Filled 2020-02-09 (×4): qty 1

## 2020-02-09 MED ORDER — DOCUSATE SODIUM 100 MG PO CAPS
100.0000 mg | ORAL_CAPSULE | Freq: Two times a day (BID) | ORAL | Status: DC
  Administered 2020-02-10 – 2020-02-15 (×9): 100 mg via ORAL
  Filled 2020-02-09 (×9): qty 1

## 2020-02-09 MED ORDER — BUPROPION HCL 100 MG PO TABS
100.0000 mg | ORAL_TABLET | Freq: Three times a day (TID) | ORAL | Status: DC
  Administered 2020-02-10 – 2020-02-15 (×16): 100 mg via ORAL
  Filled 2020-02-09 (×20): qty 1

## 2020-02-09 MED ORDER — CLONAZEPAM 0.5 MG PO TBDP
1.0000 mg | ORAL_TABLET | Freq: Three times a day (TID) | ORAL | Status: DC | PRN
  Administered 2020-02-09 – 2020-02-10 (×3): 1 mg via ORAL
  Filled 2020-02-09 (×3): qty 2

## 2020-02-09 MED ORDER — LEVALBUTEROL TARTRATE 45 MCG/ACT IN AERO: 6.0000 | INHALATION_SPRAY | RESPIRATORY_TRACT | Status: DC | PRN

## 2020-02-09 MED ORDER — ACETAMINOPHEN 325 MG PO TABS
650.0000 mg | ORAL_TABLET | ORAL | Status: DC | PRN
  Administered 2020-02-10 – 2020-02-13 (×4): 650 mg via ORAL
  Filled 2020-02-09 (×4): qty 2

## 2020-02-09 MED ORDER — METHYLPREDNISOLONE SODIUM SUCC 125 MG IJ SOLR
125.0000 mg | Freq: Once | INTRAMUSCULAR | Status: AC
  Administered 2020-02-09: 125 mg via INTRAVENOUS
  Filled 2020-02-09: qty 2

## 2020-02-09 MED ORDER — RACEPINEPHRINE HCL 2.25 % IN NEBU
INHALATION_SOLUTION | RESPIRATORY_TRACT | Status: AC
  Administered 2020-02-09: 0.5 mL
  Filled 2020-02-09: qty 0.5

## 2020-02-09 MED ORDER — LAMOTRIGINE 100 MG PO TABS
150.0000 mg | ORAL_TABLET | Freq: Two times a day (BID) | ORAL | Status: DC
  Administered 2020-02-10 – 2020-02-15 (×11): 150 mg via ORAL
  Filled 2020-02-09: qty 2
  Filled 2020-02-09 (×3): qty 1
  Filled 2020-02-09 (×2): qty 2
  Filled 2020-02-09 (×2): qty 1
  Filled 2020-02-09 (×2): qty 2
  Filled 2020-02-09: qty 1
  Filled 2020-02-09: qty 2
  Filled 2020-02-09: qty 1

## 2020-02-09 MED ORDER — LOSARTAN POTASSIUM 25 MG PO TABS
12.5000 mg | ORAL_TABLET | Freq: Every day | ORAL | Status: DC
  Administered 2020-02-09: 12.5 mg via ORAL
  Filled 2020-02-09: qty 1

## 2020-02-09 MED ORDER — POLYETHYLENE GLYCOL 3350 17 G PO PACK: 17.0000 g | PACK | Freq: Every day | ORAL | Status: DC | PRN

## 2020-02-09 MED ORDER — RACEPINEPHRINE HCL 2.25 % IN NEBU: 0.5000 mL | INHALATION_SOLUTION | Freq: Once | RESPIRATORY_TRACT | Status: DC

## 2020-02-09 MED ORDER — METHYLPREDNISOLONE SODIUM SUCC 40 MG IJ SOLR
40.0000 mg | Freq: Two times a day (BID) | INTRAMUSCULAR | Status: DC
  Administered 2020-02-09 – 2020-02-10 (×2): 40 mg via INTRAVENOUS
  Filled 2020-02-09 (×2): qty 1

## 2020-02-09 MED ORDER — ORAL CARE MOUTH RINSE
15.0000 mL | Freq: Two times a day (BID) | OROMUCOSAL | Status: DC
  Administered 2020-02-09 – 2020-02-15 (×12): 15 mL via OROMUCOSAL

## 2020-02-09 MED ORDER — CARVEDILOL 3.125 MG PO TABS
3.1250 mg | ORAL_TABLET | Freq: Two times a day (BID) | ORAL | Status: DC
  Administered 2020-02-10 – 2020-02-15 (×11): 3.125 mg via ORAL
  Filled 2020-02-09 (×11): qty 1

## 2020-02-09 MED ORDER — POLYETHYLENE GLYCOL 3350 17 G PO PACK: 17.0000 g | PACK | Freq: Every day | ORAL | Status: DC

## 2020-02-09 MED ORDER — SPIRONOLACTONE 25 MG PO TABS
25.0000 mg | ORAL_TABLET | Freq: Every day | ORAL | Status: DC
  Administered 2020-02-10 – 2020-02-15 (×6): 25 mg via ORAL
  Filled 2020-02-09 (×7): qty 1

## 2020-02-09 MED ORDER — FUROSEMIDE 10 MG/ML IJ SOLN
20.0000 mg | Freq: Once | INTRAMUSCULAR | Status: AC
  Administered 2020-02-09: 20 mg via INTRAVENOUS
  Filled 2020-02-09: qty 2

## 2020-02-09 MED ORDER — LEVALBUTEROL HCL 0.63 MG/3ML IN NEBU
INHALATION_SOLUTION | RESPIRATORY_TRACT | Status: AC
  Administered 2020-02-09: 0.63 mg
  Filled 2020-02-09: qty 3

## 2020-02-09 MED ORDER — MAGNESIUM SULFATE 2 GM/50ML IV SOLN
2.0000 g | Freq: Once | INTRAVENOUS | Status: AC
  Administered 2020-02-09: 2 g via INTRAVENOUS
  Filled 2020-02-09: qty 50

## 2020-02-09 NOTE — Evaluation (Signed)
Clinical/Bedside Swallow Evaluation Patient Details  Name: Kristina Nichols MRN: [id number] Date of Birth: June 20, 1965  Today's Date: 02/09/2020 Time: SLP Start Time (ACUTE ONLY): 1407 SLP Stop Time (ACUTE ONLY): 1425 SLP Time Calculation (min) (ACUTE ONLY): 18 min  Past Medical History:  Past Medical History:  Diagnosis Date   Anxiety    Depression    Hyperlipidemia    Hypothyroidism    Insomnia    Past Surgical History:  Past Surgical History:  Procedure Laterality Date   ABDOMINAL HYSTERECTOMY     CHOLECYSTECTOMY     IABP INSERTION N/A 02/06/2020   Procedure: IABP Insertion;  Surgeon: Laurey Morale, MD;  Location: [facility] INVASIVE CV LAB;  Service: Cardiovascular;  Laterality: N/A;   LEFT HEART CATH AND CORONARY ANGIOGRAPHY N/A 02/05/2020   Procedure: LEFT HEART CATH AND CORONARY ANGIOGRAPHY;  Surgeon: Kathleene Hazel, MD;  Location: [facility] INVASIVE CV LAB;  Service: Cardiovascular;  Laterality: N/A;   PARTIAL HYSTERECTOMY     RIGHT HEART CATH N/A 02/06/2020   Procedure: RIGHT HEART CATH;  Surgeon: Laurey Morale, MD;  Location: [facility] INVASIVE CV LAB;  Service: Cardiovascular;  Laterality: N/A;   TEMPORARY PACEMAKER N/A 02/05/2020   Procedure: TEMPORARY PACEMAKER;  Surgeon: Kathleene Hazel, MD;  Location: [facility] INVASIVE CV LAB;  Service: Cardiovascular;  Laterality: N/A;   HPI:  54 year old with h/o witnessed cardiac arrest at home with refractory vib/ torsades.  Intubated in ER.  Taken for [facility] with clean coronaries, findings consistent with stress induced cardiomyopathy, LVEF < 25%. ETT 10/24-10/28.    Assessment / Plan / Recommendation Clinical Impression  Pt clinically presenting with increased risk for aspiration post 4 day intubation. Significant dysphonia and reduced vocal intensity exhibited as well as deconditioning. Trialed single ice chips following diligent oral care. Pt with weakned oral propulsion, sluggish laryngeal elevation and intermittent throat  clearing concerning for reduced swallow safety and efficiency. Recommend NPO with medicines via alternative means. Pt may have single ice chips for comfort following oral care as tolerated. SLP will closely monitor for PO readiness.   SLP Visit Diagnosis: Dysphagia, unspecified (R13.10)    Aspiration Risk  Severe aspiration risk;Risk for inadequate nutrition/hydration    Diet Recommendation   NPO  Medication Administration: Via alternative means    Other  Recommendations Oral Care Recommendations: Oral care QID;Oral care prior to ice chip/H20   Follow up Recommendations        Frequency and Duration min 2x/week  2 weeks       Prognosis Prognosis for Safe Diet Advancement: Good Barriers to Reach Goals: Time post onset      Swallow Study   General Date of Onset: 02/05/20 HPI: 54 year old with h/o witnessed cardiac arrest at home with refractory vib/ torsades.  Intubated in ER.  Taken for [facility] with clean coronaries, findings consistent with stress induced cardiomyopathy, LVEF < 25%. ETT 10/24-10/28.  Type of Study: Bedside Swallow Evaluation Previous Swallow Assessment: none on file  Diet Prior to this Study: NPO Temperature Spikes Noted: No Respiratory Status: Nasal cannula History of Recent Intubation: Yes Length of Intubations (days): 4 days Date extubated: 02/09/20 Behavior/Cognition: Lethargic/Drowsy;Requires cueing Oral Cavity Assessment: Dry Oral Care Completed by SLP: Yes Oral Cavity - Dentition: Adequate natural dentition Vision: Functional for self-feeding Self-Feeding Abilities: Needs assist Patient Positioning: Upright in bed Baseline Vocal Quality: Aphonic;Low vocal intensity Volitional Cough: Weak Volitional Swallow: Unable to elicit    Oral/Motor/Sensory Function Overall Oral Motor/Sensory Function: Generalized oral weakness  Ice Chips Ice chips: Impaired Presentation: Spoon Oral Phase Impairments: Reduced labial seal;Reduced lingual  movement/coordination Oral Phase Functional Implications: Prolonged oral transit;Oral residue Pharyngeal Phase Impairments: Suspected delayed Swallow;Multiple swallows;Unable to trigger swallow;Throat Clearing - Delayed;Decreased hyoid-laryngeal movement   Thin Liquid Thin Liquid: Not tested    Nectar Thick Nectar Thick Liquid: Not tested   Honey Thick Honey Thick Liquid: Not tested   Puree Puree: Not tested   Solid     Solid: Not tested      Reade Trefz E Debbora Ang MA, CCC-SLP Acute Rehabilitation Services  02/09/2020,2:33 PM

## 2020-02-09 NOTE — Progress Notes (Addendum)
PCCM Interval progress note  54 yo F hospital day 3 after witnessed VF arrest with ROSC, acute systolic heart failure requiring IABP (now removed), respiratory failure requiring intubation (extubated 10/28).  Hr 83 RR 16 SpO2 97% BP 130/81   General: Middle aged F reclined in bed with anxiety  Pulm: Stridor and upper lobe wheeze. Intermittent scalene muscle use. Moderate thin clear secretions. Hoarse voice.  CV: RRR s1s2 no rgm Extremity:No edema, well perfused GI: soft ndnt GU: clear yellow urine Neuro: Awake, tired, oriented x 3. Following commands. Anxious    Extubated earlier today without complication.  During my afternoon follow up, patient with new wheezing and stridor  Acute respiratory failure with hypoxia, stridor P -xopenex, racemic, solumedrol  -d/w pccm attending, will trial bipap  -close ICU monitoring; at risk for possible re-intubation.     Discussed with patient and husband, at bedside   Additional CCT: 45 minutes  Tessie Fass MSN, AGACNP-BC  Pulmonary/Critical Care Medicine [phone] If no answer, [phone] 02/09/2020, 3:28 PM

## 2020-02-09 NOTE — Progress Notes (Addendum)
NAME:  Kristina Nichols, MRN:  [id number], DOB:  08/15/1965, LOS: 4 ADMISSION DATE:  02/05/2020, CONSULTATION DATE:  02/05/20 REFERRING MD: Freida Busman (ED), CHIEF COMPLAINT:  Cardiac arrest  Brief History   54 yo woman here after witnessed cardiac arrest at home with refractory vib/ torsades.  Intubated in ER.  Taken for [facility] with clean coronaries, findings consistent with stress induced cardiomyopathy, LVEF < 25%.     History of present illness   Witnessed loss of consciousness, possibly seizure activity by her husband.   7 min of CPR reported, including Chest compressions done by her husband who is reportedly an EMT.  EMS cardioverted her and transported her, reportedly gave her sedating medication for agitation and reaching for the NP airway in route.  She was immediately intubated in the ED for airway protection.  Initial work up was done, relatively stable initially.  Started on propofol  Developed refractory vfib/torsades in the ED.   Multiple cardioversions.   Mag 2gm given x 2 Lidocaine 100mg  given x1. Started on amiodarone gtt.  Per nurse received a total of 2L NS.  Tox screen pos for amphetamines, benzodiazepenes.  Mild hypotension, MAP mid 60s, following arrest. Cardiology has evaluated, patient being taken to cath lab for coronary angiography.    Past Medical History  HLD Depression/Anxiety - Klonopin, lamictal GERD Hypothyroidism - stopped med 08/2017 per last clinic note Insomnia - trazodone Hearing loss Endometriosis Gastroparesis Lymphedema Sp cholecystectomy, hyterectomy DOE 08/2017  Meds: wellbutrin, klonopin, neuronitn, advil, lamictal, omeprazole, levothyroxine, vyvanse   Significant Hospital Events   10/24 Admitted  10/25 IABP, Heart failure and EP consulted  10/27 IABP 1:2. plt down trending. Remains febrile, starting abx. IABP out 10/28 WUA/SBT  Consults:  Cardiology EP Adv HF  Procedures:  10/24 ETT >> 10/24 R femoral sheath >>10/25, temp pacer removed;  IABP inserted >> 10/25 R IJ cordis with PA catheter >>  Significant Diagnostic Tests:  10/24 [facility] >> neg  Cardiac cath 10/24 >>  There is severe left ventricular systolic dysfunction.  LV end diastolic pressure is moderately elevated.  The left ventricular ejection fraction is less than 25% by visual estimate.  There is no mitral valve regurgitation. 1. No angiographic evidence of CAD 2. Severe segmental LV systolic dysfunction with wall motion suggesting Takotsubo's cardiomyopathy (stress induced cardiomyopathy).  3. Elevated LVEDP 4. Ventricular fibrillation/Torsades-Temporary Pacemaker placed per request of admitting/consulting team in case of need of overdrive pacing tonight  10/25 [facility] >> 1. Mildly elevated left and right heart filling pressures.  2. Low cardiac output by thermodilution on norepinephrine 4, preserved output by Fick.  3. Successful IABP placement.  4. Swan left in place right IJ.  RA mean 12 RV 31/13 PA 33/19, mean 24 PCWP mean 17 Oxygen saturations: PA 73% AO 100% Cardiac Output (Thermo) 3.53 Cardiac Index (Thermo) 1.95 PVR 2 WU Cardiac Output (Fick) 4.95 Cardiac Index (Fick) 2.74  10/25 TTE >> 1. No LV thrombus visualized, thought cannot excluded given LV substrate.  Left ventricular ejection fraction, by estimation, is <20%. The left ventricle has severely decreased function. The left ventricle demonstrates global hypokinesis. Left  ventricular diastolic parameters are consistent with Grade I diastolic dysfunction (impaired relaxation).  2. Right ventricular systolic function is normal. The right ventricular size is normal.  3. The mitral valve is grossly normal. No evidence of mitral valve regurgitation.  4. The aortic valve was not well visualized. Aortic valve regurgitation is trivial.  5. Aortic IABP in Descending Aorta.  6. The  inferior vena cava is dilated in size with <50% respiratory variability, suggesting right atrial pressure of 15  mmHg.  Micro Data:  10/24 SARS2 >> neg 10/24 MRSA PCR >> neg 10/26 BCx 2 > no growth  Antimicrobials:  10/27> vanc 10/27> cefepime  Interim history/subjective:  IABP out Off NE Continues on lido Off heparin gtt  Intubated  Tolerating SBT well   Objective   Blood pressure (!) 90/52, pulse 66, temperature 99.7 F (37.6 C), resp. rate (!) 22, height 5\' 5"  (1.651 m), weight 73.4 kg, SpO2 100 %. PAP: (17-34)/(7-20) 24/11 CVP:  [1 mmHg-11 mmHg] 3 mmHg PCWP:  [11 mmHg-16 mmHg] 16 mmHg CO:  [4.9 L/min-6.6 L/min] 6.2 L/min CI:  [2.7 L/min/m2-3.6 L/min/m2] 3.4 L/min/m2  Vent Mode: PRVC FiO2 (%):  [40 %] 40 % Set Rate:  [22 bmp] 22 bmp Vt Set:  [450 mL] 450 mL PEEP:  [5 cmH20] 5 cmH20 Plateau Pressure:  [17 cmH20-20 cmH20] 17 cmH20   Intake/Output Summary (Last 24 hours) at 02/09/2020 0746 Last data filed at 02/09/2020 0600 Gross per 24 hour  Intake 3617.52 ml  Output 2850 ml  Net 767.52 ml   Filed Weights   02/07/20 0500 02/08/20 0419 02/09/20 0400  Weight: 74.3 kg 75 kg 73.4 kg   Examination: General:  Critically ill appearing middle aged F intubated lightly sedated, no distress but anxious appearing  HEENT: NCAT pink mmm RIJ CVC, ETT OGT secure  Neuro: Lightly sedated. Awakens, follows commands. Agitated  CV: rrr s1s2 no rgm cap refill brisk  PULM:  Symmetrical chest expansion, no accessory use on PSV/CPAP  GI: Soft round ndnt +bowel sounds Extremities: Symmetrical bulk and tone, no obvious joint deformity. No cyanosis or clubbing Skin: c/d/w without rash  Resolved Hospital Problem list     Assessment & Plan:   Cardiogenic shock, improved  Acute systolic heart failure  Stress induced cardiomyopathy  Recurrent Vfib/ Torsades with brief cardiac arrest  - LHC 10/25 with clean coronaries - patient has been under major recent stress due to critical illness of her son - TTE 10/25 EF 20%, RV systolic function normal  P:  IABP out  Off NE Lido per EP Coreg,  digoxin, spironolactone Heparin per pharmacy -- currently off   Acute encephalopathy after cardiac arrest - Following commands  P:  Supportive care RASS goal 0 to -1 Ongoing neuro checks Avoid fevers    Acute Hypoxic respiratory failure related to acute systolic heart failure, cardiac arrest Pulmonary edema  P:  WUA/SBT -- tolerating well Hope to extubate this morning. If unable, will re-evaluate this afternoon  Thrombocytopenia -suspect due to IABP, HIT less likely P: HIT panel pending Heparin currently off -- per pharmacy Continue to trend CBC  Fever, improving  Elevated Ddimer -pct reassuring at <0.1 -TTE 10/25- no LV thrombus visualized but can not exclude given IABP P:  Cont to follow cx data Narrow empiric abx -- with negative MRSA PCR, dc vanc  PRN APAP   Hypocalcemia P:  Check ionized calcium 10/27, replete if needed  Mood disorder -depression, anxiety P:  Restarting home lamictal and PRN TID clonazepam per tube Starting home wellbutrin but at lower dose (300 mg vs home 450mg ) with close cardiac monitoring Continuing to hold vyvanse as cannot give via OGT  Best practice:  Diet: EN Pain/Anxiety/Delirium protocol (if indicated): Propofol, fent, PRN versed  VAP protocol (if indicated): yes DVT prophylaxis: heparin gtt per pharmacy GI prophylaxis: pepcid per tube  Glucose control: CBG q4, add  SSI if > 180 Mobility: bed Code Status: Full Family Communication: husband updated at bedside 10/28   Disposition:  Labs   CBC: Recent Labs  Lab 02/06/20 0214 02/06/20 0214 02/06/20 1009 02/07/20 0048 02/08/20 0306 02/08/20 0558 02/09/20 0306  WBC 8.9  --   --  9.7 7.4 8.4 6.6  HGB 13.2   < > 13.3  13.3 12.1 10.3* 9.9* 9.2*  HCT 40.6   < > 39.0  39.0 37.2 32.4* 31.2* 29.0*  MCV 96.2  --   --  96.1 97.9 97.2 97.0  PLT 215  --   --  143* 71* 75* 68*   < > = values in this interval not displayed.    Basic Metabolic Panel: Recent Labs  Lab  02/06/20 0214 02/06/20 1009 02/06/20 1543 02/07/20 0048 02/07/20 1442 02/08/20 0306 02/09/20 0306  NA 139   < > 135 136 136 137 137  K 3.9   < > 3.8 3.7 4.2 3.9 4.2  CL 108   < > 104 107 108 107 108  CO2 25   < > 23 22 22  21* 23  GLUCOSE 144*   < > 120* 124* 142* 128* 113*  BUN 14   < > 13 16 12 10 15   CREATININE 0.90   < > 0.73 0.73 0.64 0.63 0.62  CALCIUM 7.8*   < > 8.0* 8.0* 7.7* 7.9* 8.2*  MG 2.6*  --  2.3 2.2 2.3 2.0 1.8  PHOS 2.3*  --   --   --   --   --   --    < > = values in this interval not displayed.   GFR: Estimated Creatinine Clearance: 83.5 mL/min (by C-G formula based on SCr of 0.62 mg/dL). Recent Labs  Lab 02/05/20 1745 02/05/20 1745 02/05/20 1937 02/05/20 2225 02/06/20 0110 02/06/20 0214 02/07/20 0048 02/07/20 1442 02/08/20 0306 02/08/20 0558 02/09/20 0306  PROCALCITON  --   --   --   --   --   --   --  0.10 <0.10  --   --   WBC 8.1   < >  --  8.7  --    < > 9.7  --  7.4 8.4 6.6  LATICACIDVEN 3.8*  --  2.4* 1.3 1.9  --   --   --   --   --   --    < > = values in this interval not displayed.    Liver Function Tests: Recent Labs  Lab 02/05/20 2225 02/07/20 1442  AST 898* 119*  ALT 482* 210*  ALKPHOS 75 67  BILITOT 0.8 0.6  PROT 5.2* 4.9*  ALBUMIN 3.1* 2.8*   No results for input(s): LIPASE, AMYLASE in the last 168 hours. No results for input(s): AMMONIA in the last 168 hours.  ABG    Component Value Date/Time   PHART 7.298 (L) 02/05/2020 1853   PCO2ART 52.4 (H) 02/05/2020 1853   PO2ART 255 (H) 02/05/2020 1853   HCO3 23.9 02/06/2020 1009   HCO3 23.7 02/06/2020 1009   TCO2 25 02/06/2020 1009   TCO2 25 02/06/2020 1009   ACIDBASEDEF 1.0 02/05/2020 1853   O2SAT 86.1 02/09/2020 0246     Coagulation Profile: Recent Labs  Lab 02/05/20 1745  INR 1.0    Cardiac Enzymes: No results for input(s): CKTOTAL, CKMB, CKMBINDEX, TROPONINI in the last 168 hours.  HbA1C: Hgb A1c MFr Bld  Date/Time Value Ref Range Status  02/06/2020 12:41  PM 5.3 4.8 -  5.6 % Final    Comment:    (NOTE)         Prediabetes: 5.7 - 6.4         Diabetes: >6.4         Glycemic control for adults with diabetes: <7.0     CBG: Recent Labs  Lab 02/08/20 1112 02/08/20 1554 02/08/20 2019 02/09/20 0006 02/09/20 0354  GLUCAP 71 77 89 94 70     CRITICAL CARE Performed by: Lanier Clam   Total critical care time: 48  minutes  Critical care time was exclusive of separately billable procedures and treating other patients.  Critical care was necessary to treat or prevent imminent or life-threatening deterioration.  Critical care was time spent personally by me on the following activities: development of treatment plan with patient and/or surrogate as well as nursing, discussions with consultants, evaluation of patient's response to treatment, examination of patient, obtaining history from patient or surrogate, ordering and performing treatments and interventions, ordering and review of laboratory studies, ordering and review of radiographic studies, pulse oximetry and re-evaluation of patient's condition.  Tessie Fass MSN, AGACNP-BC [facility] [phone] If no answer, [phone] 02/09/2020, 7:46 AM   PCCM attending:  This is a 54 year old witnessed cardiac arrest at home, V. fib, torsades.  Taken to the Cath Lab with clean coronaries concern for stress-induced cardiomyopathy.  Patient ultimately developed cardiogenic shock requiring intra-aortic balloon pump which was weaned yesterday.  Had fevers which was started on antibiotics cultures negative.  MRSA PCR negative.  This morning awake on mechanical support sedated with propofol.  Tolerating SBT.  Discussed with respiratory therapy with plans for liberation from mechanical support.  BP (!) 116/51   Pulse 88   Temp 98.6 F (37 C)   Resp 17   Ht 5\' 5"  (1.651 m)   Wt 73.4 kg   LMP  (LMP Unknown)   SpO2 97%   BMI 26.93 kg/m   General: Middle-aged  female intubated on mechanical life support Heart: Regular rhythm, S1-S2 Lungs: Bilateral mechanically ventilated breath sounds Abdomen: No soft nontender nondistended Extremities: Warm dry, no cyanosis.  Labs: Reviewed, creatinine stable. Chest x-ray: Right upper lobe airspace opacity, a clearing.  Assessment: Cardiogenic shock secondary to stress-induced cardiomyopathy, resolving Status post intra-aortic balloon pump and vasopressors now weaned off. Acute hypoxemic respiratory failure secondary to above, likely evidence of aspiration with infiltrate in right upper lobe during arrest state. Baseline mood disorder on multiple home PTA medications  Plan: Goal-directed heart failure regimen per heart failure services. Wean from mechanical ventilator today Sedation held Vancomycin stopped, negative MRSA PCR Continue cefepime Restart home mood stabilizers Lidocaine infusion per EP/cardiology Heparin stopped as no longer on balloon pump.  This patient is critically ill with multiple organ system failure; which, requires frequent high complexity decision making, assessment, support, evaluation, and titration of therapies. This was completed through the application of advanced monitoring technologies and extensive interpretation of multiple databases. During this encounter critical care time was devoted to patient care services described in this note for 32 minutes.  , DO [facility] 02/09/2020 11:24 AM

## 2020-02-09 NOTE — Progress Notes (Addendum)
Advanced Heart Failure Rounding Note  PCP-Cardiologist: No primary care provider on file.   Subjective:    10/25 S/P IABP.  Temp pacer removed. 10/27 IABP removed.   On Lidocaine 2 mcg. Lidocaine level pending.   Awake on vent.   CO-OX 86%  PAP: (17-34)/(7-20) 23/13 CVP:  [1 mmHg-11 mmHg] 9 mmHg PCWP:  [11 mmHg-16 mmHg] 16 mmHg CO:  [4.9 L/min-6.6 L/min] 5.6 L/min CI:  [2.7 L/min/m2-3.6 L/min/m2] 3.1 L/min/m2   Objective:   Weight Range: 73.4 kg Body mass index is 26.93 kg/m.   Vital Signs:   Temp:  [99.3 F (37.4 C)-100.4 F (38 C)] 99.7 F (37.6 C) (10/28 0756) Pulse Rate:  [28-75] 66 (10/28 0700) Resp:  [18-24] 22 (10/28 0756) BP: (82-144)/(51-90) 90/52 (10/28 0700) SpO2:  [92 %-100 %] 100 % (10/28 0739) Arterial Line BP: (94-137)/(49-68) 124/68 (10/27 1400) FiO2 (%):  [40 %] 40 % (10/28 0739) Weight:  [73.4 kg] 73.4 kg (10/28 0400) Last BM Date:  (Unknown, PTA)  Weight change: Filed Weights   02/07/20 0500 02/08/20 0419 02/09/20 0400  Weight: 74.3 kg 75 kg 73.4 kg    Intake/Output:   Intake/Output Summary (Last 24 hours) at 02/09/2020 0816 Last data filed at 02/09/2020 0752 Gross per 24 hour  Intake 3453.58 ml  Output 3050 ml  Net 403.58 ml      Physical Exam   General:  Intubated.  HEENT: ETT  Neck: supple. no JVD. Carotids 2+ bilat; no bruits. No lymphadenopathy or thryomegaly appreciated. RIJ Cor: PMI nondisplaced. Regular rate & rhythm. No rubs, gallops or murmurs. Lungs: Coarse throughout  Abdomen: soft, nontender, nondistended. No hepatosplenomegaly. No bruits or masses. Good bowel sounds. Extremities: no cyanosis, clubbing, rash, edema. MAE x4. R groin soft.  Neuro: Intubated   Telemetry  SR with rare PVCs 70s   EKG    N/a   Labs    CBC Recent Labs    02/08/20 0558 02/09/20 0306  WBC 8.4 6.6  HGB 9.9* 9.2*  HCT 31.2* 29.0*  MCV 97.2 97.0  PLT 75* 68*   Basic Metabolic Panel Recent Labs    02/08/20 0306  02/09/20 0306  NA 137 137  K 3.9 4.2  CL 107 108  CO2 21* 23  GLUCOSE 128* 113*  BUN 10 15  CREATININE 0.63 0.62  CALCIUM 7.9* 8.2*  MG 2.0 1.8   Liver Function Tests Recent Labs    02/07/20 1442  AST 119*  ALT 210*  ALKPHOS 67  BILITOT 0.6  PROT 4.9*  ALBUMIN 2.8*   No results for input(s): LIPASE, AMYLASE in the last 72 hours. Cardiac Enzymes No results for input(s): CKTOTAL, CKMB, CKMBINDEX, TROPONINI in the last 72 hours.  BNP: BNP (last 3 results) No results for input(s): BNP in the last 8760 hours.  ProBNP (last 3 results) No results for input(s): PROBNP in the last 8760 hours.   D-Dimer No results for input(s): DDIMER in the last 72 hours. Hemoglobin A1C Recent Labs    02/06/20 1241  HGBA1C 5.3   Fasting Lipid Panel Recent Labs    02/08/20 0306  TRIG 85   Thyroid Function Tests No results for input(s): TSH, T4TOTAL, T3FREE, THYROIDAB in the last 72 hours.  Invalid input(s): FREET3  Other results:   Imaging    No results found.   Medications:     Scheduled Medications: . buPROPion  100 mg Per Tube TID  . carvedilol  3.125 mg Per Tube BID WC  .  chlorhexidine gluconate (MEDLINE KIT)  15 mL Mouth Rinse BID  . Chlorhexidine Gluconate Cloth  6 each Topical Daily  . digoxin  0.125 mg Per Tube Daily  . docusate  100 mg Per Tube BID  . enoxaparin (LOVENOX) injection  40 mg Subcutaneous Q24H  . famotidine  20 mg Per Tube BID  . feeding supplement (PROSource TF)  45 mL Per Tube QID  . lamoTRIgine  150 mg Per Tube BID  . mouth rinse  15 mL Mouth Rinse 10 times per day  . polyethylene glycol  17 g Per Tube Daily  . sodium chloride flush  10-40 mL Intracatheter Q12H  . sodium chloride flush  3 mL Intravenous Q12H  . spironolactone  25 mg Per Tube Daily    Infusions: . sodium chloride    . sodium chloride Stopped (02/08/20 1430)  . ceFEPime (MAXIPIME) IV Stopped (02/09/20 0041)  . feeding supplement (VITAL AF 1.2 CAL) 1,000 mL (02/08/20  2155)  . lidocaine 2 mg/min (02/09/20 0600)  . magnesium sulfate bolus IVPB 2 g (02/09/20 0746)  . norepinephrine (LEVOPHED) Adult infusion Stopped (02/08/20 0817)  . propofol (DIPRIVAN) infusion 80 mcg/kg/min (02/09/20 0600)  . vancomycin Stopped (02/08/20 2106)    PRN Medications: sodium chloride, acetaminophen, clonazepam, fentaNYL (SUBLIMAZE) injection, influenza vac split quadrivalent PF, midazolam, polyethylene glycol, sodium chloride flush, sodium chloride flush    Patient Profile  Kristina Nichols is a 54 year old with h/o witnessed cardiac arrest at homewith refractory vib/ torsades. Intubated in ER. Taken for [facility] with clean coronaries, findings consistent with stress induced cardiomyopathy, LVEF <25%.   Cardiogenic shock/torsades. Multiple shocks.   Assessment/Plan  1. V Fib /V tachArrest- refractory torsades -Cath last night with normal coronaries. Temp wire removed 10/25.  -Multiple shocks. Last shock 02/06/20.  -EP consulted. Attempted to RV pace but went back in torsades.  - Was on amio drip initially but stopped due to prolonged Qtc.  - Continue lidocaine drip 2 mg per hour. Lidocaine level 5 >pending.  - Per EP -Should she have increased ectopy with extubation could consider salvage ablation.  - Later would consider ICD prior to d/c once she has had significant improvement.  - K stable.  - Give 2 grams Mag today.    2. Cardiogenic Shock /Acute Systolic HF -Cath no coronary disease, LV gram concerning for Takotsbo, EF 20%. - ECHO 10/25 RV ok, LV 20%.   - Elevated LFTs in setting of shock. - IABP out 10/27. Hemodynamic stable.  - Continue coreg 3.125 mg twice a day.  - Continue spiro to 25 mg daily.   - Renal function stable.    3. Acute Respiratory Failure -Vent per CCM with plans to extubate later today.   4. Depression On wellbutrin/klonipin prior to admit.   5. Elevated LFTs Suspect in the setting of shock  6. ID Blood Cultures obtained 10/26-->  NGTD Febrile T 100.4  Adding vanc and cefepime.  Pro calcitonin < 0.1 WBC 6.6 today.  7. Anemia  -Hgb trending down. 13.3>12>10.3->9.2   - No obvious source.  8. Thrombocytopenia  -Platelets trending down to 143>71 >68 -Off heparin - HIT pending    Length of Stay: 4  Amy Clegg, NP  02/09/2020, 8:16 AM  Advanced Heart Failure Team Pager [phone] (M-F; 7a - 4p)  Please contact [facility] for night-coverage after hours (4p -7a ) and weekends on [email or web address]  Patient seen with NP, agree with the above note.   Awake on vent today, IABP  out. Good cardiac output, co-ox ?86%, CVP 10.  Still with low grade fever 100.4.   General: Awake on vent Neck: JVP 8-9 cm, no thyromegaly or thyroid nodule.  Lungs: Mildly decreased at bases.  CV: Nondisplaced PMI.  Heart regular S1/S2, no S3/S4, no murmur.  No peripheral edema.   Abdomen: Soft, nontender, no hepatosplenomegaly, no distention.  Skin: Intact without lesions or rashes.  Neurologic: Alert, writing on whiteboard Extremities: No clubbing or cyanosis.  HEENT: Normal.   Hemodynamically stable with no further VT/NSVT.  IABP out.  Cardiac output good, CVP about 10 with PCWP 16.  - Will give 1 dose of Lasix 20 mg IV with KCl and Mg, hopefully extubate today.   - Continue Coreg 3.125 mg bid, digoxin 0.125 daily, and spironolactone 25 daily.  - Add losartan 12.5 mg daily.  If BP remains stable, transition to [place].   With fever and RUL opacification, started empiric vancomycin/cefepime.  Cultures sent and pending. Still with low grade fever.    Lidocaine continues, level ok at 4.3.  Will eventually need transition to po, ?mexiletine.  Suspect Takotsubo/stress cardiomyopathy, will need to decide on ICD versus Lifevest prior to discharge.  Cardiac MRI when she is extubated and stable may be helpful, if she has significant scar/evidence for infiltrative disease or myocarditis, ICD would likely be the best route.   Platelets still  low at 68K but plateaued, may be due to IABP but have sent HIT.  No heparin for now.  SCDs.   CRITICAL CARE Performed by: Loralie Champagne  Total critical care time: 35 minutes  Critical care time was exclusive of separately billable procedures and treating other patients.  Critical care was necessary to treat or prevent imminent or life-threatening deterioration.  Critical care was time spent personally by me on the following activities: development of treatment plan with patient and/or surrogate as well as nursing, discussions with consultants, evaluation of patient's response to treatment, examination of patient, obtaining history from patient or surrogate, ordering and performing treatments and interventions, ordering and review of laboratory studies, ordering and review of radiographic studies, pulse oximetry and re-evaluation of patient's condition.  Loralie Champagne 02/09/2020 8:57 AM

## 2020-02-09 NOTE — Procedures (Signed)
Extubation Procedure Note  Patient Details:   Name: Kristina Nichols DOB: 1965/11/12 MRN: [id number]   Airway Documentation:    Vent end date: 02/09/20 Vent end time: 1010   Evaluation  O2 sats: stable throughout Complications: No apparent complications Patient did tolerate procedure well. Bilateral Breath Sounds: Rhonchi, Diminished   Yes, pt could speak post extubation.  Pt extubated to 4 l/m [place] without difficulty.  Family at bedside for extubation.  Audrie Lia 02/09/2020, 10:12 AM

## 2020-02-09 NOTE — Progress Notes (Addendum)
Progress Note  Patient Name: Kristina Nichols Date of Encounter: 02/09/2020  [facility] Cardiologist: new to [facility]  Subjective   Intubated and sedated, opens eyes to verbal  Inpatient Medications    Scheduled Meds: . buPROPion  100 mg Per Tube TID  . carvedilol  3.125 mg Per Tube BID WC  . chlorhexidine gluconate (MEDLINE KIT)  15 mL Mouth Rinse BID  . Chlorhexidine Gluconate Cloth  6 each Topical Daily  . digoxin  0.125 mg Per Tube Daily  . docusate  100 mg Per Tube BID  . enoxaparin (LOVENOX) injection  40 mg Subcutaneous Q24H  . famotidine  20 mg Per Tube BID  . feeding supplement (PROSource TF)  45 mL Per Tube QID  . lamoTRIgine  150 mg Per Tube BID  . mouth rinse  15 mL Mouth Rinse 10 times per day  . polyethylene glycol  17 g Per Tube Daily  . sodium chloride flush  10-40 mL Intracatheter Q12H  . sodium chloride flush  3 mL Intravenous Q12H  . spironolactone  25 mg Per Tube Daily   Continuous Infusions: . sodium chloride    . sodium chloride Stopped (02/08/20 1430)  . ceFEPime (MAXIPIME) IV Stopped (02/09/20 0041)  . feeding supplement (VITAL AF 1.2 CAL) 1,000 mL (02/08/20 2155)  . lidocaine 2 mg/min (02/09/20 0600)  . norepinephrine (LEVOPHED) Adult infusion Stopped (02/08/20 0817)  . propofol (DIPRIVAN) infusion 80 mcg/kg/min (02/09/20 0600)  . vancomycin Stopped (02/08/20 2106)   PRN Meds: sodium chloride, acetaminophen, clonazepam, fentaNYL (SUBLIMAZE) injection, influenza vac split quadrivalent PF, midazolam, polyethylene glycol, sodium chloride flush, sodium chloride flush   Vital Signs    Vitals:   02/09/20 0400 02/09/20 0500 02/09/20 0600 02/09/20 0700  BP: 109/70 116/69 (!) 115/58 (!) 90/52  Pulse: 75 72  66  Resp: 20 (!) 22 19 (!) 22  Temp: 100.2 F (37.9 C) (!) 100.4 F (38 C) (!) 100.4 F (38 C) 99.7 F (37.6 C)  TempSrc: Core     SpO2: 100% 100% 100% 100%  Weight: 73.4 kg     Height:        Intake/Output Summary (Last 24 hours) at  02/09/2020 0923 Last data filed at 02/09/2020 0600 Gross per 24 hour  Intake 3617.52 ml  Output 2850 ml  Net 767.52 ml   Last 3 Weights 02/09/2020 02/08/2020 02/07/2020  Weight (lbs) 161 lb 13.1 oz 165 lb 5.5 oz 163 lb 12.8 oz  Weight (kg) 73.4 kg 75 kg 74.3 kg      Telemetry    SR 80's generally, occ PVCs, last VT (TdP) was nonsustained episode about 1330 on 10/25 - Personally Reviewed  ECG    SR 80bpm, diffuse T changes, PVCs, QTc 449 - Personally Reviewed  Physical Exam   GEN: intubated and sedated but wakes to verbal Neck: no JVP Cardiac: RRR, no murmurs, rubs, or gallops.  Respiratory: clear/intubated GI: Soft, nontender, non-distended  MS: No edema; No deformity. Neuro:  unable to assess Psych: unable to assess  Feet are warm  Labs    High Sensitivity Troponin:   Recent Labs  Lab 02/05/20 1745 02/05/20 1929 02/05/20 2107 02/05/20 2225  TROPONINIHS 33* 88* 119* 232*      Chemistry Recent Labs  Lab 02/05/20 2225 02/06/20 0214 02/07/20 1442 02/08/20 0306 02/09/20 0306  NA 137   < > 136 137 137  K 4.9   < > 4.2 3.9 4.2  CL 109   < > 108 107  108  CO2 22   < > 22 21* 23  GLUCOSE 199*   < > 142* 128* 113*  BUN 16   < > 12 10 15   CREATININE 0.94   < > 0.64 0.63 0.62  CALCIUM 7.0*   < > 7.7* 7.9* 8.2*  PROT 5.2*  --  4.9*  --   --   ALBUMIN 3.1*  --  2.8*  --   --   AST 898*  --  119*  --   --   ALT 482*  --  210*  --   --   ALKPHOS 75  --  67  --   --   BILITOT 0.8  --  0.6  --   --   GFRNONAA >60   < > >60 >60 >60  ANIONGAP 6   < > 6 9 6    < > = values in this interval not displayed.     Hematology Recent Labs  Lab 02/08/20 0306 02/08/20 0558 02/09/20 0306  WBC 7.4 8.4 6.6  RBC 3.31* 3.21* 2.99*  HGB 10.3* 9.9* 9.2*  HCT 32.4* 31.2* 29.0*  MCV 97.9 97.2 97.0  MCH 31.1 30.8 30.8  MCHC 31.8 31.7 31.7  RDW 13.9 13.9 14.1  PLT 71* 75* 68*    BNPNo results for input(s): BNP, PROBNP in the last 168 hours.   DDimer  Recent Labs  Lab  02/05/20 1937  DDIMER >20.00*     Radiology    CT Head Wo Contrast  Result Date: 02/05/2020 CLINICAL DATA:  54 year old female with altered mental status post cardiac arrest. EXAM: CT HEAD WITHOUT CONTRAST TECHNIQUE: Contiguous axial images were obtained from the base of the skull through the vertex without intravenous contrast. COMPARISON:  None. FINDINGS: Brain: No evidence of acute infarction, hemorrhage, hydrocephalus, extra-axial collection or mass lesion/mass effect. Vascular: No hyperdense vessel or unexpected calcification. Skull: Normal. Negative for fracture or focal lesion. Sinuses/Orbits: No acute finding. Other: Oral intubation noted. IMPRESSION: No evidence of acute intracranial abnormality. Electronically Signed   By: Margarette Canada M.D.   On: 02/05/2020 18:48   DG Chest Port 1 View Result Date: 02/05/2020 CLINICAL DATA:  Status post CPR. EXAM: PORTABLE CHEST 1 VIEW COMPARISON:  February 05, 2020 (6:03 p.m.) FINDINGS: There is stable endotracheal tube and nasogastric tube positioning. Mild diffusely increased interstitial lung markings are seen with moderate to marked severity patchy left suprahilar and right upper lobe infiltrates. Mild bilateral lower lobe infiltrates are also seen. These areas are mildly increased in severity when compared to the prior study. The heart size and mediastinal contours are within normal limits. The visualized skeletal structures are unremarkable. IMPRESSION: Moderate to marked severity bilateral patchy infiltrates, as described above, with mild interval increase in severity when compared to the prior exam. Electronically Signed   By: Virgina Norfolk M.D.   On: 02/05/2020 20:25   DG Chest Portable 1 View  Result Date: 02/05/2020 CLINICAL DATA:  Post intubation, witnessed cardiac arrest and CPR EXAM: PORTABLE CHEST 1 VIEW COMPARISON:  None FINDINGS: Endotracheal tube terminates between the clavicles approximately 4 cm above the carina. Gastric tube  courses through in a off the field of the radiograph, side port in the region of the mid stomach, tip off the field of view. Pacer defibrillator pads project over the patient. Upper lobe opacities are present with some elevation of the minor fissure in the RIGHT chest. Less opacity at the LEFT lung apex. On limited assessment no acute skeletal  process. IMPRESSION: 1. Endotracheal tube terminates between the clavicles approximately 4 cm above the carina. 2. Suggested volume loss and consolidative changes in the RIGHT upper lobe greater than LEFT upper lobe. Findings could be related to volume loss with pneumonitis from aspiration and potential background of asymmetric pulmonary edema or pneumonia. Underlying mass would be difficult to exclude. Follow-up after resolution of acute symptoms is suggested to exclude underlying lesion. 3. Gastric tube courses through a off the field of the radiograph, side port in the region of the mid stomach, tip off the field of view. Electronically Signed   By: Zetta Bills M.D.   On: 02/05/2020 18:20    Cardiac Studies    02/06/2020: TTE IMPRESSIONS  1. No LV thrombus visualized, thought cannot excluded given LV substrate.  Left ventricular ejection fraction, by estimation, is <20%. The left  ventricle has severely decreased function. The left ventricle demonstrates  global hypokinesis. Left  ventricular diastolic parameters are consistent with Grade I diastolic  dysfunction (impaired relaxation).  2. Right ventricular systolic function is normal. The right ventricular  size is normal.  3. The mitral valve is grossly normal. No evidence of mitral valve  regurgitation.  4. The aortic valve was not well visualized. Aortic valve regurgitation  is trivial.  5. Aortic IABP in Descending Aorta.  6. The inferior vena cava is dilated in size with <50% respiratory  variability, suggesting right atrial pressure of 15 mmHg.   Comparison(s): No prior Echocardiogram.     02/06/2020: RHC, IABP insertion 1. Mildly elevated left and right heart filling pressures.  2. Low cardiac output by thermodilution on norepinephrine 4, preserved output by Fick.  3. Successful IABP placement.  4. Swan left in place right IJ.     02/06/2020: LHC  There is severe left ventricular systolic dysfunction.  LV end diastolic pressure is moderately elevated.  The left ventricular ejection fraction is less than 25% by visual estimate.  There is no mitral valve regurgitation.  1. No angiographic evidence of CAD 2. Severe segmental LV systolic dysfunction with wall motion suggesting Takotsubo's cardiomyopathy (stress induced cardiomyopathy).  3. Elevated LVEDP 4. Ventricular fibrillation/Torsades-Temporary Pacemaker placed per request of admitting/consulting team in case of need of overdrive pacing tonight  Recommendations: No further ischemic workup. Management of VF/Torsades per EP team in the am. Medical management of likely stress induced cardiomyopathy.     Patient Profile     54 y.o. female with a hx of depression/anxiety, hypothyroidism (off tx), GERD, chronic granulomatosis carrier (?), no noted past cardiac history admitted with VF arrest  She has had recurrent TdP requiring multiple defibrillations  Assessment & Plan    1. Torsades 2. Cardiogenic shock 3. Respiratory failure      Her last defibrillation was Monday AM 0830 (while we were at bedside)      On nonsustained episode approx 1330 Monday      intermittently she has increased frequency of PVCs, these continue      Good urine OP  No CAD by LHC LVEF <20%, suspect Takotsbo Reduced CO by RHC   Remains on lidocaine gtt @ 2 IABP out Off pressor   Coox this AM 86.1 FEBRILE, Tmax yest morning 101.5, today better 100.4 Abnormal CXR this yesterday > deferred to AHF and CCM On ABx, BC neg so far  K+ 4.2  Mag 1.8 >> ordered 2g LIDO LEVEL  Yesterday 4.5, today is pending PLTS 68  today H/H trending down >> but stable from yesterday, HIT  labs pending IABP out and hep gtt off  Off levophed  Wakes to verbal Husband at bedside   ? Plans to wean vent/extubate Continue lidocaine through this process (pending level today), will use amio if needed Dr. Quentin Ore will see later today   For questions or updates, please contact [facility] Please consult [email or web address] for contact info under        Signed, Baldwin [place], PA-C  02/09/2020, 7:22 AM

## 2020-02-10 DIAGNOSIS — I4901 Ventricular fibrillation: Secondary | ICD-10-CM

## 2020-02-10 DIAGNOSIS — D649 Anemia, unspecified: Secondary | ICD-10-CM

## 2020-02-10 DIAGNOSIS — D696 Thrombocytopenia, unspecified: Secondary | ICD-10-CM

## 2020-02-10 DIAGNOSIS — R1310 Dysphagia, unspecified: Secondary | ICD-10-CM

## 2020-02-10 LAB — HEPATIC FUNCTION PANEL
ALT: 104 U/L — ABNORMAL HIGH (ref 0–44)
AST: 46 U/L — ABNORMAL HIGH (ref 15–41)
Albumin: 2.8 g/dL — ABNORMAL LOW (ref 3.5–5.0)
Alkaline Phosphatase: 134 U/L — ABNORMAL HIGH (ref 38–126)
Bilirubin, Direct: 0.2 mg/dL (ref 0.0–0.2)
Indirect Bilirubin: 0.5 mg/dL (ref 0.3–0.9)
Total Bilirubin: 0.7 mg/dL (ref 0.3–1.2)
Total Protein: 5.9 g/dL — ABNORMAL LOW (ref 6.5–8.1)

## 2020-02-10 LAB — CBC
HCT: 30.9 % — ABNORMAL LOW (ref 36.0–46.0)
Hemoglobin: 9.9 g/dL — ABNORMAL LOW (ref 12.0–15.0)
MCH: 30.9 pg (ref 26.0–34.0)
MCHC: 32 g/dL (ref 30.0–36.0)
MCV: 96.6 fL (ref 80.0–100.0)
Platelets: 101 10*3/uL — ABNORMAL LOW (ref 150–400)
RBC: 3.2 MIL/uL — ABNORMAL LOW (ref 3.87–5.11)
RDW: 13.5 % (ref 11.5–15.5)
WBC: 7.3 10*3/uL (ref 4.0–10.5)
nRBC: 0 % (ref 0.0–0.2)

## 2020-02-10 LAB — BASIC METABOLIC PANEL
Anion gap: 8 (ref 5–15)
BUN: 12 mg/dL (ref 6–20)
CO2: 28 mmol/L (ref 22–32)
Calcium: 8.7 mg/dL — ABNORMAL LOW (ref 8.9–10.3)
Chloride: 101 mmol/L (ref 98–111)
Creatinine, Ser: 0.49 mg/dL (ref 0.44–1.00)
GFR, Estimated: >60 mL/min (ref >60)
Glucose, Bld: 132 mg/dL — ABNORMAL HIGH (ref 70–99)
Potassium: 4.4 mmol/L (ref 3.5–5.1)
Sodium: 137 mmol/L (ref 135–145)

## 2020-02-10 LAB — MAGNESIUM: Magnesium: 2 mg/dL (ref 1.7–2.4)

## 2020-02-10 LAB — COOXEMETRY PANEL
Carboxyhemoglobin: 0.8 % (ref 0.5–1.5)
Methemoglobin: 1.3 % (ref 0.0–1.5)
O2 Saturation: 81.2 %
Total hemoglobin: 10.2 g/dL — ABNORMAL LOW (ref 12.0–16.0)

## 2020-02-10 LAB — GLUCOSE, CAPILLARY
Glucose-Capillary: 118 mg/dL — ABNORMAL HIGH (ref 70–99)
Glucose-Capillary: 89 mg/dL (ref 70–99)
Glucose-Capillary: 109 mg/dL — ABNORMAL HIGH (ref 70–99)
Glucose-Capillary: 135 mg/dL — ABNORMAL HIGH (ref 70–99)
Glucose-Capillary: 142 mg/dL — ABNORMAL HIGH (ref 70–99)

## 2020-02-10 LAB — LIDOCAINE LEVEL: Lidocaine Lvl: 4.4 ug/mL (ref 1.5–5.0)

## 2020-02-10 MED ORDER — MEXILETINE HCL 150 MG PO CAPS
150.0000 mg | ORAL_CAPSULE | Freq: Three times a day (TID) | ORAL | Status: DC
  Administered 2020-02-10 – 2020-02-15 (×14): 150 mg via ORAL
  Filled 2020-02-10 (×17): qty 1

## 2020-02-10 MED ORDER — LOSARTAN POTASSIUM 25 MG PO TABS
12.5000 mg | ORAL_TABLET | Freq: Two times a day (BID) | ORAL | Status: DC
  Administered 2020-02-10 – 2020-02-12 (×5): 12.5 mg via ORAL
  Filled 2020-02-10 (×5): qty 1

## 2020-02-10 MED ORDER — ENOXAPARIN SODIUM 40 MG/0.4ML ~~LOC~~ SOLN
40.0000 mg | SUBCUTANEOUS | Status: DC
  Administered 2020-02-10 – 2020-02-13 (×4): 40 mg via SUBCUTANEOUS
  Filled 2020-02-10 (×5): qty 0.4

## 2020-02-10 MED ORDER — MEXILETINE HCL 150 MG PO CAPS
150.0000 mg | ORAL_CAPSULE | Freq: Three times a day (TID) | ORAL | Status: DC
  Filled 2020-02-10 (×2): qty 1

## 2020-02-10 MED ORDER — LIDOCAINE IN D5W 4-5 MG/ML-% IV SOLN: 2.0000 mg/min | INTRAVENOUS | Status: DC

## 2020-02-10 MED ORDER — CHLORHEXIDINE GLUCONATE CLOTH 2 % EX PADS
6.0000 | MEDICATED_PAD | Freq: Every day | CUTANEOUS | Status: DC
  Administered 2020-02-11 – 2020-02-13 (×3): 6 via TOPICAL

## 2020-02-10 MED ORDER — ACETAMINOPHEN 10 MG/ML IV SOLN
1000.0000 mg | Freq: Four times a day (QID) | INTRAVENOUS | Status: DC | PRN
  Administered 2020-02-10: 1000 mg via INTRAVENOUS
  Filled 2020-02-10: qty 100

## 2020-02-10 NOTE — Evaluation (Signed)
Physical Therapy Evaluation Patient Details Name: Kristina Nichols MRN: [id number] DOB: 06-11-1965 Today's Date: 02/10/2020   History of Present Illness  Pt is a 54 year old woman admitted on 02/05/20 with stress induced cardiomyopathy resulting in recurrent v-fib and torsades arrests. Intubated in ED, extubated 02/09/20. PMH: anxiety, depression, hypothyroidism, HLD, gastroparesis.  Clinical Impression  Pt admitted with above diagnosis.  Pt was independent prior to admission. Presents with anxiety, impaired cognition, generalized weakness and impaired standing balance. Pt requires +2 min assist for OOB, limited to standing and stepping along EOB due to Swan-ganz in place and up to moderate assistance for ADL. Pt has a supportive husband who plans to provide 24 hour care upon discharge. Pt currently with functional limitations due to the deficits listed below (see PT Problem List). Pt will benefit from skilled PT to increase their independence and safety with mobility to allow discharge to the venue listed below.      Follow Up Recommendations Home health PT;Supervision/Assistance - 24 hour    Equipment Recommendations  Rolling walker with 5" wheels;3in1 (PT)    Recommendations for Other Services       Precautions / Restrictions Precautions Precautions: Fall Precaution Comments: Theone Murdoch in place Restrictions Weight Bearing Restrictions: No      Mobility  Bed Mobility Overal bed mobility: Needs Assistance Bed Mobility: Supine to Sit;Sit to Supine     Supine to sit: Min assist Sit to supine: Min assist   General bed mobility comments: assist for hips to EOB using bed pad and for LEs back into bed    Transfers Overall transfer level: Needs assistance Equipment used: 2 person hand held assist Transfers: Sit to/from Stand Sit to Stand: +2 physical assistance;Min assist         General transfer comment: increased time, assist to rise and steady.  Took a few side steps to  [facility].   Ambulation/Gait                Stairs            Wheelchair Mobility    Modified Rankin (Stroke Patients Only)       Balance Overall balance assessment: Needs assistance Sitting-balance support: No upper extremity supported;Feet supported Sitting balance-Leahy Scale: Fair     Standing balance support: Bilateral upper extremity supported;During functional activity Standing balance-Leahy Scale: Poor Standing balance comment: Pt had to hold onto PTs hands for stability with static standing                             Pertinent Vitals/Pain Pain Assessment: Faces Faces Pain Scale: Hurts even more Pain Location: chest from compressions Pain Descriptors / Indicators: Sore;Grimacing;Discomfort Pain Intervention(s): Limited activity within patient's tolerance;Monitored during session;Repositioned    Home Living Family/patient expects to be discharged to:: Private residence Living Arrangements: Spouse/significant other;Children (25 year old son is on CIR) Available Help at Discharge: Family;Available 24 hours/day (husband to take leave) Type of Home: House Home Access: Stairs to enter   [employer] of Steps: 1 Home Layout: Two level;Bed/bath upstairs;1/2 bath on main level Home Equipment: None      Prior Function Level of Independence: Independent   Gait / Transfers Assistance Needed: I with gait  ADL's / Homemaking Assistance Needed: shares cooking with husband, drives  Comments: Pt likes to read.      Hand Dominance   Dominant Hand: Right    Extremity/Trunk Assessment   Upper Extremity Assessment Upper  Extremity Assessment: Defer to OT evaluation    Lower Extremity Assessment Lower Extremity Assessment: Generalized weakness    Cervical / Trunk Assessment Cervical / Trunk Assessment: Normal  Communication   Communication: Expressive difficulties (low volume)  Cognition Arousal/Alertness: Awake/alert Behavior  During Therapy: Anxious Overall Cognitive Status: Impaired/Different from baseline Area of Impairment: Orientation;Attention;Memory;Following commands;Awareness;Problem solving                 Orientation Level: Disoriented to;Situation;Time;Place Current Attention Level: Focused Memory: Decreased short-term memory Following Commands: Follows one step commands with increased time;Follows one step commands inconsistently (and multimodal cues)     Problem Solving: Slow processing;Decreased initiation;Difficulty sequencing;Requires verbal cues;Requires tactile cues General Comments: verbose, tangential      General Comments General comments (skin integrity, edema, etc.): 78 bpm, 96% 4 LO2, 18, 116/66    Exercises     Assessment/Plan    PT Assessment Patient needs continued PT services  PT Problem List Decreased activity tolerance;Decreased balance;Decreased mobility;Decreased knowledge of use of DME;Decreased safety awareness;Decreased knowledge of precautions;Cardiopulmonary status limiting activity;Pain       PT Treatment Interventions DME instruction;Gait training;Functional mobility training;Therapeutic activities;Therapeutic exercise;Balance training;Patient/family education;Stair training    PT Goals (Current goals can be found in the Care Plan section)  Acute Rehab PT Goals Patient Stated Goal: return home PT Goal Formulation: With patient Time For Goal Achievement: 02/24/20 Potential to Achieve Goals: Good    Frequency Min 3X/week   Barriers to discharge Decreased caregiver support (husband may be able to take off, son is on Rehab)      Co-evaluation PT/OT/SLP Co-Evaluation/Treatment: Yes Reason for Co-Treatment: Complexity of the patient's impairments (multi-system involvement);For patient/therapist safety PT goals addressed during session: Mobility/safety with mobility OT goals addressed during session: ADL's and self-care       AM-PAC PT "6 Clicks"  Mobility  Outcome Measure Help needed turning from your back to your side while in a flat bed without using bedrails?: A Little Help needed moving from lying on your back to sitting on the side of a flat bed without using bedrails?: A Little Help needed moving to and from a bed to a chair (including a wheelchair)?: A Little Help needed standing up from a chair using your arms (e.g., wheelchair or bedside chair)?: A Little Help needed to walk in hospital room?: Total Help needed climbing 3-5 steps with a railing? : Total 6 Click Score: 14    End of Session   Activity Tolerance: Patient limited by fatigue Patient left: in bed;with call bell/phone within reach;with family/visitor present Nurse Communication: Mobility status PT Visit Diagnosis: Unsteadiness on feet (R26.81);Muscle weakness (generalized) (M62.81)    Time: 1791-5056 PT Time Calculation (min) (ACUTE ONLY): 26 min   Charges:   PT Evaluation $PT Eval Moderate Complexity: 1 Mod          Talal Fritchman W,PT Acute Rehabilitation Services Pager:  [phone]  Office:  [phone]    Berline Lopes 02/10/2020, 3:18 PM

## 2020-02-10 NOTE — Progress Notes (Signed)
eLink Physician-Brief Progress Note Patient Name: Kristina Nichols DOB: 10-Apr-1966 MRN: [id number]   Date of Service  02/10/2020  HPI/Events of Note  Generalized musculoskeletal aches s/p CPR. Patient is NPO.  eICU Interventions   iv Tylenol ordered prn pain, will also check a CMP to make sure LFT's are okay as I do not see any recent testing.        Kristina Nichols 02/10/2020, 2:33 AM

## 2020-02-10 NOTE — Progress Notes (Signed)
  Speech Language Pathology Treatment: Dysphagia  Patient Details Name: Kristina Nichols MRN: [id number] DOB: October 18, 1965 Today's Date: 02/10/2020 Time: 7425-9563 SLP Time Calculation (min) (ACUTE ONLY): 38 min  Assessment / Plan / Recommendation Clinical Impression  Pt was seen for dysphagia treatment. She was alert and cooperative throughout the session, but expressed having fear regarding swallowing. Pt remains dysphonic with significantly reduced vocal intensity, suggesting vocal fold insufficiency and increasing aspiration risk. Pt tolerated puree, regular texture solids,and nectar thick liquids via cup and straw and without overt s/sx of aspiration. She inconsistently exhibited throat clearing and coughing with ice chips, thin liquids, and mechanical soft solids, suggesting aspiration. Mastication time was [facility] and no significant oral residue was noted. Pt reported that at baseline pills sometimes "don't do down" and that she inconsistently coughs with thin liquids. Acute on chronic dysphagia secondary to intubation is therefore suspected. Pt's RN indicated that MD would like pt to remain on the unit this weekend due to pt's anxiety. A dysphagia 2 diet with nectar thick liquids will be initiated at this time and meds may be given whole in applesauce. Pt's husband arrived towards the end of the session and he was educated regarding results and recommendations. SLP will follow to assess improvement in swallow function, to determine readiness for diet advancement, and for completion of instrumental assessment if still clinically indicated.    HPI HPI: Pt is a 54 year old woman with a history of gastroparesis, GERD, depression and anxiety who presented with stress-induced cardiomyopathy causing recurrent V. fib and torsades cardiac arrests. She had witnessed LOC on 10/24, possibly seizure activity by her husband.  7 min of CPR reported, including Chest compressions. Intubated in ED 10/24-10/28. Taken for [facility]  with clean coronaries, findings consistent with stress induced cardiomyopathy, LVEF < 25%.       SLP Plan  Continue with current plan of care (MBS/FEES if symptoms persist)       Recommendations  Diet recommendations: Dysphagia 2 (fine chop);Nectar-thick liquid Liquids provided via: Cup;Straw Medication Administration: Whole meds with puree Supervision: Patient able to self feed Compensations: Slow rate;Small sips/bites Postural Changes and/or Swallow Maneuvers: Seated upright 90 degrees                Oral Care Recommendations: Oral care prior to ice chip/H20;Oral care BID Follow up Recommendations: Other (comment) (TBD) SLP Visit Diagnosis: Dysphagia, unspecified (R13.10) Plan: Continue with current plan of care (MBS/FEES if symptoms persist)       Maripat Borba I. Vear Clock, MS, CCC-SLP Acute Rehabilitation Services Office number [phone] Pager [phone]                Scheryl Marten 02/10/2020, 10:32 AM

## 2020-02-10 NOTE — Progress Notes (Signed)
Progress Note  Patient Name: Kristina Nichols Date of Encounter: 02/10/2020  [facility] Cardiologist: new to [facility]  Subjective   Extubated. Improving.  Inpatient Medications    Scheduled Meds: . buPROPion  100 mg Oral TID  . carvedilol  3.125 mg Oral BID WC  . Chlorhexidine Gluconate Cloth  6 each Topical Daily  . digoxin  0.125 mg Oral Daily  . docusate sodium  100 mg Oral BID  . lamoTRIgine  150 mg Oral BID  . losartan  12.5 mg Oral Daily  . mouth rinse  15 mL Mouth Rinse BID  . methylPREDNISolone (SOLU-MEDROL) injection  40 mg Intravenous Q12H  . Racepinephrine HCl  0.5 mL Nebulization Once  . sodium chloride flush  10-40 mL Intracatheter Q12H  . spironolactone  25 mg Oral Daily   Continuous Infusions: . sodium chloride 10 mL/hr at 02/10/20 0400  . acetaminophen Stopped (02/10/20 0304)  . ceFEPime (MAXIPIME) IV 2 g (02/10/20 0001)  . lidocaine 2 mg/min (02/10/20 0400)   PRN Meds: acetaminophen, acetaminophen, clonazepam, influenza vac split quadrivalent PF, levalbuterol, polyethylene glycol, sodium chloride flush   Vital Signs    Vitals:   02/10/20 0300 02/10/20 0400 02/10/20 0500 02/10/20 0600  BP: 113/66 119/69 112/73 122/77  Pulse: 84 86 88 81  Resp: 13 13 14    Temp: 99 F (37.2 C) 98.8 F (37.1 C) 98.8 F (37.1 C)   TempSrc:  Core    SpO2: 100% 98% 100% 100%  Weight:    76.7 kg  Height:        Intake/Output Summary (Last 24 hours) at 02/10/2020 0631 Last data filed at 02/10/2020 0600 Gross per 24 hour  Intake 1312.92 ml  Output 3775 ml  Net -2462.08 ml   Last 3 Weights 02/10/2020 02/09/2020 02/08/2020  Weight (lbs) 169 lb 1.5 oz 161 lb 13.1 oz 165 lb 5.5 oz  Weight (kg) 76.7 kg 73.4 kg 75 kg      Telemetry    SR 80's generally, rare PVCs, last VT (TdP) was nonsustained episode about 1330 on 10/25 - Personally Reviewed  ECG    No new  Physical Exam   GEN: AAO Neck: no JVP Cardiac: RRR, no murmurs, rubs, or gallops.  Respiratory:  clear/intubated GI: Soft, nontender, non-distended  MS: No edema; No deformity. Neuro:  no focal deficit Psych: appropriate   Labs    High Sensitivity Troponin:   Recent Labs  Lab 02/05/20 1745 02/05/20 1929 02/05/20 2107 02/05/20 2225  TROPONINIHS 33* 88* 119* 232*      Chemistry Recent Labs  Lab 02/05/20 2225 02/06/20 0214 02/07/20 1442 02/08/20 0306 02/09/20 0306  NA 137   < > 136 137 137  K 4.9   < > 4.2 3.9 4.2  CL 109   < > 108 107 108  CO2 22   < > 22 21* 23  GLUCOSE 199*   < > 142* 128* 113*  BUN 16   < > 12 10 15   CREATININE 0.94   < > 0.64 0.63 0.62  CALCIUM 7.0*   < > 7.7* 7.9* 8.2*  PROT 5.2*  --  4.9*  --   --   ALBUMIN 3.1*  --  2.8*  --   --   AST 898*  --  119*  --   --   ALT 482*  --  210*  --   --   ALKPHOS 75  --  67  --   --   BILITOT  0.8  --  0.6  --   --   GFRNONAA >60   < > >60 >60 >60  ANIONGAP 6   < > 6 9 6    < > = values in this interval not displayed.     Hematology Recent Labs  Lab 02/08/20 0558 02/09/20 0306 02/10/20 0214  WBC 8.4 6.6 7.3  RBC 3.21* 2.99* 3.20*  HGB 9.9* 9.2* 9.9*  HCT 31.2* 29.0* 30.9*  MCV 97.2 97.0 96.6  MCH 30.8 30.8 30.9  MCHC 31.7 31.7 32.0  RDW 13.9 14.1 13.5  PLT 75* 68* 101*    BNPNo results for input(s): BNP, PROBNP in the last 168 hours.   DDimer  Recent Labs  Lab 02/05/20 1937  DDIMER >20.00*     Radiology    CT Head Wo Contrast  Result Date: 02/05/2020 CLINICAL DATA:  54 year old female with altered mental status post cardiac arrest. EXAM: CT HEAD WITHOUT CONTRAST TECHNIQUE: Contiguous axial images were obtained from the base of the skull through the vertex without intravenous contrast. COMPARISON:  None. FINDINGS: Brain: No evidence of acute infarction, hemorrhage, hydrocephalus, extra-axial collection or mass lesion/mass effect. Vascular: No hyperdense vessel or unexpected calcification. Skull: Normal. Negative for fracture or focal lesion. Sinuses/Orbits: No acute finding. Other:  Oral intubation noted. IMPRESSION: No evidence of acute intracranial abnormality. Electronically Signed   By: 44 M.D.   On: 02/05/2020 18:48   DG Chest Port 1 View Result Date: 02/05/2020 CLINICAL DATA:  Status post CPR. EXAM: PORTABLE CHEST 1 VIEW COMPARISON:  February 05, 2020 (6:03 p.m.) FINDINGS: There is stable endotracheal tube and nasogastric tube positioning. Mild diffusely increased interstitial lung markings are seen with moderate to marked severity patchy left suprahilar and right upper lobe infiltrates. Mild bilateral lower lobe infiltrates are also seen. These areas are mildly increased in severity when compared to the prior study. The heart size and mediastinal contours are within normal limits. The visualized skeletal structures are unremarkable. IMPRESSION: Moderate to marked severity bilateral patchy infiltrates, as described above, with mild interval increase in severity when compared to the prior exam. Electronically Signed   By: February 07, 2020 M.D.   On: 02/05/2020 20:25   DG Chest Portable 1 View  Result Date: 02/05/2020 CLINICAL DATA:  Post intubation, witnessed cardiac arrest and CPR EXAM: PORTABLE CHEST 1 VIEW COMPARISON:  None FINDINGS: Endotracheal tube terminates between the clavicles approximately 4 cm above the carina. Gastric tube courses through in a off the field of the radiograph, side port in the region of the mid stomach, tip off the field of view. Pacer defibrillator pads project over the patient. Upper lobe opacities are present with some elevation of the minor fissure in the RIGHT chest. Less opacity at the LEFT lung apex. On limited assessment no acute skeletal process. IMPRESSION: 1. Endotracheal tube terminates between the clavicles approximately 4 cm above the carina. 2. Suggested volume loss and consolidative changes in the RIGHT upper lobe greater than LEFT upper lobe. Findings could be related to volume loss with pneumonitis from aspiration and  potential background of asymmetric pulmonary edema or pneumonia. Underlying mass would be difficult to exclude. Follow-up after resolution of acute symptoms is suggested to exclude underlying lesion. 3. Gastric tube courses through a off the field of the radiograph, side port in the region of the mid stomach, tip off the field of view. Electronically Signed   By: 02/07/2020 M.D.   On: 02/05/2020 18:20    Cardiac Studies  02/06/2020: TTE IMPRESSIONS  1. No LV thrombus visualized, thought cannot excluded given LV substrate.  Left ventricular ejection fraction, by estimation, is <20%. The left  ventricle has severely decreased function. The left ventricle demonstrates  global hypokinesis. Left  ventricular diastolic parameters are consistent with Grade I diastolic  dysfunction (impaired relaxation).  2. Right ventricular systolic function is normal. The right ventricular  size is normal.  3. The mitral valve is grossly normal. No evidence of mitral valve  regurgitation.  4. The aortic valve was not well visualized. Aortic valve regurgitation  is trivial.  5. Aortic IABP in Descending Aorta.  6. The inferior vena cava is dilated in size with <50% respiratory  variability, suggesting right atrial pressure of 15 mmHg.   Comparison(s): No prior Echocardiogram.    02/06/2020: RHC, IABP insertion 1. Mildly elevated left and right heart filling pressures.  2. Low cardiac output by thermodilution on norepinephrine 4, preserved output by Fick.  3. Successful IABP placement.  4. Swan left in place right IJ.     02/06/2020: LHC  There is severe left ventricular systolic dysfunction.  LV end diastolic pressure is moderately elevated.  The left ventricular ejection fraction is less than 25% by visual estimate.  There is no mitral valve regurgitation.  1. No angiographic evidence of CAD 2. Severe segmental LV systolic dysfunction with wall motion suggesting Takotsubo's  cardiomyopathy (stress induced cardiomyopathy).  3. Elevated LVEDP 4. Ventricular fibrillation/Torsades-Temporary Pacemaker placed per request of admitting/consulting team in case of need of overdrive pacing tonight  Recommendations: No further ischemic workup. Management of VF/Torsades per EP team in the am. Medical management of likely stress induced cardiomyopathy.     Patient Profile     54 y.o. female with a hx of depression/anxiety, hypothyroidism (off tx), GERD, chronic granulomatosis carrier (?), no noted past cardiac history admitted with VF arrest  She has had recurrent TdP requiring multiple defibrillations  Assessment & Plan    1. Torsades 2. Cardiogenic shock  Electrically quiescent.  Recommendations: 1. Recommend starting Mexiletine 150 mg by mouth 3 times daily.  Recommend discontinuing lidocaine drip 2 hours after first dose of mexiletine has been given.  If patient has breakthrough polymorphic VT on mexiletine, would load with amiodarone. 2.  Continued hemodynamic optimization for heart failure team 3.  Patient is a candidate for secondary prevention ICD.  Recommend this be placed prior to discharge.    For questions or updates, please contact [facility] Please consult [email or web address] for contact info under        Signed, Lanier Prude, MD  02/10/2020, 6:31 AM

## 2020-02-10 NOTE — Progress Notes (Signed)
eLink Physician-Brief Progress Note Patient Name: Kristina Nichols DOB: 1966/02/12 MRN: [id number]   Date of Service  02/10/2020  HPI/Events of Note  Patient extubated 10/28, had some respiratory difficulties following extubation, E-link asked to check up on her tonight.  eICU Interventions  Patient resting comfortably, saturation on nasal cannula is 98 %, no stridor or audible wheezing noted.        Thomasene Lot Angalina Ante 02/10/2020, 1:07 AM

## 2020-02-10 NOTE — Plan of Care (Signed)
Plan to transfer out of ICU to Progressive when transitioned off of lidocaine infusion and central access removed.  Signed out to [facility], for [facility] to take over care 10/30 with PCCM off.    Tessie Fass MSN, AGACNP-BC [place] [facility] 02/10/2020, 12:07 PM

## 2020-02-10 NOTE — Progress Notes (Signed)
NAME:  Kristina Nichols, MRN:  [id number], DOB:  [birth year], LOS: 5 ADMISSION DATE:  02/05/2020, CONSULTATION DATE:  02/05/20 REFERRING MD: Freida Busman (ED), CHIEF COMPLAINT:  Cardiac arrest  Brief History   54 yo woman here after witnessed cardiac arrest at home with refractory vib/ torsades.  Intubated in ER.  Taken for [facility] with clean coronaries, findings consistent with stress induced cardiomyopathy, LVEF < 25%.     History of present illness   Witnessed loss of consciousness, possibly seizure activity by her husband.   7 min of CPR reported, including Chest compressions done by her husband who is reportedly an EMT.  EMS cardioverted her and transported her, reportedly gave her sedating medication for agitation and reaching for the NP airway in route.  She was immediately intubated in the ED for airway protection.  Initial work up was done, relatively stable initially.  Started on propofol  Developed refractory vfib/torsades in the ED.   Multiple cardioversions.   Mag 2gm given x 2 Lidocaine 100mg  given x1. Started on amiodarone gtt.  Per nurse received a total of 2L NS.  Tox screen pos for amphetamines, benzodiazepenes.  Mild hypotension, MAP mid 60s, following arrest. Cardiology has evaluated, patient being taken to cath lab for coronary angiography.    Past Medical History  HLD Depression/Anxiety - Klonopin, lamictal GERD Hypothyroidism - stopped med 08/2017 per last clinic note Insomnia - trazodone Hearing loss Endometriosis Gastroparesis Lymphedema Sp cholecystectomy, hyterectomy DOE 08/2017  Meds: wellbutrin, klonopin, neuronitn, advil, lamictal, omeprazole, levothyroxine, vyvanse   Significant Hospital Events   10/24 Admitted  10/25 IABP, Heart failure and EP consulted  10/27 IABP 1:2. plt down trending. Remains febrile, starting abx. IABP out 10/28 WUA/SBT  Consults:  Cardiology EP Adv HF  Procedures:  10/24 ETT >> 10/24 R femoral sheath >>10/25, temp pacer removed;  IABP inserted >> 10/25 R IJ cordis with PA catheter >>  Significant Diagnostic Tests:  10/24 [facility] >> neg  Cardiac cath 10/24 >>  There is severe left ventricular systolic dysfunction.  LV end diastolic pressure is moderately elevated.  The left ventricular ejection fraction is less than 25% by visual estimate.  There is no mitral valve regurgitation. 1. No angiographic evidence of CAD 2. Severe segmental LV systolic dysfunction with wall motion suggesting Takotsubo's cardiomyopathy (stress induced cardiomyopathy).  3. Elevated LVEDP 4. Ventricular fibrillation/Torsades-Temporary Pacemaker placed per request of admitting/consulting team in case of need of overdrive pacing tonight  10/25 RHC >> 1. Mildly elevated left and right heart filling pressures.  2. Low cardiac output by thermodilution on norepinephrine 4, preserved output by Fick.  3. Successful IABP placement.  4. Swan left in place right IJ.  RA mean 12 RV 31/13 PA 33/19, mean 24 PCWP mean 17 Oxygen saturations: PA 73% AO 100% Cardiac Output (Thermo) 3.53 Cardiac Index (Thermo) 1.95 PVR 2 WU Cardiac Output (Fick) 4.95 Cardiac Index (Fick) 2.74  10/25 TTE >> 1. No LV thrombus visualized, thought cannot excluded given LV substrate.  Left ventricular ejection fraction, by estimation, is <20%. The left ventricle has severely decreased function. The left ventricle demonstrates global hypokinesis. Left  ventricular diastolic parameters are consistent with Grade I diastolic dysfunction (impaired relaxation).  2. Right ventricular systolic function is normal. The right ventricular size is normal.  3. The mitral valve is grossly normal. No evidence of mitral valve regurgitation.  4. The aortic valve was not well visualized. Aortic valve regurgitation is trivial.  5. Aortic IABP in Descending Aorta.  6. The  inferior vena cava is dilated in size with <50% respiratory variability, suggesting right atrial pressure of 15  mmHg.  Micro Data:  10/24 SARS2 >> neg 10/24 MRSA PCR >> neg 10/26 BCx 2 > no growth  Antimicrobials:  10/27> vanc 10/27> cefepime  Interim history/subjective:  Stridor yesterday afternoon, improved after racemic, solumedrol, xopenex, bipap  Off bipap  More alert and oriented today  Objective   Blood pressure (!) 107/58, pulse 76, temperature 98.1 F (36.7 C), resp. rate 16, height 5\' 5"  (1.651 m), weight 76.7 kg, SpO2 100 %. PAP: (16-31)/(8-20) 16/8 CVP:  [0 mmHg-9 mmHg] 3 mmHg CO:  [5.6 L/min-7.5 L/min] 7.5 L/min CI:  [3.1 L/min/m2-4.1 L/min/m2] 4.1 L/min/m2  Vent Mode: PRVC FiO2 (%):  [36 %-40 %] 36 % Set Rate:  [22 bmp] 22 bmp Vt Set:  [450 mL] 450 mL PEEP:  [5 cmH20] 5 cmH20 Plateau Pressure:  [17 cmH20] 17 cmH20   Intake/Output Summary (Last 24 hours) at 02/10/2020 0731 Last data filed at 02/10/2020 0600 Gross per 24 hour  Intake 1312.92 ml  Output 3775 ml  Net -2462.08 ml   Filed Weights   02/08/20 0419 02/09/20 0400 02/10/20 0600  Weight: 75 kg 73.4 kg 76.7 kg   Examination: General:  WDWN ill appearing middle aged F, seated in recliner NAD on [place]  HEENT: NCAT pink mmm patent nares with Economy in place anicteric sclera Neuro: Awake, oriented x3. PERRLA. 5/5 BUE BLE CV: RRR s1s2 no rgm cap refill brisk PULM:  Hoarse voice. No rhonchi, crackles. No accessory muscle use  GI: soft round ndnt + bowel sounds  Extremities: No obvious joint deformity. No cyanosis ro clubbing. Symmetrical bulk Skin: c/d/w without rash  Resolved Hospital Problem list   Cardiogenic shock   Assessment & Plan:   Acute systolic heart failure  Stress induced cardiomyopathy (takotsubo)  Recurrent Vfib/ Torsades with brief cardiac arrest  - LHC 10/25 with clean coronaries - patient has been under major recent stress due to critical illness of her son - TTE 10/25 EF 20%, RV systolic function normal  P:  IABP out  EP rec to start mexilitine 150mg  TID, dc lido gtt 2 hours after first  dose of mexilitine-- this is pending ability to take PO meds or placement of cortrak If breakthrough VT, amiodarone  Recommend ICD prior to dc  Coreg, digoxin, spironolactone  Acute encephalopathy, improving ICU delirium  P:  -delirium precautions. Strongly favor environmental measures for anxiolysis vs additional pharmacotherapy beyond pts baseline regimen for concomitant mood disorder -minimize CNS depressing medications    Acute hypoxic respiratory failure requiring intubation, improved  -in setting of cardiac arrest, acute systolic heart failure Pulmonary edema, improved  -in setting of acute systolic heart failure Stridor, improved  -new onset 10/28 afternoon hours after extubation -s/p racemic epi, bd, IV solumedrol, bipap P:  -Supplemental O2 for goal > 92%. Continues on supplemental O2 for anxiety at pt mandate more so than hypoxia -IS -will dc solumedrol today  -PRN xopenex  -mobility   Dysphagia P -SLP following -NPO -if fails to improve today (10/29),  cortrak   Thrombocytopenia, improved after dc IABP -suspect due to IABP, HIT less likely P: Lovenox  Fever, improving  Elevated Ddimer -pct reassuring at <0.1 -TTE 10/25- no LV thrombus visualized but can not exclude given IABP P:  Dc abx  PRN APAP  Mood disorder -depression, anxiety P:  Lamictal, buproprion on hold until able to take PO or until cortrak placed PRN clonazepam Continuing  to hold vyvanse  Best practice:  Diet: NPO Pain/Anxiety/Delirium protocol (if indicated): na VAP protocol (if indicated): yes DVT prophylaxis: lovenox  GI prophylaxis: pepcid per tube  Glucose control: CBG q4, add SSI if > 180 Mobility: bed Code Status: Full Family Communication: pt and husband updated at bedside Disposition: In ICU. May be approaching readiness for transfer out of unit  Labs   CBC: Recent Labs  Lab 02/07/20 0048 02/08/20 0306 02/08/20 0558 02/09/20 0306 02/10/20 0214  WBC 9.7 7.4 8.4 6.6  7.3  HGB 12.1 10.3* 9.9* 9.2* 9.9*  HCT 37.2 32.4* 31.2* 29.0* 30.9*  MCV 96.1 97.9 97.2 97.0 96.6  PLT 143* 71* 75* 68* 101*    Basic Metabolic Panel: Recent Labs  Lab 02/06/20 0214 02/06/20 1009 02/06/20 1543 02/06/20 1543 02/07/20 0048 02/07/20 1442 02/08/20 0306 02/09/20 0306 02/10/20 0214  NA 139   < > 135  --  136 136 137 137  --   K 3.9   < > 3.8  --  3.7 4.2 3.9 4.2  --   CL 108   < > 104  --  107 108 107 108  --   CO2 25   < > 23  --  22 22 21* 23  --   GLUCOSE 144*   < > 120*  --  124* 142* 128* 113*  --   BUN 14   < > 13  --  16 12 10 15   --   CREATININE 0.90   < > 0.73  --  0.73 0.64 0.63 0.62  --   CALCIUM 7.8*   < > 8.0*  --  8.0* 7.7* 7.9* 8.2*  --   MG 2.6*  --  2.3   < > 2.2 2.3 2.0 1.8 2.0  PHOS 2.3*  --   --   --   --   --   --   --   --    < > = values in this interval not displayed.   GFR: Estimated Creatinine Clearance: 85.2 mL/min (by C-G formula based on SCr of 0.62 mg/dL). Recent Labs  Lab 02/05/20 1745 02/05/20 1745 02/05/20 1937 02/05/20 2225 02/06/20 0110 02/06/20 0214 02/07/20 0048 02/07/20 1442 02/08/20 0306 02/08/20 0558 02/09/20 0306 02/10/20 0214  PROCALCITON  --   --   --   --   --   --   --  0.10 <0.10  --   --   --   WBC 8.1   < >  --  8.7  --    < >   < >  --  7.4 8.4 6.6 7.3  LATICACIDVEN 3.8*  --  2.4* 1.3 1.9  --   --   --   --   --   --   --    < > = values in this interval not displayed.    Liver Function Tests: Recent Labs  Lab 02/05/20 2225 02/07/20 1442  AST 898* 119*  ALT 482* 210*  ALKPHOS 75 67  BILITOT 0.8 0.6  PROT 5.2* 4.9*  ALBUMIN 3.1* 2.8*   No results for input(s): LIPASE, AMYLASE in the last 168 hours. No results for input(s): AMMONIA in the last 168 hours.  ABG    Component Value Date/Time   PHART 7.298 (L) 02/05/2020 1853   PCO2ART 52.4 (H) 02/05/2020 1853   PO2ART 255 (H) 02/05/2020 1853   HCO3 23.9 02/06/2020 1009   HCO3 23.7 02/06/2020 1009   TCO2 25 02/06/2020  1009   TCO2 25  02/06/2020 1009   ACIDBASEDEF 1.0 02/05/2020 1853   O2SAT 81.2 02/10/2020 0202     Coagulation Profile: Recent Labs  Lab 02/05/20 1745  INR 1.0    Cardiac Enzymes: No results for input(s): CKTOTAL, CKMB, CKMBINDEX, TROPONINI in the last 168 hours.  HbA1C: Hgb A1c MFr Bld  Date/Time Value Ref Range Status  02/06/2020 12:41 PM 5.3 4.8 - 5.6 % Final    Comment:    (NOTE)         Prediabetes: 5.7 - 6.4         Diabetes: >6.4         Glycemic control for adults with diabetes: <7.0     CBG: Recent Labs  Lab 02/09/20 0804 02/09/20 1135 02/09/20 1718 02/09/20 2021 02/10/20 0356  GLUCAP 88 121* 135* 153* 118*     CRITICAL CARE Performed by: Lanier Clam   Total critical care time: 60 minutes  Critical care time was exclusive of separately billable procedures and treating other patients. Critical care was necessary to treat or prevent imminent or life-threatening deterioration.  Critical care was time spent personally by me on the following activities: development of treatment plan with patient and/or surrogate as well as nursing, discussions with consultants, evaluation of patient's response to treatment, examination of patient, obtaining history from patient or surrogate, ordering and performing treatments and interventions, ordering and review of laboratory studies, ordering and review of radiographic studies, pulse oximetry and re-evaluation of patient's condition.  Tessie Fass MSN, AGACNP-BC [place] [facility] [phone] If no answer, [phone] 02/10/2020, 7:31 AM

## 2020-02-10 NOTE — Progress Notes (Signed)
Nutrition Follow-up  DOCUMENTATION CODES:   Not applicable  INTERVENTION:    Magic cup TID with meals, each supplement provides 290 kcal and 9 grams of protein  NUTRITION DIAGNOSIS:   Inadequate oral intake related to inability to eat as evidenced by NPO status.  Ongoing, diet just advanced  GOAL:   Patient will meet greater than or equal to 90% of their needs  Progressing   MONITOR:   Vent status, Labs, TF tolerance, Skin  REASON FOR ASSESSMENT:   Ventilator, Consult Enteral/tube feeding initiation and management  ASSESSMENT:   55 yo female admitted after witnessed cardiac arrest at home. Required intubation in the ED. PMH includes HLD, depression, GERD, hypothyroidism, gastroparesis, lymphedema.  Extubated 10/28. S/P swallow evaluation with SLP this morning. Diet advanced to dysphagia 2 with nectar thick liquids. First meal will be lunch today. Will receive thickened PO supplements on meal trays.   Labs reviewed.  CBG: 118-89  Medications reviewed and include colace, spironolactone.  Diet Order:   Diet Order            DIET DYS 2 Room service appropriate? Yes with Assist; Fluid consistency: Nectar Thick  Diet effective now                 EDUCATION NEEDS:   Not appropriate for education at this time  Skin:  Skin Assessment: Reviewed RN Assessment  Last BM:  no BM documented  Height:   Ht Readings from Last 1 Encounters:  02/05/20 5\' 5"  (1.651 m)    Weight:   Wt Readings from Last 1 Encounters:  02/10/20 76.7 kg    BMI:  Body mass index is 28.14 kg/m.  Estimated Nutritional Needs:   Kcal:  1800-2000  Protein:  100-110 gm  Fluid:  >/= 1.8 L    02/12/20, RD, LDN, CNSC Please refer to Amion for contact information.

## 2020-02-10 NOTE — Evaluation (Signed)
Occupational Therapy Evaluation Patient Details Name: Kristina Nichols MRN: [id number] DOB: Jun 29, 1965 Today's Date: 02/10/2020    History of Present Illness Pt is a 54 year old woman admitted on 02/05/20 with stress induced cardiomyopathy resulting in recurrent v-fib and torsades arrests. Intubated in ED, extubated 02/09/20. PMH: anxiety, depression, hypothyroidism, HLD, gastroparesis.   Clinical Impression   Pt was independent prior to admission. Presents with anxiety, impaired cognition, generalized weakness and impaired standing balance. Pt requires +2 min assist for OOB, limited to standing and stepping along EOB due to Swan-ganz in place and up to moderate assistance for ADL. Pt has a supportive husband who plans to provide 24 hour care upon discharge. Will follow acutely.    Follow Up Recommendations  Home health OT;Supervision/Assistance - 24 hour    Equipment Recommendations  3 in 1 bedside commode    Recommendations for Other Services       Precautions / Restrictions Precautions Precautions: Fall      Mobility Bed Mobility Overal bed mobility: Needs Assistance Bed Mobility: Supine to Sit;Sit to Supine     Supine to sit: Min assist Sit to supine: Min assist   General bed mobility comments: assist for hips to EOB using bed pad and for LEs back into bed    Transfers Overall transfer level: Needs assistance Equipment used: 2 person hand held assist Transfers: Sit to/from Stand Sit to Stand: +2 physical assistance;Min assist         General transfer comment: increased time, assist to rise and steady    Balance Overall balance assessment: Needs assistance   Sitting balance-Leahy Scale: Fair       Standing balance-Leahy Scale: Fair                             ADL either performed or assessed with clinical judgement   ADL Overall ADL's : Needs assistance/impaired Eating/Feeding: Set up;Sitting   Grooming: Set up;Sitting   Upper Body  Bathing: Moderate assistance;Sitting   Lower Body Bathing: Moderate assistance;Sit to/from stand   Upper Body Dressing : Sitting;Moderate assistance   Lower Body Dressing: Moderate assistance;Sit to/from stand   Toilet Transfer: Minimal assistance;+2 for safety/equipment   Toileting- Clothing Manipulation and Hygiene: Moderate assistance;Sit to/from stand       Functional mobility during ADLs: +2 for safety/equipment;Minimal assistance (took steps along EOB)       Vision Baseline Vision/History: Wears glasses Wears Glasses: Reading only Patient Visual Report: No change from baseline       Perception     Praxis      Pertinent Vitals/Pain Pain Assessment: Faces Faces Pain Scale: Hurts even more Pain Location: chest from compressions Pain Descriptors / Indicators: Sore;Grimacing;Discomfort Pain Intervention(s): Monitored during session;Repositioned     Hand Dominance Right   Extremity/Trunk Assessment Upper Extremity Assessment Upper Extremity Assessment: Generalized weakness;Overall [facility] for tasks assessed   Lower Extremity Assessment Lower Extremity Assessment: Defer to PT evaluation   Cervical / Trunk Assessment Cervical / Trunk Assessment: Normal   Communication Communication Communication: Expressive difficulties (low volume)   Cognition Arousal/Alertness: Awake/alert Behavior During Therapy: Anxious Overall Cognitive Status: Impaired/Different from baseline Area of Impairment: Orientation;Attention;Memory;Following commands;Awareness;Problem solving                 Orientation Level: Disoriented to;Situation;Time;Place Current Attention Level: Focused Memory: Decreased short-term memory Following Commands: Follows one step commands with increased time;Follows one step commands inconsistently (and multimodal cues)     Problem  Solving: Slow processing;Decreased initiation;Difficulty sequencing;Requires verbal cues;Requires tactile cues General  Comments: verbose, tangential   General Comments  78 bpm, 96% 4 LO2, 18, 116/66    Exercises     Shoulder Instructions      Home Living Family/patient expects to be discharged to:: Private residence Living Arrangements: Spouse/significant other;Children (16 year old son is on CIR) Available Help at Discharge: Family;Available 24 hours/day (husband to take leave) Type of Home: House Home Access: Stairs to enter [employer] of Steps: 1   Home Layout: Two level;Bed/bath upstairs;1/2 bath on main level     Bathroom Shower/Tub: Chief Strategy Officer: Standard     Home Equipment: None          Prior Functioning/Environment Level of Independence: Independent  Gait / Transfers Assistance Needed: I with gait ADL's / Homemaking Assistance Needed: shares cooking with husband, drives   Comments: Pt likes to read.         OT Problem List: Decreased strength;Decreased activity tolerance;Impaired balance (sitting and/or standing);Decreased cognition;Decreased knowledge of use of DME or AE;Pain      OT Treatment/Interventions: Self-care/ADL training;DME and/or AE instruction;Patient/family education;Balance training;Cognitive remediation/compensation;Therapeutic activities    OT Goals(Current goals can be found in the care plan section) Acute Rehab OT Goals Patient Stated Goal: return home OT Goal Formulation: With patient/family Time For Goal Achievement: 03/02/20 Potential to Achieve Goals: Good ADL Goals Pt Will Perform Grooming: with supervision;standing Pt Will Perform Upper Body Dressing: with set-up;sitting Pt Will Perform Lower Body Dressing: with supervision;sit to/from stand Pt Will Transfer to Toilet: with supervision;ambulating;bedside commode (over toilet) Pt Will Perform Toileting - Clothing Manipulation and hygiene: with supervision;sit to/from stand Additional ADL Goal #1: Pt will follow one step commands within 5 seconds of request with  50% accuracy. Additional ADL Goal #2: Pt will demonstrate sustained attention during ADL.  OT Frequency: Min 2X/week   Barriers to D/C:            Co-evaluation PT/OT/SLP Co-Evaluation/Treatment: Yes Reason for Co-Treatment: For patient/therapist safety   OT goals addressed during session: ADL's and self-care      AM-PAC OT "6 Clicks" Daily Activity     Outcome Measure Help from another person eating meals?: A Little Help from another person taking care of personal grooming?: A Little Help from another person toileting, which includes using toliet, bedpan, or urinal?: A Lot Help from another person bathing (including washing, rinsing, drying)?: A Lot Help from another person to put on and taking off regular upper body clothing?: A Lot Help from another person to put on and taking off regular lower body clothing?: A Lot 6 Click Score: 14   End of Session Equipment Utilized During Treatment: Oxygen (4L)  Activity Tolerance: Patient tolerated treatment well Patient left: in bed;with call bell/phone within reach;with family/visitor present  OT Visit Diagnosis: Unsteadiness on feet (R26.81);Other abnormalities of gait and mobility (R26.89);Pain;Muscle weakness (generalized) (M62.81);Other symptoms and signs involving cognitive function                Time: 1325-1351 OT Time Calculation (min): 26 min Charges:  OT General Charges $OT Visit: 1 Visit OT Evaluation $OT Eval Moderate Complexity: 1 Mod  Martie Round, OTR/L Acute Rehabilitation Services Pager: [phone] Office: [phone]  Evern Bio 02/10/2020, 2:09 PM

## 2020-02-10 NOTE — Progress Notes (Addendum)
Advanced Heart Failure Rounding Note  PCP-Cardiologist: No primary care provider on file.   Subjective:    10/25 S/P IABP.  Temp pacer removed. 10/27 IABP removed.  10/28 Extubated. Placed back on Bipap for several hours. Given solumedrol and later weaned to 4 liters. Failed swallow study.   PAP: (16-31)/(8-20) 16/8 CVP:  [0 mmHg-9 mmHg] 3 mmHg CO:  [5.6 L/min-7.5 L/min] 7.5 L/min CI:  [3.1 L/min/m2-4.1 L/min/m2] 4.1 L/min/m2  CO-OX 81%   Denies SOB. Denies pain.    Objective:   Weight Range: 76.7 kg Body mass index is 28.14 kg/m.   Vital Signs:   Temp:  [97.7 F (36.5 C)-100.6 F (38.1 C)] 98.1 F (36.7 C) (10/29 0700) Pulse Rate:  [68-89] 76 (10/29 0700) Resp:  [10-22] 16 (10/29 0700) BP: (96-146)/(51-86) 107/58 (10/29 0700) SpO2:  [95 %-100 %] 100 % (10/29 0700) FiO2 (%):  [36 %-40 %] 36 % (10/28 1012) Weight:  [76.7 kg] 76.7 kg (10/29 0600) Last BM Date:  (Unknown; PTA)  Weight change: Filed Weights   02/08/20 0419 02/09/20 0400 02/10/20 0600  Weight: 75 kg 73.4 kg 76.7 kg    Intake/Output:   Intake/Output Summary (Last 24 hours) at 02/10/2020 0738 Last data filed at 02/10/2020 0600 Gross per 24 hour  Intake 1312.92 ml  Output 3775 ml  Net -2462.08 ml      Physical Exam   General:  Sitting in the chair. No resp difficulty HEENT: normal Neck: supple. no JVD. Carotids 2+ bilat; no bruits. No lymphadenopathy or thryomegaly appreciated. RIJ swan Cor: PMI nondisplaced. Regular rate & rhythm. No rubs, gallops or murmurs. Lungs: clear Abdomen: soft, nontender, nondistended. No hepatosplenomegaly. No bruits or masses. Good bowel sounds. Extremities: no cyanosis, clubbing, rash, edema Neuro: alert & orientedx2, cranial nerves grossly intact. moves all 4 extremities w/o difficulty. Affect flar   Telemetry  SR no VT 80s   EKG    N/a   Labs    CBC Recent Labs    02/09/20 0306 02/10/20 0214  WBC 6.6 7.3  HGB 9.2* 9.9*  HCT 29.0* 30.9*   MCV 97.0 96.6  PLT 68* 101*   Basic Metabolic Panel Recent Labs    33/82/50 0306 02/08/20 0306 02/09/20 0306 02/10/20 0214  NA 137  --  137  --   K 3.9  --  4.2  --   CL 107  --  108  --   CO2 21*  --  23  --   GLUCOSE 128*  --  113*  --   BUN 10  --  15  --   CREATININE 0.63  --  0.62  --   CALCIUM 7.9*  --  8.2*  --   MG 2.0   < > 1.8 2.0   < > = values in this interval not displayed.   Liver Function Tests Recent Labs    02/07/20 1442  AST 119*  ALT 210*  ALKPHOS 67  BILITOT 0.6  PROT 4.9*  ALBUMIN 2.8*   No results for input(s): LIPASE, AMYLASE in the last 72 hours. Cardiac Enzymes No results for input(s): CKTOTAL, CKMB, CKMBINDEX, TROPONINI in the last 72 hours.  BNP: BNP (last 3 results) No results for input(s): BNP in the last 8760 hours.  ProBNP (last 3 results) No results for input(s): PROBNP in the last 8760 hours.   D-Dimer No results for input(s): DDIMER in the last 72 hours. Hemoglobin A1C No results for input(s): HGBA1C in the last 72  hours. Fasting Lipid Panel Recent Labs    02/08/20 0306  TRIG 85   Thyroid Function Tests No results for input(s): TSH, T4TOTAL, T3FREE, THYROIDAB in the last 72 hours.  Invalid input(s): FREET3  Other results:   Imaging    No results found.   Medications:     Scheduled Medications: . buPROPion  100 mg Oral TID  . carvedilol  3.125 mg Oral BID WC  . Chlorhexidine Gluconate Cloth  6 each Topical Daily  . digoxin  0.125 mg Oral Daily  . docusate sodium  100 mg Oral BID  . lamoTRIgine  150 mg Oral BID  . losartan  12.5 mg Oral Daily  . mouth rinse  15 mL Mouth Rinse BID  . methylPREDNISolone (SOLU-MEDROL) injection  40 mg Intravenous Q12H  . Racepinephrine HCl  0.5 mL Nebulization Once  . sodium chloride flush  10-40 mL Intracatheter Q12H  . spironolactone  25 mg Oral Daily    Infusions: . sodium chloride 10 mL/hr at 02/10/20 0400  . acetaminophen Stopped (02/10/20 0304)  . ceFEPime  (MAXIPIME) IV 2 g (02/10/20 0001)  . lidocaine 2 mg/min (02/10/20 0400)    PRN Medications: acetaminophen, acetaminophen, clonazepam, influenza vac split quadrivalent PF, levalbuterol, polyethylene glycol, sodium chloride flush    Patient Profile  Kristina Nichols is a 54 year old with h/o witnessed cardiac arrest at homewith refractory vib/ torsades. Intubated in ER. Taken for [facility] with clean coronaries, findings consistent with stress induced cardiomyopathy, LVEF <25%.   Cardiogenic shock/torsades. Multiple shocks.   Assessment/Plan  1. V Fib /V tachArrest- refractory torsades -Cath last night with normal coronaries. Temp wire removed 10/25.  -Multiple shocks. Last shock 02/06/20.  -EP consulted. Attempted to RV pace but went back in torsades.  - Was on amio drip initially but stopped due to prolonged Qtc.  - Continue lidocaine drip 2 mg per hour. Lidocaine level 5 >pending.  - Per EP -Should she have increased ectopy with extubation could consider salvage ablation.  - Will need ICD prior to discharged.  - Keep lidocaine drip until she passed swallow evaluation. Then start mexiletine.  - K stable.  - Give 2 grams Mag today.    2. Cardiogenic Shock /Acute Systolic HF -Cath no coronary disease, LV gram concerning for Takotsbo, EF 20%. - ECHO 10/25 RV ok, LV 20%.   - Elevated LFTs in setting of shock. - IABP out 10/27  - Holding oral medications until swallow evaluation completed.  - We will continue coreg, spiro, and losartan once swallow evaluation completed.  - Renal function stable.    3. Acute Respiratory Failure -Vent per CCM with plans to extubated 02/09/20 - Sats stable on 4 liters.    4. Depression On wellbutrin/klonipin prior to admit.   5. Elevated LFTs Suspect in the setting of shock  6. ID Blood Cultures obtained 10/26--> NGTD Febrile T 100.6. Continue  vanc and cefepime.  Pro calcitonin < 0.1 WBC 7.3  7. Anemia  -Hgb trending down. 13.3>12>10.3->9.2  -> 9.9   - No obvious source.  8. Thrombocytopenia  -Platelets trending down to 143>71 >68> 101 -Off heparin - HIT pending  Remove Swan and foley. Would not place PICC with plan for ICD.   Consult PT/OT/Speech    Length of Stay: 5  Amy Clegg, NP  02/10/2020, 7:38 AM  Advanced Heart Failure Team Pager [phone] (M-F; 7a - 4p)  Please contact [facility] for night-coverage after hours (4p -7a ) and weekends on [email or web address]  Patient seen with NP, agree with the above note.   She remains in NSR today.  Was extubated yesterday, has not passed swallow.  She is awake/alert but somewhat confused.  CVP 3, CI 4.1 with co-ox 81%.   General: NAD, hoarse Neck: No JVD, no thyromegaly or thyroid nodule.  Lungs: Clear to auscultation bilaterally with normal respiratory effort. CV: Nondisplaced PMI.  Heart regular S1/S2, no S3/S4, no murmur.  No peripheral edema.  Abdomen: Soft, nontender, no hepatosplenomegaly, no distention.  Skin: Intact without lesions or rashes.  Neurologic: Alert and oriented x 3.  Psych: Normal affect. Extremities: No clubbing or cyanosis.  HEENT: Normal.   Swallow study today, resume po meds once she passes.  Will need NGT if she does not pass swallow.   Will eventually transition losartan to Entresto if BP stable on current med regimen.  Low CVP, does not need diuretic.  Can remove Swan.   Suspect Takotsubo cardiomyopathy, but with severity of event plan for ICD for secondary prevention per Dr. Lalla Brothers.  Will get cardiac MRI prior, not today given confusion.   Lidocaine IV resumed until she passes swallow, start mexiletine when swallow passed.   Platelets trending up with IABP out.   Marca Ancona 02/10/2020 8:29 AM

## 2020-02-11 LAB — GLUCOSE, CAPILLARY
Glucose-Capillary: 104 mg/dL — ABNORMAL HIGH (ref 70–99)
Glucose-Capillary: 111 mg/dL — ABNORMAL HIGH (ref 70–99)
Glucose-Capillary: 84 mg/dL (ref 70–99)
Glucose-Capillary: 96 mg/dL (ref 70–99)
Glucose-Capillary: 97 mg/dL (ref 70–99)

## 2020-02-11 LAB — MAGNESIUM: Magnesium: 1.8 mg/dL (ref 1.7–2.4)

## 2020-02-11 LAB — CBC
HCT: 35.7 % — ABNORMAL LOW (ref 36.0–46.0)
Hemoglobin: 11.3 g/dL — ABNORMAL LOW (ref 12.0–15.0)
MCH: 30.5 pg (ref 26.0–34.0)
MCHC: 31.7 g/dL (ref 30.0–36.0)
MCV: 96.2 fL (ref 80.0–100.0)
Platelets: 203 10*3/uL (ref 150–400)
RBC: 3.71 MIL/uL — ABNORMAL LOW (ref 3.87–5.11)
RDW: 13.2 % (ref 11.5–15.5)
WBC: 7.2 10*3/uL (ref 4.0–10.5)
nRBC: 0 % (ref 0.0–0.2)

## 2020-02-11 LAB — BRAIN NATRIURETIC PEPTIDE: B Natriuretic Peptide: 88.4 pg/mL (ref 0.0–100.0)

## 2020-02-11 LAB — COMPREHENSIVE METABOLIC PANEL
ALT: 101 U/L — ABNORMAL HIGH (ref 0–44)
AST: 53 U/L — ABNORMAL HIGH (ref 15–41)
Albumin: 3.2 g/dL — ABNORMAL LOW (ref 3.5–5.0)
Alkaline Phosphatase: 135 U/L — ABNORMAL HIGH (ref 38–126)
Anion gap: 9 (ref 5–15)
BUN: 17 mg/dL (ref 6–20)
CO2: 31 mmol/L (ref 22–32)
Calcium: 9.2 mg/dL (ref 8.9–10.3)
Chloride: 97 mmol/L — ABNORMAL LOW (ref 98–111)
Creatinine, Ser: 0.61 mg/dL (ref 0.44–1.00)
GFR, Estimated: >60 mL/min (ref >60)
Glucose, Bld: 125 mg/dL — ABNORMAL HIGH (ref 70–99)
Potassium: 4 mmol/L (ref 3.5–5.1)
Sodium: 137 mmol/L (ref 135–145)
Total Bilirubin: 0.5 mg/dL (ref 0.3–1.2)
Total Protein: 6.4 g/dL — ABNORMAL LOW (ref 6.5–8.1)

## 2020-02-11 MED ORDER — BUPROPION HCL ER (XL) 150 MG PO TB24: 300.0000 mg | ORAL_TABLET | Freq: Every morning | ORAL | Status: DC

## 2020-02-11 MED ORDER — LAMOTRIGINE 150 MG PO TABS: 150.0000 mg | ORAL_TABLET | Freq: Two times a day (BID) | ORAL | Status: DC

## 2020-02-11 MED ORDER — PANTOPRAZOLE SODIUM 40 MG PO TBEC
40.0000 mg | DELAYED_RELEASE_TABLET | Freq: Every day | ORAL | Status: DC
  Administered 2020-02-11 – 2020-02-15 (×5): 40 mg via ORAL
  Filled 2020-02-11 (×5): qty 1

## 2020-02-11 MED ORDER — CLONAZEPAM 0.25 MG PO TBDP
0.5000 mg | ORAL_TABLET | Freq: Three times a day (TID) | ORAL | Status: DC | PRN
  Administered 2020-02-11 – 2020-02-13 (×4): 0.5 mg via ORAL
  Filled 2020-02-11 (×3): qty 2
  Filled 2020-02-11: qty 1

## 2020-02-11 MED ORDER — MAGNESIUM SULFATE 2 GM/50ML IV SOLN
2.0000 g | Freq: Once | INTRAVENOUS | Status: AC
  Administered 2020-02-11: 2 g via INTRAVENOUS
  Filled 2020-02-11: qty 50

## 2020-02-11 MED ORDER — POLYVINYL ALCOHOL 1.4 % OP SOLN
1.0000 [drp] | OPHTHALMIC | Status: DC | PRN
  Filled 2020-02-11: qty 15

## 2020-02-11 NOTE — Plan of Care (Signed)
  Problem: Education: Goal: Knowledge of General Education information will improve Description: Including pain rating scale, medication(s)/side effects and non-pharmacologic comfort measures Outcome: Progressing   Problem: Health Behavior/Discharge Planning: Goal: Ability to manage health-related needs will improve Outcome: Progressing   Problem: Clinical Measurements: Goal: Ability to maintain clinical measurements within normal limits will improve Outcome: Progressing Goal: Will remain free from infection Outcome: Progressing Goal: Diagnostic test results will improve Outcome: Progressing Goal: Respiratory complications will improve Outcome: Progressing Goal: Cardiovascular complication will be avoided Outcome: Progressing   Problem: Activity: Goal: Risk for activity intolerance will decrease Outcome: Progressing   Problem: Nutrition: Goal: Adequate nutrition will be maintained Outcome: Progressing   Problem: Coping: Goal: Level of anxiety will decrease Outcome: Progressing   Problem: Elimination: Goal: Will not experience complications related to bowel motility Outcome: Progressing Goal: Will not experience complications related to urinary retention Outcome: Progressing   Problem: Pain Managment: Goal: General experience of comfort will improve Outcome: Progressing   Problem: Safety: Goal: Ability to remain free from injury will improve Outcome: Progressing   Problem: Skin Integrity: Goal: Risk for impaired skin integrity will decrease Outcome: Progressing   Problem: Cardiac: Goal: Ability to achieve and maintain adequate cardiopulmonary perfusion will improve Outcome: Progressing   Problem: Fluid Volume: Goal: Ability to achieve a balanced intake and output will improve Outcome: Progressing   Problem: Physical Regulation: Goal: Complications related to the disease process, condition or treatment will be avoided or minimized Outcome: Progressing    Problem: Respiratory: Goal: Will regain and/or maintain adequate ventilation Outcome: Progressing

## 2020-02-11 NOTE — Progress Notes (Signed)
Patient ID: Kristina Nichols, female   DOB: [birth year], 54 y.o.   MRN: [id number]     Advanced Heart Failure Rounding Note  PCP-Cardiologist: No primary care provider on file.   Subjective:    10/25 S/P IABP.  Temp pacer removed. 10/27 IABP removed.  10/28 Extubated. Placed back on Bipap for several hours. Given solumedrol and later weaned to 4 liters. Failed swallow study.  10/29 Passed swallow eval, Swan out.   No dyspnea this morning but anxious.  Some coughing.  Now off abx and afebrile.   Now on mexiletine, no VT.    Objective:   Weight Range: 76.2 kg Body mass index is 27.96 kg/m.   Vital Signs:   Temp:  [98.2 F (36.8 C)-98.6 F (37 C)] 98.6 F (37 C) (10/30 0400) Pulse Rate:  [64-86] 84 (10/30 0700) Resp:  [10-23] 15 (10/30 0700) BP: (92-130)/(53-86) 110/66 (10/30 0700) SpO2:  [97 %-100 %] 100 % (10/30 0700) Weight:  [76.2 kg] 76.2 kg (10/30 0500) Last BM Date:  (PTA)  Weight change: Filed Weights   02/09/20 0400 02/10/20 0600 02/11/20 0500  Weight: 73.4 kg 76.7 kg 76.2 kg    Intake/Output:   Intake/Output Summary (Last 24 hours) at 02/11/2020 0827 Last data filed at 02/11/2020 0600 Gross per 24 hour  Intake 438.38 ml  Output 1070 ml  Net -631.62 ml      Physical Exam   General: NAD Neck: No JVD, no thyromegaly or thyroid nodule.  Lungs: Clear to auscultation bilaterally with normal respiratory effort. CV: Nondisplaced PMI.  Heart regular S1/S2, no S3/S4, no murmur.  No peripheral edema.   Abdomen: Soft, nontender, no hepatosplenomegaly, no distention.  Skin: Intact without lesions or rashes.  Neurologic: Alert and oriented x 3.  Psych: Normal affect. Extremities: No clubbing or cyanosis.  HEENT: Normal.    Telemetry  SR with occasional PVCs, no VT (personally reviewed).   EKG    N/a   Labs    CBC Recent Labs    02/09/20 0306 02/10/20 0214  WBC 6.6 7.3  HGB 9.2* 9.9*  HCT 29.0* 30.9*  MCV 97.0 96.6  PLT 68* 101*   Basic  Metabolic Panel Recent Labs    58/85/02 0306 02/10/20 0214 02/10/20 0637  NA 137  --  137  K 4.2  --  4.4  CL 108  --  101  CO2 23  --  28  GLUCOSE 113*  --  132*  BUN 15  --  12  CREATININE 0.62  --  0.49  CALCIUM 8.2*  --  8.7*  MG 1.8 2.0  --    Liver Function Tests Recent Labs    02/10/20 0330  AST 46*  ALT 104*  ALKPHOS 134*  BILITOT 0.7  PROT 5.9*  ALBUMIN 2.8*   No results for input(s): LIPASE, AMYLASE in the last 72 hours. Cardiac Enzymes No results for input(s): CKTOTAL, CKMB, CKMBINDEX, TROPONINI in the last 72 hours.  BNP: BNP (last 3 results) No results for input(s): BNP in the last 8760 hours.  ProBNP (last 3 results) No results for input(s): PROBNP in the last 8760 hours.   D-Dimer No results for input(s): DDIMER in the last 72 hours. Hemoglobin A1C No results for input(s): HGBA1C in the last 72 hours. Fasting Lipid Panel No results for input(s): CHOL, HDL, LDLCALC, TRIG, CHOLHDL, LDLDIRECT in the last 72 hours. Thyroid Function Tests No results for input(s): TSH, T4TOTAL, T3FREE, THYROIDAB in the last 72 hours.  Invalid input(s):  FREET3  Other results:   Imaging    No results found.   Medications:     Scheduled Medications: . buPROPion  100 mg Oral TID  . carvedilol  3.125 mg Oral BID WC  . Chlorhexidine Gluconate Cloth  6 each Topical Q0600  . digoxin  0.125 mg Oral Daily  . docusate sodium  100 mg Oral BID  . enoxaparin (LOVENOX) injection  40 mg Subcutaneous Q24H  . lamoTRIgine  150 mg Oral BID  . losartan  12.5 mg Oral BID  . mouth rinse  15 mL Mouth Rinse BID  . mexiletine  150 mg Oral Q8H  . spironolactone  25 mg Oral Daily    Infusions: . sodium chloride Stopped (02/10/20 2015)    PRN Medications: acetaminophen, clonazepam, influenza vac split quadrivalent PF, levalbuterol, polyethylene glycol    Patient Profile  Kristina Nichols is a 54 year old with h/o witnessed cardiac arrest at homewith refractory vib/ torsades.  Intubated in ER. Taken for [facility] with clean coronaries, findings consistent with stress induced cardiomyopathy, LVEF <25%.   Cardiogenic shock/torsades. Multiple shocks.   Assessment/Plan   1. VT: Recurrent torsades with multiple shocks, also triggered by RV pacing from temporary wire (removed). Coronaries normal. Amiodarone stopped due to prolonged QT interval, on lidocaine now to mexiletine.  No further VT. I suspect that prolonged QT is the sequelae of stress (Takotsubo-type) cardiomyopathy.  - Continue mexiletine.  - Continue Coreg.  - Cardiac MRI to assess for infiltrative disease.  - ICD for secondary prevention, Dr. Lalla Brothers to discuss with her.   - Needs BMET today, send stat.  2. Acute systolic CHF/cardiogenic shock: EF <20% on echo (no LV thrombus), initial cath with normal coronaries. Concern for stress (Takotsubo-type) cardiomyopathy. Recent emotional stress. Initially required IABP and norepinephrine, now weaned off and stable.   - Continue digoxin.   - Continue Coreg 3.125 mg bid, no BP room to titrate today.   - Continue spironolactone 25 mg daily.  - Continue losartan 12.5 mg bid, eventually to Entresto if BP remains stable.    - Will need eventual cardiac MRI.  3. Neuro: Seems coherent but very anxious.   4. Acute hypoxemic respiratory failure: On vent for several days. CXR with bilateral patchy infiltrates, ?aspiration in setting of cardiac arrest. Now resolved, extubated, CXR has cleared.  Off abx.  5. Elevated LFTs: Suspect shock liver, trending down.  6. ID: Initially treated with abx for PNA, now off.   7. Thrombocytopenia: Suspect due to IABP, resolving.  Needs CBC today.   Length of Stay: 6  Kristina Ancona, MD  02/11/2020, 8:27 AM  Advanced Heart Failure Team Pager [phone] (M-F; 7a - 4p)  Please contact [facility] for night-coverage after hours (4p -7a ) and weekends on [email or web address]

## 2020-02-11 NOTE — Progress Notes (Signed)
PROGRESS NOTE                                                                                                                                                                                                             Patient Demographics:    Kristina Nichols, is a 54 y.o. female, DOB - [birth year], ZOX:[id number]  Outpatient Primary MD for the patient is Patient, No Pcp Per    LOS - 6  Admit date - 02/05/2020    Chief Complaint  Patient presents with   post cpr       Brief Narrative (HPI from H&P) 54 yo woman here after witnessed cardiac arrest at home with refractory vib/ torsades.  Intubated in ER.  Taken for [facility] with clean coronaries, findings consistent with stress induced cardiomyopathy, LVEF < 25%, she was briefly intubated for 4 days extubated on 02/09/2020, thereafter required brief BiPAP, she was stabilized and transferred to hospitalist service on 02/11/2020.   Subjective:    Kristina Nichols today has, No headache, No chest pain, No abdominal pain - No Nausea, No new weakness tingling or numbness, no SOB   Assessment  & Plan :     1. Cardiac arrest due to V. fib and torsades with acute systolic heart failure EF 25% due to most likely Takotsubo cardiomyopathy with no significant CAD on cath.  Causing acute hypoxic respiratory failure requiring intubation for 4 days, respiratory failure has resolved, she has been seen by cardiology and PCCM, underwent left and right heart cath, briefly also required intra-aortic balloon pump.  Cardiology following she required amiodarone drip and lidocaine drip, she is now on mexiletine, digoxin, Aldactone, ARB along with Coreg, likely will require AICD placement this discharge.  Cardiology and EP following.    2.  Acute ICU encephalopathy.  Gradually improving, no focal deficits, overall mildly anxious, also has underlying depression, CT head nonacute.  Continue supportive care.   Minimize benzos and narcotics.  3.  Dysphagia - on D2- NT, SLP following.  4.  Anxiety and depression.  Not suicidal homicidal, cautiously resume home medications if they are not QT prolonging.  We will stop Vyvanse per pharmacy as can prolong QT, resume Wellbutrin, Lamictal and as needed Klonopin.   5.  Normocytic normochromic anemia.  Likely due to frequent blood draws.  Monitor.     Condition -  Extremely Guarded  Family Communication  :  Husband bedside 02/11/20  Code Status :  Full  Consults  :  PCCM, Cards  Procedures  :    10/24 ETT >> 10/28 10/24 R femoral sheath >>10/25, temp pacer removed; IABP inserted >> 10/25 R IJ cordis with PA catheter >>   TTE - 1. No LV thrombus visualized, thought cannot excluded given LV substrate. Left ventricular ejection fraction, by estimation, is <20%. The left ventricle has severely decreased function. The left ventricle demonstrates global hypokinesis. Left ventricular diastolic parameters are consistent with Grade I diastolic dysfunction (impaired relaxation).  2. Right ventricular systolic function is normal. The right ventricular size is normal.  3. The mitral valve is grossly normal. No evidence of mitral valve regurgitation.  4. The aortic valve was not well visualized. Aortic valve regurgitation is trivial.  5. Aortic IABP in Descending Aorta.  6. The inferior vena cava is dilated in size with <50% respiratory variability, suggesting right atrial pressure of 15 mmHg.   L.Heart Cath - 1. Mildly elevated left and right heart filling pressures. 2. Low cardiac output by thermodilution on norepinephrine 4, preserved output by Fick. 3. Successful IABP placement. 4. Swan left in place right IJ  CT Head - non acute   Cardiac cath 10/24 >>  There is severe left ventricular systolic dysfunction.  LV end diastolic pressure is moderately elevated.  The left ventricular ejection fraction is less than 25% by visual estimate.  There is no  mitral valve regurgitation. 1. No angiographic evidence of CAD 2. Severe segmental LV systolic dysfunction with wall motion suggesting Takotsubo's cardiomyopathy (stress induced cardiomyopathy).  3. Elevated LVEDP 4. Ventricular fibrillation/Torsades-Temporary Pacemaker placed per request of admitting/consulting team in case of need of overdrive pacing tonight  10/25 RHC >> 1. Mildly elevated left and right heart filling pressures.  2. Low cardiac output by thermodilution on norepinephrine 4, preserved output by Fick.  3. Successful IABP placement.  4. Swan left in place right IJ.  RA mean 12 RV 31/13 PA 33/19, mean 24 PCWP mean 17 Oxygen saturations: PA 73% AO 100% Cardiac Output (Thermo) 3.53 Cardiac Index (Thermo) 1.95 PVR 2 WU Cardiac Output (Fick) 4.95 Cardiac Index (Fick) 2.74   PUD Prophylaxis :  PPI  Disposition Plan  :    Status is: Inpatient  Remains inpatient appropriate because:IV treatments appropriate due to intensity of illness or inability to take PO   Dispo: The patient is from: Home              Anticipated d/c is to: Home              Anticipated d/c date is: > 3 days              Patient currently is not medically stable to d/c.  DVT Prophylaxis  :  Lovenox   Lab Results  Component Value Date   PLT 101 (L) 02/10/2020    Diet :  Diet Order            DIET DYS 2 Room service appropriate? Yes with Assist; Fluid consistency: Nectar Thick  Diet effective now                  Inpatient Medications  Scheduled Meds:  buPROPion  100 mg Oral TID   carvedilol  3.125 mg Oral BID WC   Chlorhexidine Gluconate Cloth  6 each Topical Q0600   digoxin  0.125 mg Oral Daily   docusate sodium  100 mg Oral BID   enoxaparin (LOVENOX) injection  40 mg Subcutaneous Q24H   lamoTRIgine  150 mg Oral BID   losartan  12.5 mg Oral BID   mouth rinse  15 mL Mouth Rinse BID   mexiletine  150 mg Oral Q8H   spironolactone  25 mg Oral Daily    Continuous Infusions:  sodium chloride Stopped (02/10/20 2015)   PRN Meds:.acetaminophen, clonazepam, influenza vac split quadrivalent PF, levalbuterol, polyethylene glycol  Antibiotics  :    Anti-infectives (From admission, onward)   Start     Dose/Rate Route Frequency Ordered Stop   02/08/20 2100  vancomycin (VANCOCIN) IVPB 1000 mg/200 mL premix  Status:  Discontinued        1,000 mg 200 mL/hr over 60 Minutes Intravenous Every 12 hours 02/08/20 0754 02/09/20 0954   02/08/20 0945  Vancomycin (VANCOCIN) 1,500 mg in sodium chloride 0.9 % 500 mL IVPB  Status:  Discontinued        1,500 mg 250 mL/hr over 120 Minutes Intravenous  Once 02/08/20 0848 02/08/20 0848   02/08/20 0945  vancomycin (VANCOCIN) 1,500 mg in sodium chloride 0.9 % 250 mL IVPB  Status:  Discontinued        1,500 mg 125 mL/hr over 120 Minutes Intravenous  Once 02/08/20 0850 02/08/20 0851   02/08/20 0945  vancomycin (VANCOREADY) IVPB 1500 mg/300 mL        1,500 mg 150 mL/hr over 120 Minutes Intravenous  Once 02/08/20 0854 02/08/20 1142   02/08/20 0900  ceFEPIme (MAXIPIME) 2 g in sodium chloride 0.9 % 100 mL IVPB  Status:  Discontinued        2 g 200 mL/hr over 30 Minutes Intravenous Every 8 hours 02/08/20 0752 02/10/20 0939   02/08/20 0845  Vancomycin (VANCOCIN) 1,500 mg in sodium chloride 0.9 % 500 mL IVPB  Status:  Discontinued        1,500 mg 250 mL/hr over 120 Minutes Intravenous  Once 02/08/20 0752 02/08/20 0854       Time Spent in minutes  30   Susa Raring M.D on 02/11/2020 at 9:35 AM  To page go to [email or web address] - password [facility]  [facility] -  Office  [phone]    See all Orders from today for further details    Objective:   Vitals:   02/11/20 0600 02/11/20 0700 02/11/20 0800 02/11/20 0900  BP: 127/62 110/66 123/82 114/81  Pulse: 86 84 87 83  Resp: 14 15 16 13   Temp:      TempSrc:      SpO2: 100% 100% 100% 95%  Weight:      Height:        Wt Readings from Last 3  Encounters:  02/11/20 76.2 kg     Intake/Output Summary (Last 24 hours) at 02/11/2020 0935 Last data filed at 02/11/2020 0600 Gross per 24 hour  Intake 428.39 ml  Output 1070 ml  Net -641.61 ml     Physical Exam  Awake mildly confused and extremely anxious, No new F.N deficits,    [place].AT,PERRAL Supple Neck,No JVD, No cervical lymphadenopathy appriciated.  Symmetrical Chest wall movement, Good air movement bilaterally, CTAB RRR,No Gallops,Rubs or new Murmurs, No Parasternal Heave +ve B.Sounds, Abd Soft, No tenderness, No organomegaly appriciated, No rebound - guarding or rigidity. No Cyanosis, Clubbing or edema, No new Rash or bruise       Data Review:    CBC Recent Labs  Lab 02/07/20 0048 02/08/20 0306 02/08/20 0558 02/09/20 0306  02/10/20 0214  WBC 9.7 7.4 8.4 6.6 7.3  HGB 12.1 10.3* 9.9* 9.2* 9.9*  HCT 37.2 32.4* 31.2* 29.0* 30.9*  PLT 143* 71* 75* 68* 101*  MCV 96.1 97.9 97.2 97.0 96.6  MCH 31.3 31.1 30.8 30.8 30.9  MCHC 32.5 31.8 31.7 31.7 32.0  RDW 13.8 13.9 13.9 14.1 13.5    Recent Labs  Lab 02/05/20 1745 02/05/20 1806 02/05/20 1937 02/05/20 2107 02/05/20 2118 02/05/20 2225 02/06/20 0110 02/06/20 0214 02/06/20 1241 02/06/20 1543 02/07/20 0048 02/07/20 1442 02/08/20 0306 02/09/20 0306 02/10/20 0214 02/10/20 0330 02/10/20 0637  NA 139   < >  --    < >  --  137  --    < > 137   < > 136 136 137 137  --   --  137  K 3.9   < >  --    < >  --  4.9  --    < > 4.1   < > 3.7 4.2 3.9 4.2  --   --  4.4  CL 107   < >  --    < >  --  109  --    < > 106   < > 107 108 107 108  --   --  101  CO2 21*  --   --    < >  --  22  --    < > 24   < > 22 22 21* 23  --   --  28  GLUCOSE 182*   < >  --    < >  --  199*  --    < > 120*   < > 124* 142* 128* 113*  --   --  132*  BUN 18   < >  --    < >  --  16  --    < > 14   < > 16 12 10 15   --   --  12  CREATININE 1.18*   < >  --    < >  --  0.94  --    < > 0.76   < > 0.73 0.64 0.63 0.62  --   --  0.49  CALCIUM 8.7*  --    --    < >  --  7.0*  --    < > 8.2*   < > 8.0* 7.7* 7.9* 8.2*  --   --  8.7*  AST  --   --   --   --   --  898*  --   --   --   --   --  119*  --   --   --  46*  --   ALT  --   --   --   --   --  482*  --   --   --   --   --  210*  --   --   --  104*  --   ALKPHOS  --   --   --   --   --  75  --   --   --   --   --  67  --   --   --  134*  --   BILITOT  --   --   --   --   --  0.8  --   --   --   --   --  0.6  --   --   --  0.7  --   ALBUMIN  --   --   --   --   --  3.1*  --   --   --   --   --  2.8*  --   --   --  2.8*  --   MG  --    < >  --    < >  --   --   --    < >  --    < > 2.2 2.3 2.0 1.8 2.0  --   --   DDIMER  --   --  >20.00*  --   --   --   --   --   --   --   --   --   --   --   --   --   --   PROCALCITON  --   --   --   --   --   --   --   --   --   --   --  0.10 <0.10  --   --   --   --   LATICACIDVEN 3.8*  --  2.4*  --   --  1.3 1.9  --   --   --   --   --   --   --   --   --   --   INR 1.0  --   --   --   --   --   --   --   --   --   --   --   --   --   --   --   --   TSH  --   --   --   --  7.724*  --   --   --   --   --   --   --   --   --   --   --   --   HGBA1C  --   --   --   --   --   --   --   --  5.3  --   --   --   --   --   --   --   --    < > = values in this interval not displayed.    ------------------------------------------------------------------------------------------------------------------ No results for input(s): CHOL, HDL, LDLCALC, TRIG, CHOLHDL, LDLDIRECT in the last 72 hours.  Lab Results  Component Value Date   HGBA1C 5.3 02/06/2020   ------------------------------------------------------------------------------------------------------------------ No results for input(s): TSH, T4TOTAL, T3FREE, THYROIDAB in the last 72 hours.  Invalid input(s): FREET3  Cardiac Enzymes No results for input(s): CKMB, TROPONINI, MYOGLOBIN in the last 168 hours.  Invalid input(s):  CK ------------------------------------------------------------------------------------------------------------------ No results found for: BNP  Micro Results Recent Results (from the past 240 hour(s))  Resp Panel by RT PCR (RSV, Flu A&B, Covid) - Nasopharyngeal Swab     Status: None   Collection Time: 02/05/20  6:14 PM   Specimen: Nasopharyngeal Swab  Result Value Ref Range Status   SARS Coronavirus 2 by RT PCR NEGATIVE NEGATIVE Final    Comment: (NOTE) SARS-CoV-2 target nucleic acids are NOT DETECTED.  The SARS-CoV-2 RNA is generally detectable in upper respiratoy specimens during the acute phase of infection. The lowest concentration of SARS-CoV-2 viral copies this assay can detect is 131 copies/mL. A negative result does  not preclude SARS-Cov-2 infection and should not be used as the sole basis for treatment or other patient management decisions. A negative result may occur with  improper specimen collection/handling, submission of specimen other than nasopharyngeal swab, presence of viral mutation(s) within the areas targeted by this assay, and inadequate number of viral copies (<131 copies/mL). A negative result must be combined with clinical observations, patient history, and epidemiological information. The expected result is Negative.  Fact Sheet for Patients:  https://www.moore.com/  Fact Sheet for Healthcare Providers:  https://www.young.biz/  This test is no t yet approved or cleared by the [place] FDA and  has been authorized for detection and/or diagnosis of SARS-CoV-2 by FDA under an Emergency Use Authorization (EUA). This EUA will remain  in effect (meaning this test can be used) for the duration of the COVID-19 declaration under Section 564(b)(1) of the Act, 21 U.S.C. section 360bbb-3(b)(1), unless the authorization is terminated or revoked sooner.     Influenza A by PCR NEGATIVE NEGATIVE Final   Influenza B by  PCR NEGATIVE NEGATIVE Final    Comment: (NOTE) The Xpert Xpress SARS-CoV-2/FLU/RSV assay is intended as an aid in  the diagnosis of influenza from Nasopharyngeal swab specimens and  should not be used as a sole basis for treatment. Nasal washings and  aspirates are unacceptable for Xpert Xpress SARS-CoV-2/FLU/RSV  testing.  Fact Sheet for Patients: https://www.moore.com/  Fact Sheet for Healthcare Providers: https://www.young.biz/  This test is not yet approved or cleared by the [place] FDA and  has been authorized for detection and/or diagnosis of SARS-CoV-2 by  FDA under an Emergency Use Authorization (EUA). This EUA will remain  in effect (meaning this test can be used) for the duration of the  Covid-19 declaration under Section 564(b)(1) of the Act, 21  U.S.C. section 360bbb-3(b)(1), unless the authorization is  terminated or revoked.    Respiratory Syncytial Virus by PCR NEGATIVE NEGATIVE Final    Comment: (NOTE) Fact Sheet for Patients: https://www.moore.com/  Fact Sheet for Healthcare Providers: https://www.young.biz/  This test is not yet approved or cleared by the [place] FDA and  has been authorized for detection and/or diagnosis of SARS-CoV-2 by  FDA under an Emergency Use Authorization (EUA). This EUA will remain  in effect (meaning this test can be used) for the duration of the  COVID-19 declaration under Section 564(b)(1) of the Act, 21 U.S.C.  section 360bbb-3(b)(1), unless the authorization is terminated or  revoked. Performed at [facility], [address], [place], [place] [address]   MRSA PCR Screening     Status: None   Collection Time: 02/05/20 10:25 PM   Specimen: Nasopharyngeal  Result Value Ref Range Status   MRSA by PCR NEGATIVE NEGATIVE Final    Comment:        The GeneXpert MRSA Assay (FDA approved for NASAL specimens only), is one component of  a comprehensive MRSA colonization surveillance program. It is not intended to diagnose MRSA infection nor to guide or monitor treatment for MRSA infections. Performed at [facility], [address], [place], [place] [address]   Culture, blood (routine x 2)     Status: None (Preliminary result)   Collection Time: 02/07/20  8:21 AM   Specimen: BLOOD LEFT ARM  Result Value Ref Range Status   Specimen Description BLOOD LEFT ARM  Final   Special Requests   Final    BOTTLES DRAWN AEROBIC AND ANAEROBIC Blood Culture adequate volume   Culture  Final    NO GROWTH 3 DAYS Performed at [facility], [address], [place], [place] [address]    Report Status PENDING  Incomplete  Culture, blood (routine x 2)     Status: None (Preliminary result)   Collection Time: 02/07/20  8:34 AM   Specimen: BLOOD  Result Value Ref Range Status   Specimen Description BLOOD LEFT ANTECUBITAL  Final   Special Requests   Final    BOTTLES DRAWN AEROBIC AND ANAEROBIC Blood Culture adequate volume   Culture   Final    NO GROWTH 3 DAYS Performed at [facility], [address] [place], [place] [address]    Report Status PENDING  Incomplete  Culture, Urine     Status: None   Collection Time: 02/07/20  2:42 PM   Specimen: Urine, Catheterized  Result Value Ref Range Status   Specimen Description URINE, CATHETERIZED  Final   Special Requests NONE  Final   Culture   Final    NO GROWTH Performed at [facility], [address] [place], [place] [address]    Report Status 02/08/2020 FINAL  Final    Radiology Reports CT Head Wo Contrast  Result Date: 02/05/2020 CLINICAL DATA:  54 year old female with altered mental status post cardiac arrest. EXAM: CT HEAD WITHOUT CONTRAST TECHNIQUE: Contiguous axial images were obtained from the base of the skull through the vertex without intravenous contrast. COMPARISON:  None. FINDINGS: Brain: No evidence of acute infarction, hemorrhage,  hydrocephalus, extra-axial collection or mass lesion/mass effect. Vascular: No hyperdense vessel or unexpected calcification. Skull: Normal. Negative for fracture or focal lesion. Sinuses/Orbits: No acute finding. Other: Oral intubation noted. IMPRESSION: No evidence of acute intracranial abnormality. Electronically Signed   By: Harmon Pier M.D.   On: 02/05/2020 18:48   CARDIAC CATHETERIZATION  Result Date: 02/06/2020 1. Mildly elevated left and right heart filling pressures. 2. Low cardiac output by thermodilution on norepinephrine 4, preserved output by Fick. 3. Successful IABP placement. 4. Swan left in place right IJ.   CARDIAC CATHETERIZATION  Result Date: 02/05/2020  There is severe left ventricular systolic dysfunction.  LV end diastolic pressure is moderately elevated.  The left ventricular ejection fraction is less than 25% by visual estimate.  There is no mitral valve regurgitation.  1. No angiographic evidence of CAD 2. Severe segmental LV systolic dysfunction with wall motion suggesting Takotsubo's cardiomyopathy (stress induced cardiomyopathy). 3. Elevated LVEDP 4. Ventricular fibrillation/Torsades-Temporary Pacemaker placed per request of admitting/consulting team in case of need of overdrive pacing tonight Recommendations: No further ischemic workup. Management of VF/Torsades per EP team in the am. Medical management of likely stress induced cardiomyopathy.   DG CHEST PORT 1 VIEW  Result Date: 02/08/2020 CLINICAL DATA:  Acute systolic heart failure. EXAM: PORTABLE CHEST 1 VIEW COMPARISON:  February 07, 2020. FINDINGS: The heart size and mediastinal contours are within normal limits. Endotracheal and nasogastric tubes are unchanged in position. Right internal jugular Swan-Ganz catheter is unchanged. No pneumothorax or pleural effusion is noted. Left lung is clear. Right upper lobe airspace opacity is nearly resolved. The visualized skeletal structures are unremarkable. IMPRESSION:  Stable support apparatus. Right upper lobe airspace opacity is nearly resolved. Electronically Signed   By: Lupita Raider M.D.   On: 02/08/2020 08:36   DG CHEST PORT 1 VIEW  Result Date: 02/07/2020 CLINICAL DATA:  54 year old female with heart failure. EXAM: PORTABLE CHEST 1 VIEW COMPARISON:  Earlier radiograph dated 02/07/2020. FINDINGS: Endotracheal tube, enteric  tube, and Swan-Ganz in similar position. There has been interval development of an area of opacity in the right upper lung field with overall decreased volume consistent with lobar atelectasis or collapse. Clinical correlation is recommended. No large pleural effusion. No pneumothorax. Stable cardiac silhouette. No acute osseous pathology. IMPRESSION: Interval development of lobar atelectasis or collapse of the right upper lung field. Follow-up recommended. Electronically Signed   By: Elgie Collard M.D.   On: 02/07/2020 18:20   DG Chest Port 1 View  Result Date: 02/07/2020 CLINICAL DATA:  Post cardiac arrest. EXAM: PORTABLE CHEST 1 VIEW COMPARISON:  02/05/2020 FINDINGS: Endotracheal tube in good position. NG tube enters the stomach with the tip not visualized. Involve placement of right jugular Swan-Ganz catheter. Swan-Ganz catheter tip right lower lobe pulmonary artery. No pneumothorax. Improvement in bilateral infiltrates compared with the prior study. No effusion. IMPRESSION: Swan-Ganz catheter tip right lower lobe pulmonary artery. No pneumothorax. Improvement in bilateral airspace disease likely edema. Electronically Signed   By: Marlan Palau M.D.   On: 02/07/2020 08:35   DG Chest Port 1 View  Result Date: 02/05/2020 CLINICAL DATA:  Status post CPR. EXAM: PORTABLE CHEST 1 VIEW COMPARISON:  February 05, 2020 (6:03 p.m.) FINDINGS: There is stable endotracheal tube and nasogastric tube positioning. Mild diffusely increased interstitial lung markings are seen with moderate to marked severity patchy left suprahilar and right upper  lobe infiltrates. Mild bilateral lower lobe infiltrates are also seen. These areas are mildly increased in severity when compared to the prior study. The heart size and mediastinal contours are within normal limits. The visualized skeletal structures are unremarkable. IMPRESSION: Moderate to marked severity bilateral patchy infiltrates, as described above, with mild interval increase in severity when compared to the prior exam. Electronically Signed   By: Aram Candela M.D.   On: 02/05/2020 20:25   DG Chest Portable 1 View  Result Date: 02/05/2020 CLINICAL DATA:  Post intubation, witnessed cardiac arrest and CPR EXAM: PORTABLE CHEST 1 VIEW COMPARISON:  None FINDINGS: Endotracheal tube terminates between the clavicles approximately 4 cm above the carina. Gastric tube courses through in a off the field of the radiograph, side port in the region of the mid stomach, tip off the field of view. Pacer defibrillator pads project over the patient. Upper lobe opacities are present with some elevation of the minor fissure in the RIGHT chest. Less opacity at the LEFT lung apex. On limited assessment no acute skeletal process. IMPRESSION: 1. Endotracheal tube terminates between the clavicles approximately 4 cm above the carina. 2. Suggested volume loss and consolidative changes in the RIGHT upper lobe greater than LEFT upper lobe. Findings could be related to volume loss with pneumonitis from aspiration and potential background of asymmetric pulmonary edema or pneumonia. Underlying mass would be difficult to exclude. Follow-up after resolution of acute symptoms is suggested to exclude underlying lesion. 3. Gastric tube courses through a off the field of the radiograph, side port in the region of the mid stomach, tip off the field of view. Electronically Signed   By: Donzetta Kohut M.D.   On: 02/05/2020 18:20   ECHOCARDIOGRAM COMPLETE  Result Date: 02/06/2020    ECHOCARDIOGRAM REPORT   Patient Name:   Kristina Nichols  Date of Exam: 02/06/2020 Medical Rec #:  [id number]    Height:       65.0 in Accession #:    [id number]   Weight:       161.8 lb Date of Birth:  May 22, 1965   BSA:  1.808 m Patient Age:    53 years     BP:           136/80 mmHg Patient Gender: F            HR:           64 bpm. Exam Location:  Inpatient Procedure: 2D Echo Indications:    I46.9 cardiac arrest  History:        Patient has no prior history of Echocardiogram examinations.                 Risk Factors:Dyslipidemia. Temporary pacemaker.  Sonographer:    Celene Skeen RDCS (AE) Referring Phys: [id number] [facility] D [facility]  Sonographer Comments: Suboptimal apical window and echo performed with patient supine and on artificial respirator. restricted mobility IMPRESSIONS  1. No LV thrombus visualized, thought cannot excluded given LV substrate. Left ventricular ejection fraction, by estimation, is <20%. The left ventricle has severely decreased function. The left ventricle demonstrates global hypokinesis. Left ventricular diastolic parameters are consistent with Grade I diastolic dysfunction (impaired relaxation).  2. Right ventricular systolic function is normal. The right ventricular size is normal.  3. The mitral valve is grossly normal. No evidence of mitral valve regurgitation.  4. The aortic valve was not well visualized. Aortic valve regurgitation is trivial.  5. Aortic IABP in Descending Aorta.  6. The inferior vena cava is dilated in size with <50% respiratory variability, suggesting right atrial pressure of 15 mmHg. Comparison(s): No prior Echocardiogram. Conclusion(s)/Recommendation(s): Severe LV Dysfunction; primary team aware. FINDINGS  Left Ventricle: No LV thrombus visualized, thought cannot excluded given LV substrate. Left ventricular ejection fraction, by estimation, is <20%. The left ventricle has severely decreased function. The left ventricle demonstrates global hypokinesis. The left ventricular internal cavity size was normal in  size. There is no left ventricular hypertrophy. Left ventricular diastolic parameters are consistent with Grade I diastolic dysfunction (impaired relaxation). Right Ventricle: The right ventricular size is normal. No increase in right ventricular wall thickness. Right ventricular systolic function is normal. Left Atrium: Left atrial size was normal in size. Right Atrium: Right atrial size was normal in size. Pericardium: There is no evidence of pericardial effusion. Mitral Valve: The mitral valve is grossly normal. No evidence of mitral valve regurgitation. Tricuspid Valve: The tricuspid valve is not well visualized. Tricuspid valve regurgitation is not demonstrated. Aortic Valve: The aortic valve was not well visualized. Aortic valve regurgitation is trivial. Pulmonic Valve: The pulmonic valve was not well visualized. Pulmonic valve regurgitation is not visualized. Aorta: IABP in Descending Aorta and the aortic root is normal in size and structure. Venous: The pulmonary veins were not well visualized. The inferior vena cava is dilated in size with less than 50% respiratory variability, suggesting right atrial pressure of 15 mmHg. IAS/Shunts: The atrial septum is grossly normal.  LEFT VENTRICLE PLAX 2D LVIDd:         4.40 cm  Diastology LVIDs:         3.60 cm  LV e' medial:    5.98 cm/s LV PW:         0.80 cm  LV E/e' medial:  11.3 LV IVS:        0.70 cm  LV e' lateral:   6.53 cm/s LVOT diam:     2.20 cm  LV E/e' lateral: 10.4 LV SV:         54 LV SV Index:   30 LVOT Area:     3.80 cm  RIGHT VENTRICLE RV S prime:     10.00 cm/s TAPSE (M-mode): 2.0 cm LEFT ATRIUM         Index LA diam:    2.80 cm 1.55 cm/m  AORTIC VALVE LVOT Vmax:   72.50 cm/s LVOT Vmean:  59.300 cm/s LVOT VTI:    0.141 m  AORTA Ao Root diam: 2.80 cm MITRAL VALVE MV Area (PHT): 3.72 cm    SHUNTS MV Decel Time: 204 msec    Systemic VTI:  0.14 m MV E velocity: 67.70 cm/s  Systemic Diam: 2.20 cm MV A velocity: 63.80 cm/s MV E/A ratio:  1.06 Riley Lam MD Electronically signed by Riley Lam MD Signature Date/Time: 02/06/2020/6:04:10 PM    Final    VAS [place] LOWER EXTREMITY VENOUS (DVT)  Result Date: 02/07/2020  Lower Venous DVTStudy Indications: Elevated ddimer.  Limitations: Line and bandages. Comparison Study: no prior Performing Technologist: Blanch Media RVS  Examination Guidelines: A complete evaluation includes B-mode imaging, spectral Doppler, color Doppler, and power Doppler as needed of all accessible portions of each vessel. Bilateral testing is considered an integral part of a complete examination. Limited examinations for reoccurring indications may be performed as noted. The reflux portion of the exam is performed with the patient in reverse Trendelenburg.  +---------+---------------+---------+-----------+----------+-------------------+  RIGHT     Compressibility Phasicity Spontaneity Properties Thrombus Aging       +---------+---------------+---------+-----------+----------+-------------------+  CFV                                                        not visualized due                                                               to line              +---------+---------------+---------+-----------+----------+-------------------+  SFJ                                                        not visualized due                                                               to line              +---------+---------------+---------+-----------+----------+-------------------+  FV Prox   Full                                                                  +---------+---------------+---------+-----------+----------+-------------------+  FV Mid    Full                                                                  +---------+---------------+---------+-----------+----------+-------------------+  FV Distal Full                                                                   +---------+---------------+---------+-----------+----------+-------------------+  PFV       Full                                                                  +---------+---------------+---------+-----------+----------+-------------------+  POP       Full            Yes       Yes                                         +---------+---------------+---------+-----------+----------+-------------------+  PTV       Full                                                                  +---------+---------------+---------+-----------+----------+-------------------+  PERO      Full                                                                  +---------+---------------+---------+-----------+----------+-------------------+   +---------+---------------+---------+-----------+----------+--------------+  LEFT      Compressibility Phasicity Spontaneity Properties Thrombus Aging  +---------+---------------+---------+-----------+----------+--------------+  CFV       Full            Yes       Yes                                    +---------+---------------+---------+-----------+----------+--------------+  SFJ       Full                                                             +---------+---------------+---------+-----------+----------+--------------+  FV Prox   Full                                                             +---------+---------------+---------+-----------+----------+--------------+  FV Mid    Full                                                             +---------+---------------+---------+-----------+----------+--------------+  FV Distal Full                                                             +---------+---------------+---------+-----------+----------+--------------+  PFV       Full                                                             +---------+---------------+---------+-----------+----------+--------------+  POP       Full            Yes       Yes                                     +---------+---------------+---------+-----------+----------+--------------+  PTV       Full                                                             +---------+---------------+---------+-----------+----------+--------------+  PERO      Full                                                             +---------+---------------+---------+-----------+----------+--------------+     *See table(s) above for measurements and observations. Electronically signed by Waverly Ferrari MD on 02/07/2020 at 5:38:04 PM.    Final    [place] EKG SITE RITE  Result Date: 02/06/2020 If Site Rite image not attached, placement could not be confirmed due to current cardiac rhythm.

## 2020-02-11 NOTE — Progress Notes (Signed)
Progress Note  Patient Name: Kristina Nichols Date of Encounter: 02/11/2020  [facility] Cardiologist: Lalla Brothers Subjective   Doing well. Up to commode this morning. Husband at bedside.  Inpatient Medications    Scheduled Meds: . buPROPion  100 mg Oral TID  . carvedilol  3.125 mg Oral BID WC  . Chlorhexidine Gluconate Cloth  6 each Topical Q0600  . digoxin  0.125 mg Oral Daily  . docusate sodium  100 mg Oral BID  . enoxaparin (LOVENOX) injection  40 mg Subcutaneous Q24H  . lamoTRIgine  150 mg Oral BID  . losartan  12.5 mg Oral BID  . mouth rinse  15 mL Mouth Rinse BID  . mexiletine  150 mg Oral Q8H  . spironolactone  25 mg Oral Daily   Continuous Infusions: . sodium chloride Stopped (02/10/20 2015)   PRN Meds: acetaminophen, clonazepam, influenza vac split quadrivalent PF, levalbuterol, polyethylene glycol   Vital Signs    Vitals:   02/11/20 0600 02/11/20 0700 02/11/20 0800 02/11/20 0900  BP: 127/62 110/66 123/82 114/81  Pulse: 86 84 87 83  Resp: 14 15 16 13   Temp:      TempSrc:      SpO2: 100% 100% 100% 95%  Weight:      Height:        Intake/Output Summary (Last 24 hours) at 02/11/2020 0929 Last data filed at 02/11/2020 0600 Gross per 24 hour  Intake 428.39 ml  Output 1070 ml  Net -641.61 ml   Last 3 Weights 02/11/2020 02/10/2020 02/09/2020  Weight (lbs) 167 lb 15.9 oz 169 lb 1.5 oz 161 lb 13.1 oz  Weight (kg) 76.2 kg 76.7 kg 73.4 kg      Telemetry    SR 80's generally, rare PVCs, last VT (TdP) was nonsustained episode about 1330 on 10/25 - Personally Reviewed  ECG      Physical Exam   GEN: AAO Neck: no JVP Cardiac: RRR, no murmurs, rubs, or gallops.  Respiratory: clear/intubated GI: Soft, nontender, non-distended  MS: No edema; No deformity. Neuro:  no focal deficit Psych: confused at times.    Labs    High Sensitivity Troponin:   Recent Labs  Lab 02/05/20 1745 02/05/20 1929 02/05/20 2107 02/05/20 2225  TROPONINIHS 33* 88*  119* 232*      Chemistry Recent Labs  Lab 02/05/20 2225 02/06/20 0214 02/07/20 1442 02/07/20 1442 02/08/20 0306 02/09/20 0306 02/10/20 0330 02/10/20 0637  NA 137   < > 136   < > 137 137  --  137  K 4.9   < > 4.2   < > 3.9 4.2  --  4.4  CL 109   < > 108   < > 107 108  --  101  CO2 22   < > 22   < > 21* 23  --  28  GLUCOSE 199*   < > 142*   < > 128* 113*  --  132*  BUN 16   < > 12   < > 10 15  --  12  CREATININE 0.94   < > 0.64   < > 0.63 0.62  --  0.49  CALCIUM 7.0*   < > 7.7*   < > 7.9* 8.2*  --  8.7*  PROT 5.2*  --  4.9*  --   --   --  5.9*  --   ALBUMIN 3.1*  --  2.8*  --   --   --  2.8*  --  AST 898*  --  119*  --   --   --  46*  --   ALT 482*  --  210*  --   --   --  104*  --   ALKPHOS 75  --  67  --   --   --  134*  --   BILITOT 0.8  --  0.6  --   --   --  0.7  --   GFRNONAA >60   < > >60   < > >60 >60  --  >60  ANIONGAP 6   < > 6   < > 9 6  --  8   < > = values in this interval not displayed.     Hematology Recent Labs  Lab 02/08/20 0558 02/09/20 0306 02/10/20 0214  WBC 8.4 6.6 7.3  RBC 3.21* 2.99* 3.20*  HGB 9.9* 9.2* 9.9*  HCT 31.2* 29.0* 30.9*  MCV 97.2 97.0 96.6  MCH 30.8 30.8 30.9  MCHC 31.7 31.7 32.0  RDW 13.9 14.1 13.5  PLT 75* 68* 101*    BNPNo results for input(s): BNP, PROBNP in the last 168 hours.   DDimer  Recent Labs  Lab 02/05/20 1937  DDIMER >20.00*     Radiology    CT Head Wo Contrast  Result Date: 02/05/2020 CLINICAL DATA:  54 year old female with altered mental status post cardiac arrest. EXAM: CT HEAD WITHOUT CONTRAST TECHNIQUE: Contiguous axial images were obtained from the base of the skull through the vertex without intravenous contrast. COMPARISON:  None. FINDINGS: Brain: No evidence of acute infarction, hemorrhage, hydrocephalus, extra-axial collection or mass lesion/mass effect. Vascular: No hyperdense vessel or unexpected calcification. Skull: Normal. Negative for fracture or focal lesion. Sinuses/Orbits: No acute  finding. Other: Oral intubation noted. IMPRESSION: No evidence of acute intracranial abnormality. Electronically Signed   By: Harmon Pier M.D.   On: 02/05/2020 18:48   DG Chest Port 1 View Result Date: 02/05/2020 CLINICAL DATA:  Status post CPR. EXAM: PORTABLE CHEST 1 VIEW COMPARISON:  February 05, 2020 (6:03 p.m.) FINDINGS: There is stable endotracheal tube and nasogastric tube positioning. Mild diffusely increased interstitial lung markings are seen with moderate to marked severity patchy left suprahilar and right upper lobe infiltrates. Mild bilateral lower lobe infiltrates are also seen. These areas are mildly increased in severity when compared to the prior study. The heart size and mediastinal contours are within normal limits. The visualized skeletal structures are unremarkable. IMPRESSION: Moderate to marked severity bilateral patchy infiltrates, as described above, with mild interval increase in severity when compared to the prior exam. Electronically Signed   By: Aram Candela M.D.   On: 02/05/2020 20:25   DG Chest Portable 1 View  Result Date: 02/05/2020 CLINICAL DATA:  Post intubation, witnessed cardiac arrest and CPR EXAM: PORTABLE CHEST 1 VIEW COMPARISON:  None FINDINGS: Endotracheal tube terminates between the clavicles approximately 4 cm above the carina. Gastric tube courses through in a off the field of the radiograph, side port in the region of the mid stomach, tip off the field of view. Pacer defibrillator pads project over the patient. Upper lobe opacities are present with some elevation of the minor fissure in the RIGHT chest. Less opacity at the LEFT lung apex. On limited assessment no acute skeletal process. IMPRESSION: 1. Endotracheal tube terminates between the clavicles approximately 4 cm above the carina. 2. Suggested volume loss and consolidative changes in the RIGHT upper lobe greater than LEFT upper lobe. Findings could be  related to volume loss with pneumonitis from  aspiration and potential background of asymmetric pulmonary edema or pneumonia. Underlying mass would be difficult to exclude. Follow-up after resolution of acute symptoms is suggested to exclude underlying lesion. 3. Gastric tube courses through a off the field of the radiograph, side port in the region of the mid stomach, tip off the field of view. Electronically Signed   By: Donzetta Kohut M.D.   On: 02/05/2020 18:20    Cardiac Studies    02/06/2020: TTE IMPRESSIONS  1. No LV thrombus visualized, thought cannot excluded given LV substrate.  Left ventricular ejection fraction, by estimation, is <20%. The left  ventricle has severely decreased function. The left ventricle demonstrates  global hypokinesis. Left  ventricular diastolic parameters are consistent with Grade I diastolic  dysfunction (impaired relaxation).  2. Right ventricular systolic function is normal. The right ventricular  size is normal.  3. The mitral valve is grossly normal. No evidence of mitral valve  regurgitation.  4. The aortic valve was not well visualized. Aortic valve regurgitation  is trivial.  5. Aortic IABP in Descending Aorta.  6. The inferior vena cava is dilated in size with <50% respiratory  variability, suggesting right atrial pressure of 15 mmHg.   Comparison(s): No prior Echocardiogram.    02/06/2020: RHC, IABP insertion 1. Mildly elevated left and right heart filling pressures.  2. Low cardiac output by thermodilution on norepinephrine 4, preserved output by Fick.  3. Successful IABP placement.  4. Swan left in place right IJ.     02/06/2020: LHC  There is severe left ventricular systolic dysfunction.  LV end diastolic pressure is moderately elevated.  The left ventricular ejection fraction is less than 25% by visual estimate.  There is no mitral valve regurgitation.  1. No angiographic evidence of CAD 2. Severe segmental LV systolic dysfunction with wall motion suggesting  Takotsubo's cardiomyopathy (stress induced cardiomyopathy).  3. Elevated LVEDP 4. Ventricular fibrillation/Torsades-Temporary Pacemaker placed per request of admitting/consulting team in case of need of overdrive pacing tonight  Recommendations: No further ischemic workup. Management of VF/Torsades per EP team in the am. Medical management of likely stress induced cardiomyopathy.     Patient Profile     54 y.o. female with a hx of depression/anxiety, hypothyroidism (off tx), GERD, chronic granulomatosis carrier (?), no noted past cardiac history admitted with VF arrest  She has had recurrent TdP requiring multiple defibrillations  Assessment & Plan    1. Torsades 2. Cardiogenic shock  Electrically quiescent.  Recommendations: 1. Continue Mexiletine 150 mg by mouth 3 times daily.If patient has breakthrough polymorphic VT on mexiletine, would load with amiodarone. 2.  Continued hemodynamic optimization for heart failure team 3.  Patient is a candidate for secondary prevention ICD.  Recommend this be placed prior to discharge. I have discussed this at length with the patient and her husband. Will follow along for appropriate timing of implant.   For questions or updates, please contact [facility] Please consult [email or web address] for contact info under     Signed, Lanier Prude, MD  02/11/2020, 9:29 AM

## 2020-02-12 LAB — CULTURE, BLOOD (ROUTINE X 2)
Culture: NO GROWTH
Culture: NO GROWTH
Special Requests: ADEQUATE
Special Requests: ADEQUATE

## 2020-02-12 LAB — COMPREHENSIVE METABOLIC PANEL
ALT: 86 U/L — ABNORMAL HIGH (ref 0–44)
AST: 39 U/L (ref 15–41)
Albumin: 3.1 g/dL — ABNORMAL LOW (ref 3.5–5.0)
Alkaline Phosphatase: 124 U/L (ref 38–126)
Anion gap: 8 (ref 5–15)
BUN: 14 mg/dL (ref 6–20)
CO2: 30 mmol/L (ref 22–32)
Calcium: 9.1 mg/dL (ref 8.9–10.3)
Chloride: 99 mmol/L (ref 98–111)
Creatinine, Ser: 0.52 mg/dL (ref 0.44–1.00)
GFR, Estimated: >60 mL/min (ref >60)
Glucose, Bld: 131 mg/dL — ABNORMAL HIGH (ref 70–99)
Potassium: 3.8 mmol/L (ref 3.5–5.1)
Sodium: 137 mmol/L (ref 135–145)
Total Bilirubin: 0.9 mg/dL (ref 0.3–1.2)
Total Protein: 6.1 g/dL — ABNORMAL LOW (ref 6.5–8.1)

## 2020-02-12 LAB — CBC
HCT: 34.8 % — ABNORMAL LOW (ref 36.0–46.0)
Hemoglobin: 11 g/dL — ABNORMAL LOW (ref 12.0–15.0)
MCH: 30.2 pg (ref 26.0–34.0)
MCHC: 31.6 g/dL (ref 30.0–36.0)
MCV: 95.6 fL (ref 80.0–100.0)
Platelets: 216 10*3/uL (ref 150–400)
RBC: 3.64 MIL/uL — ABNORMAL LOW (ref 3.87–5.11)
RDW: 13.1 % (ref 11.5–15.5)
WBC: 6.9 10*3/uL (ref 4.0–10.5)
nRBC: 0 % (ref 0.0–0.2)

## 2020-02-12 LAB — MAGNESIUM: Magnesium: 1.9 mg/dL (ref 1.7–2.4)

## 2020-02-12 MED ORDER — MAGNESIUM SULFATE IN D5W 1-5 GM/100ML-% IV SOLN
1.0000 g | Freq: Once | INTRAVENOUS | Status: AC
  Administered 2020-02-12: 1 g via INTRAVENOUS
  Filled 2020-02-12: qty 100

## 2020-02-12 MED ORDER — POTASSIUM CHLORIDE CRYS ER 20 MEQ PO TBCR
40.0000 meq | EXTENDED_RELEASE_TABLET | Freq: Once | ORAL | Status: AC
  Administered 2020-02-12: 40 meq via ORAL
  Filled 2020-02-12: qty 2

## 2020-02-12 MED ORDER — SACUBITRIL-VALSARTAN 24-26 MG PO TABS
1.0000 | ORAL_TABLET | Freq: Two times a day (BID) | ORAL | Status: DC
  Administered 2020-02-12 – 2020-02-15 (×6): 1 via ORAL
  Filled 2020-02-12 (×6): qty 1

## 2020-02-12 NOTE — Progress Notes (Signed)
Physical Therapy Treatment Patient Details Name: Kristina Nichols MRN: [id number] DOB: 04/08/1966 Today's Date: 02/12/2020    History of Present Illness Pt is a 54 year old woman admitted on 02/05/20 with stress induced cardiomyopathy resulting in recurrent v-fib and torsades arrests. Intubated in ED, extubated 02/09/20. IABP 10/25-10/27. PMH: anxiety, depression, hypothyroidism, HLD, gastroparesis.    PT Comments    Pt presents to PT with decreased attention, which caused gait variability and poor standing balance. Pt was able to perform functional activities with minimal guarding for safety due to decreased attention. Plan to continue progressing pt with endurance during gait as tolerance increases.  Pre-gait: 121/83 Post-gait: 127/82, HR: 91-114, SpO2:92-98% RA   Follow Up Recommendations  Home health PT;Supervision/Assistance - 24 hour     Equipment Recommendations  Rolling walker with 5" wheels;3in1 (PT)    Recommendations for Other Services       Precautions / Restrictions Precautions Precautions: Fall    Mobility  Bed Mobility Overal bed mobility: Needs Assistance Bed Mobility: Supine to Sit     Supine to sit: Supervision     General bed mobility comments: supervision for safety  Transfers Overall transfer level: Needs assistance   Transfers: Sit to/from Stand Sit to Stand: Min guard         General transfer comment: guarding for safety and lines  Ambulation/Gait Ambulation/Gait assistance: Min assist Gait Distance (Feet): 300 Feet Assistive device: None Gait Pattern/deviations: Step-through pattern;Decreased stride length;Narrow base of support   Gait velocity interpretation: 1.31 - 2.62 ft/sec, indicative of limited community ambulator General Gait Details: pt with staggering and scissoring at times with assist for balance, cues for direction   Stairs             Wheelchair Mobility    Modified Rankin (Stroke Patients Only)        Balance Overall balance assessment: Needs assistance   Sitting balance-Leahy Scale: Good     Standing balance support: Single extremity supported Standing balance-Leahy Scale: Poor Standing balance comment: pt able to stand statically at toilet without UE support, UE support for gait                            Cognition Arousal/Alertness: Awake/alert Behavior During Therapy: WFL for tasks assessed/performed Overall Cognitive Status: Impaired/Different from baseline Area of Impairment: Attention;Memory;Following commands;Awareness                   Current Attention Level: Focused Memory: Decreased short-term memory Following Commands: Follows one step commands with increased time   Awareness: Intellectual Problem Solving: Slow processing;Decreased initiation;Difficulty sequencing;Requires verbal cues;Requires tactile cues General Comments: verbose, tangential, pt aware of need to void and able to perform self care      Exercises      General Comments        Pertinent Vitals/Pain Pain Assessment: No/denies pain    Home Living               Home Equipment: None      Prior Function            PT Goals (current goals can now be found in the care plan section) Progress towards PT goals: Progressing toward goals    Frequency    Min 3X/week      PT Plan      Co-evaluation              AM-PAC PT "6 Clicks" Mobility  Outcome Measure  Help needed turning from your back to your side while in a flat bed without using bedrails?: A Little Help needed moving from lying on your back to sitting on the side of a flat bed without using bedrails?: A Little Help needed moving to and from a bed to a chair (including a wheelchair)?: A Little Help needed standing up from a chair using your arms (e.g., wheelchair or bedside chair)?: A Little Help needed to walk in hospital room?: A Little Help needed climbing 3-5 steps with a railing? : A  Little 6 Click Score: 18    End of Session Equipment Utilized During Treatment: Gait belt Activity Tolerance: Patient tolerated treatment well Patient left: in chair;with chair alarm set;with family/visitor present Nurse Communication: Mobility status PT Visit Diagnosis: Muscle weakness (generalized) (M62.81);Other abnormalities of gait and mobility (R26.89)     Time: 2993-7169 PT Time Calculation (min) (ACUTE ONLY): 30 min  Charges:  $Gait Training: 8-22 mins $Therapeutic Activity: 8-22 mins                     [place], SPT [id number]   Zymere Patlan 02/12/2020, 12:27 PM

## 2020-02-12 NOTE — Progress Notes (Signed)
Patient ID: Kristina Nichols, female   DOB: [birth year], 54 y.o.   MRN: [id number]     Advanced Heart Failure Rounding Note  PCP-Cardiologist: No primary care provider on file.   Subjective:    10/25 S/P IABP.  Temp pacer removed. 10/27 IABP removed.  10/28 Extubated. Placed back on Bipap for several hours. Given solumedrol and later weaned to 4 liters. Failed swallow study.  10/29 Passed swallow eval, Swan out.   No complaints this morning, walked in hall.  Pressured speech.   Now on mexiletine, no VT.    Objective:   Weight Range: 74.7 kg Body mass index is 27.4 kg/m.   Vital Signs:   Temp:  [97.6 F (36.4 C)-98.8 F (37.1 C)] 98.8 F (37.1 C) (10/31 0353) Pulse Rate:  [76-90] 89 (10/31 0700) Resp:  [13-27] 16 (10/31 0700) BP: (103-123)/(64-91) 116/74 (10/31 0410) SpO2:  [88 %-99 %] 98 % (10/31 0700) Weight:  [74.7 kg] 74.7 kg (10/31 0500) Last BM Date: 02/11/20  Weight change: Filed Weights   02/10/20 0600 02/11/20 0500 02/12/20 0500  Weight: 76.7 kg 76.2 kg 74.7 kg    Intake/Output:   Intake/Output Summary (Last 24 hours) at 02/12/2020 0820 Last data filed at 02/11/2020 2200 Gross per 24 hour  Intake 663.06 ml  Output --  Net 663.06 ml      Physical Exam   General: NAD Neck: No JVD, no thyromegaly or thyroid nodule.  Lungs: Clear to auscultation bilaterally with normal respiratory effort. CV: Nondisplaced PMI.  Heart regular S1/S2, no S3/S4, no murmur.  No peripheral edema.   Abdomen: Soft, nontender, no hepatosplenomegaly, no distention.  Skin: Intact without lesions or rashes.  Neurologic: Alert and oriented x 3.  Psych: Normal affect. Extremities: No clubbing or cyanosis.  HEENT: Normal.    Telemetry   SR with occasional PVCs, no VT (personally reviewed).   EKG    N/a   Labs    CBC Recent Labs    02/11/20 1335 02/12/20 0227  WBC 7.2 6.9  HGB 11.3* 11.0*  HCT 35.7* 34.8*  MCV 96.2 95.6  PLT 203 216   Basic Metabolic  Panel Recent Labs    02/11/20 1335 02/12/20 0227  NA 137 137  K 4.0 3.8  CL 97* 99  CO2 31 30  GLUCOSE 125* 131*  BUN 17 14  CREATININE 0.61 0.52  CALCIUM 9.2 9.1  MG 1.8 1.9   Liver Function Tests Recent Labs    02/11/20 1335 02/12/20 0227  AST 53* 39  ALT 101* 86*  ALKPHOS 135* 124  BILITOT 0.5 0.9  PROT 6.4* 6.1*  ALBUMIN 3.2* 3.1*   No results for input(s): LIPASE, AMYLASE in the last 72 hours. Cardiac Enzymes No results for input(s): CKTOTAL, CKMB, CKMBINDEX, TROPONINI in the last 72 hours.  BNP: BNP (last 3 results) Recent Labs    02/11/20 1335  BNP 88.4    ProBNP (last 3 results) No results for input(s): PROBNP in the last 8760 hours.   D-Dimer No results for input(s): DDIMER in the last 72 hours. Hemoglobin A1C No results for input(s): HGBA1C in the last 72 hours. Fasting Lipid Panel No results for input(s): CHOL, HDL, LDLCALC, TRIG, CHOLHDL, LDLDIRECT in the last 72 hours. Thyroid Function Tests No results for input(s): TSH, T4TOTAL, T3FREE, THYROIDAB in the last 72 hours.  Invalid input(s): FREET3  Other results:   Imaging    No results found.   Medications:     Scheduled Medications:  buPROPion  100 mg Oral TID   carvedilol  3.125 mg Oral BID WC   Chlorhexidine Gluconate Cloth  6 each Topical Q0600   digoxin  0.125 mg Oral Daily   docusate sodium  100 mg Oral BID   enoxaparin (LOVENOX) injection  40 mg Subcutaneous Q24H   lamoTRIgine  150 mg Oral BID   losartan  12.5 mg Oral BID   mouth rinse  15 mL Mouth Rinse BID   mexiletine  150 mg Oral Q8H   pantoprazole  40 mg Oral Daily   potassium chloride  40 mEq Oral Once   sacubitril-valsartan  1 tablet Oral BID   spironolactone  25 mg Oral Daily    Infusions:  sodium chloride Stopped (02/10/20 2015)   magnesium sulfate bolus IVPB      PRN Medications: acetaminophen, clonazepam, influenza vac split quadrivalent PF, levalbuterol, polyethylene glycol,  polyvinyl alcohol    Patient Profile  Kristina Nichols is a 54 year old with h/o witnessed cardiac arrest at homewith refractory vib/ torsades. Intubated in ER. Taken for [facility] with clean coronaries, findings consistent with stress induced cardiomyopathy, LVEF <25%.   Cardiogenic shock/torsades. Multiple shocks.   Assessment/Plan   1. VT: Recurrent torsades with multiple shocks, also triggered by RV pacing from temporary wire (removed). Coronaries normal. Amiodarone stopped due to prolonged QT interval, on lidocaine now to mexiletine.  No further VT. I suspect that prolonged QT is the sequelae of stress (Takotsubo-type) cardiomyopathy.  - Continue mexiletine.  - Continue Coreg.  - Cardiac MRI to assess for infiltrative disease.  - ICD for secondary prevention, clinically ready for device implantation, timing per EP.  2. Acute systolic CHF/cardiogenic shock: EF <20% on echo (no LV thrombus), initial cath with normal coronaries. Concern for stress (Takotsubo-type) cardiomyopathy. Recent emotional stress. Initially required IABP and norepinephrine, now weaned off and stable.   - Continue digoxin.   - Continue Coreg 3.125 mg bid   - Continue spironolactone 25 mg daily.  - Stop losartan, start Entresto 24/26 bid.   - Will order cardiac MRI, try to get in am before ICD placed.  3. Neuro: Seems coherent but very anxious.   4. Acute hypoxemic respiratory failure: On vent for several days. CXR with bilateral patchy infiltrates, ?aspiration in setting of cardiac arrest. Now resolved, extubated, CXR has cleared.  Off abx.  5. Elevated LFTs: Suspect shock liver, trending down.  6. ID: Initially treated with abx for PNA, now off.   7. Thrombocytopenia: Suspect due to IABP, resolved  Length of Stay: 7  Marca Ancona, MD  02/12/2020, 8:20 AM  Advanced Heart Failure Team Pager [phone] (M-F; 7a - 4p)  Please contact [facility] for night-coverage after hours (4p -7a ) and weekends on  [email or web address]

## 2020-02-12 NOTE — Progress Notes (Signed)
PROGRESS NOTE                                                                                                                                                                                                             Patient Demographics:    Kristina Nichols, is a 54 y.o. female, DOB - Nov 19, 1965, VOH:[id number]  Outpatient Primary MD for the patient is Patient, No Pcp Per    LOS - 7  Admit date - 02/05/2020    Chief Complaint  Patient presents with  . post cpr       Brief Narrative (HPI from H&P) 54 yo woman here after witnessed cardiac arrest at home with refractory vib/ torsades.  Intubated in ER.  Taken for [facility] with clean coronaries, findings consistent with stress induced cardiomyopathy, LVEF < 25%, she was briefly intubated for 4 days extubated on 02/09/2020, thereafter required brief BiPAP, she was stabilized and transferred to hospitalist service on 02/11/2020.   Subjective:   Patient in bed, appears comfortable, denies any headache, no fever, no chest pain or pressure, no shortness of breath , no abdominal pain. No focal weakness.   Assessment  & Plan :     1. Cardiac arrest due to V. fib and torsades with acute systolic heart failure EF 25% due to most likely Takotsubo cardiomyopathy with no significant CAD on cath.  Causing acute hypoxic respiratory failure requiring intubation for 4 days, respiratory failure has resolved, she has been seen by cardiology and PCCM, underwent left and right heart cath, briefly also required intra-aortic balloon pump.  Cardiology following she required amiodarone drip and lidocaine drip, she is now on mexiletine, digoxin, Aldactone, ARB along with Coreg, likely will require AICD placement this discharge.  Cardiology and EP following.    2.  Acute ICU encephalopathy.  Gradually improving, no focal deficits, overall mildly anxious, also has underlying depression, CT head nonacute.   Continue supportive care.  Minimize benzos and narcotics.  3.  Dysphagia - on D2- NT, SLP following.  4.  Anxiety and depression.  Not suicidal homicidal, cautiously resume home medications if they are not QT prolonging.  We will stop Vyvanse per pharmacy as can prolong QT, resume Wellbutrin, Lamictal and as needed Klonopin.   5.  Normocytic normochromic anemia.  Likely due to frequent blood draws.  Monitor.  Condition - Extremely Guarded  Family Communication  :  Husband bedside 02/11/20  Code Status :  Full  Consults  :  PCCM, Cards  Procedures  :    10/24 ETT >> 10/28 10/24 R femoral sheath >>10/25, temp pacer removed; IABP inserted >> 10/25 R IJ cordis with PA catheter >>   TTE - 1. No LV thrombus visualized, thought cannot excluded given LV substrate. Left ventricular ejection fraction, by estimation, is <20%. The left ventricle has severely decreased function. The left ventricle demonstrates global hypokinesis. Left ventricular diastolic parameters are consistent with Grade I diastolic dysfunction (impaired relaxation).  2. Right ventricular systolic function is normal. The right ventricular size is normal.  3. The mitral valve is grossly normal. No evidence of mitral valve regurgitation.  4. The aortic valve was not well visualized. Aortic valve regurgitation is trivial.  5. Aortic IABP in Descending Aorta.  6. The inferior vena cava is dilated in size with <50% respiratory variability, suggesting right atrial pressure of 15 mmHg.   SW.Ganz - 1. Mildly elevated left and right heart filling pressures. 2. Low cardiac output by thermodilution on norepinephrine 4, preserved output by Fick. 3. Successful IABP placement. 4. Swan left in place right IJ  CT Head - non acute   Cardiac cath 10/24 >>  There is severe left ventricular systolic dysfunction.  LV end diastolic pressure is moderately elevated.  The left ventricular ejection fraction is less than 25% by visual  estimate.  There is no mitral valve regurgitation. 1. No angiographic evidence of CAD 2. Severe segmental LV systolic dysfunction with wall motion suggesting Takotsubo's cardiomyopathy (stress induced cardiomyopathy).  3. Elevated LVEDP 4. Ventricular fibrillation/Torsades-Temporary Pacemaker placed per request of admitting/consulting team in case of need of overdrive pacing tonight  10/25 RHC >> 1. Mildly elevated left and right heart filling pressures.  2. Low cardiac output by thermodilution on norepinephrine 4, preserved output by Fick.  3. Successful IABP placement.  4. Swan left in place right IJ.  RA mean 12 RV 31/13 PA 33/19, mean 24 PCWP mean 17 Oxygen saturations: PA 73% AO 100% Cardiac Output (Thermo) 3.53 Cardiac Index (Thermo) 1.95 PVR 2 WU Cardiac Output (Fick) 4.95 Cardiac Index (Fick) 2.74   PUD Prophylaxis :  PPI  Disposition Plan  :    Status is: Inpatient  Remains inpatient appropriate because:IV treatments appropriate due to intensity of illness or inability to take PO   Dispo: The patient is from: Home              Anticipated d/c is to: Home              Anticipated d/c date is: > 3 days              Patient currently is not medically stable to d/c.  DVT Prophylaxis  :  Lovenox   Lab Results  Component Value Date   PLT 216 02/12/2020    Diet :  Diet Order            DIET DYS 2 Room service appropriate? Yes with Assist; Fluid consistency: Nectar Thick  Diet effective now                  Inpatient Medications  Scheduled Meds: . buPROPion  100 mg Oral TID  . carvedilol  3.125 mg Oral BID WC  . Chlorhexidine Gluconate Cloth  6 each Topical Q0600  . digoxin  0.125 mg Oral Daily  . docusate sodium  100 mg Oral BID  . enoxaparin (LOVENOX) injection  40 mg Subcutaneous Q24H  . lamoTRIgine  150 mg Oral BID  . losartan  12.5 mg Oral BID  . mouth rinse  15 mL Mouth Rinse BID  . mexiletine  150 mg Oral Q8H  . pantoprazole  40 mg Oral  Daily  . potassium chloride  40 mEq Oral Once  . sacubitril-valsartan  1 tablet Oral BID  . spironolactone  25 mg Oral Daily   Continuous Infusions: . sodium chloride Stopped (02/10/20 2015)  . magnesium sulfate bolus IVPB     PRN Meds:.acetaminophen, clonazepam, influenza vac split quadrivalent PF, levalbuterol, polyethylene glycol, polyvinyl alcohol  Antibiotics  :    Anti-infectives (From admission, onward)   Start     Dose/Rate Route Frequency Ordered Stop   02/08/20 2100  vancomycin (VANCOCIN) IVPB 1000 mg/200 mL premix  Status:  Discontinued        1,000 mg 200 mL/hr over 60 Minutes Intravenous Every 12 hours 02/08/20 0754 02/09/20 0954   02/08/20 0945  Vancomycin (VANCOCIN) 1,500 mg in sodium chloride 0.9 % 500 mL IVPB  Status:  Discontinued        1,500 mg 250 mL/hr over 120 Minutes Intravenous  Once 02/08/20 0848 02/08/20 0848   02/08/20 0945  vancomycin (VANCOCIN) 1,500 mg in sodium chloride 0.9 % 250 mL IVPB  Status:  Discontinued        1,500 mg 125 mL/hr over 120 Minutes Intravenous  Once 02/08/20 0850 02/08/20 0851   02/08/20 0945  vancomycin (VANCOREADY) IVPB 1500 mg/300 mL        1,500 mg 150 mL/hr over 120 Minutes Intravenous  Once 02/08/20 0854 02/08/20 1142   02/08/20 0900  ceFEPIme (MAXIPIME) 2 g in sodium chloride 0.9 % 100 mL IVPB  Status:  Discontinued        2 g 200 mL/hr over 30 Minutes Intravenous Every 8 hours 02/08/20 0752 02/10/20 0939   02/08/20 0845  Vancomycin (VANCOCIN) 1,500 mg in sodium chloride 0.9 % 500 mL IVPB  Status:  Discontinued        1,500 mg 250 mL/hr over 120 Minutes Intravenous  Once 02/08/20 0752 02/08/20 0854       Time Spent in minutes  30   Susa Raring M.D on 02/12/2020 at 8:59 AM  To page go to [email or web address] - password [facility]  [facility] -  Office  [phone]    See all Orders from today for further details    Objective:   Vitals:   02/12/20 0410 02/12/20 0500 02/12/20 0700 02/12/20 0827  BP: 116/74      Pulse: 87  89   Resp: 20  16   Temp:    98.6 F (37 C)  TempSrc:      SpO2: 95%  98%   Weight:  74.7 kg    Height:        Wt Readings from Last 3 Encounters:  02/12/20 74.7 kg     Intake/Output Summary (Last 24 hours) at 02/12/2020 0859 Last data filed at 02/11/2020 2200 Gross per 24 hour  Intake 663.06 ml  Output --  Net 663.06 ml     Physical Exam  Awake Alert, anxious affect, no F N deficits Fowler.AT,PERRAL Supple Neck,No JVD, No cervical lymphadenopathy appriciated.  Symmetrical Chest wall movement, Good air movement bilaterally, CTAB RRR,No Gallops, Rubs or new Murmurs, No Parasternal Heave +ve B.Sounds, Abd Soft, No tenderness, No organomegaly appriciated, No rebound - guarding or rigidity.  No Cyanosis, Clubbing or edema, No new Rash or bruise    Data Review:    CBC Recent Labs  Lab 02/08/20 0558 02/09/20 0306 02/10/20 0214 02/11/20 1335 02/12/20 0227  WBC 8.4 6.6 7.3 7.2 6.9  HGB 9.9* 9.2* 9.9* 11.3* 11.0*  HCT 31.2* 29.0* 30.9* 35.7* 34.8*  PLT 75* 68* 101* 203 216  MCV 97.2 97.0 96.6 96.2 95.6  MCH 30.8 30.8 30.9 30.5 30.2  MCHC 31.7 31.7 32.0 31.7 31.6  RDW 13.9 14.1 13.5 13.2 13.1    Recent Labs  Lab 02/05/20 1745 02/05/20 1806 02/05/20 1937 02/05/20 2107 02/05/20 2118 02/05/20 2225 02/06/20 0110 02/06/20 0214 02/06/20 1241 02/06/20 1543 02/07/20 1442 02/07/20 1442 02/08/20 0306 02/09/20 0306 02/10/20 0214 02/10/20 0330 02/10/20 0637 02/11/20 1335 02/12/20 0227  NA 139   < >  --    < >  --  137  --    < > 137   < > 136   < > 137 137  --   --  137 137 137  K 3.9   < >  --    < >  --  4.9  --    < > 4.1   < > 4.2   < > 3.9 4.2  --   --  4.4 4.0 3.8  CL 107   < >  --    < >  --  109  --    < > 106   < > 108   < > 107 108  --   --  101 97* 99  CO2 21*  --   --    < >  --  22  --    < > 24   < > 22   < > 21* 23  --   --  GLUCOSE 182*   < >  --    < >  --  199*  --    < > 120*   < > 142*   < > 128* 113*  --   --  132* 125*  131*  BUN 18   < >  --    < >  --  16  --    < > 14   < > 12   < > 10 15  --   --  CREATININE 1.18*   < >  --    < >  --  0.94  --    < > 0.76   < > 0.64   < > 0.63 0.62  --   --  0.49 0.61 0.52  CALCIUM 8.7*  --   --    < >  --  7.0*  --    < > 8.2*   < > 7.7*   < > 7.9* 8.2*  --   --  8.7* 9.2 9.1  AST  --   --   --   --   --  898*  --   --   --   --  119*  --   --   --   --  46*  --  53* 39  ALT  --   --   --   --   --  482*  --   --   --   --  210*  --   --   --   --  104*  --  101* 86*  ALKPHOS  --   --   --   --   --  75  --   --   --   --  22  --   --   --   --  134*  --  135* 124  BILITOT  --   --   --   --   --  0.8  --   --   --   --  0.6  --   --   --   --  0.7  --  0.5 0.9  ALBUMIN  --   --   --   --   --  3.1*  --   --   --   --  2.8*  --   --   --   --  2.8*  --  3.2* 3.1*  MG  --    < >  --    < >  --   --   --    < >  --    < > 2.3   < > 2.0 1.8 2.0  --   --  1.8 1.9  DDIMER  --   --  >20.00*  --   --   --   --   --   --   --   --   --   --   --   --   --   --   --   --   PROCALCITON  --   --   --   --   --   --   --   --   --   --  0.10  --  <0.10  --   --   --   --   --   --   LATICACIDVEN 3.8*  --  2.4*  --   --  1.3 1.9  --   --   --   --   --   --   --   --   --   --   --   --   INR 1.0  --   --   --   --   --   --   --   --   --   --   --   --   --   --   --   --   --   --   TSH  --   --   --   --  7.724*  --   --   --   --   --   --   --   --   --   --   --   --   --   --   HGBA1C  --   --   --   --   --   --   --   --  5.3  --   --   --   --   --   --   --   --   --   --   BNP  --   --   --   --   --   --   --   --   --   --   --   --   --   --   --   --   --  88.4  --    < > = values in this interval not displayed.    ------------------------------------------------------------------------------------------------------------------  No results for input(s): CHOL, HDL, LDLCALC, TRIG, CHOLHDL, LDLDIRECT in the last 72 hours.  Lab Results  Component Value Date    HGBA1C 5.3 02/06/2020   ------------------------------------------------------------------------------------------------------------------ No results for input(s): TSH, T4TOTAL, T3FREE, THYROIDAB in the last 72 hours.  Invalid input(s): FREET3  Cardiac Enzymes No results for input(s): CKMB, TROPONINI, MYOGLOBIN in the last 168 hours.  Invalid input(s): CK ------------------------------------------------------------------------------------------------------------------    Component Value Date/Time   BNP 88.4 02/11/2020 1335    Micro Results Recent Results (from the past 240 hour(s))  Resp Panel by RT PCR (RSV, Flu A&B, Covid) - Nasopharyngeal Swab     Status: None   Collection Time: 02/05/20  6:14 PM   Specimen: Nasopharyngeal Swab  Result Value Ref Range Status   SARS Coronavirus 2 by RT PCR NEGATIVE NEGATIVE Final    Comment: (NOTE) SARS-CoV-2 target nucleic acids are NOT DETECTED.  The SARS-CoV-2 RNA is generally detectable in upper respiratoy specimens during the acute phase of infection. The lowest concentration of SARS-CoV-2 viral copies this assay can detect is 131 copies/mL. A negative result does not preclude SARS-Cov-2 infection and should not be used as the sole basis for treatment or other patient management decisions. A negative result may occur with  improper specimen collection/handling, submission of specimen other than nasopharyngeal swab, presence of viral mutation(s) within the areas targeted by this assay, and inadequate number of viral copies (<131 copies/mL). A negative result must be combined with clinical observations, patient history, and epidemiological information. The expected result is Negative.  Fact Sheet for Patients:  https://www.moore.com/  Fact Sheet for Healthcare Providers:  https://www.young.biz/  This test is no t yet approved or cleared by the [place] FDA and  has been authorized for  detection and/or diagnosis of SARS-CoV-2 by FDA under an Emergency Use Authorization (EUA). This EUA will remain  in effect (meaning this test can be used) for the duration of the COVID-19 declaration under Section 564(b)(1) of the Act, 21 U.S.C. section 360bbb-3(b)(1), unless the authorization is terminated or revoked sooner.     Influenza A by PCR NEGATIVE NEGATIVE Final   Influenza B by PCR NEGATIVE NEGATIVE Final    Comment: (NOTE) The Xpert Xpress SARS-CoV-2/FLU/RSV assay is intended as an aid in  the diagnosis of influenza from Nasopharyngeal swab specimens and  should not be used as a sole basis for treatment. Nasal washings and  aspirates are unacceptable for Xpert Xpress SARS-CoV-2/FLU/RSV  testing.  Fact Sheet for Patients: https://www.moore.com/  Fact Sheet for Healthcare Providers: https://www.young.biz/  This test is not yet approved or cleared by the [place] FDA and  has been authorized for detection and/or diagnosis of SARS-CoV-2 by  FDA under an Emergency Use Authorization (EUA). This EUA will remain  in effect (meaning this test can be used) for the duration of the  Covid-19 declaration under Section 564(b)(1) of the Act, 21  U.S.C. section 360bbb-3(b)(1), unless the authorization is  terminated or revoked.    Respiratory Syncytial Virus by PCR NEGATIVE NEGATIVE Final    Comment: (NOTE) Fact Sheet for Patients: https://www.moore.com/  Fact Sheet for Healthcare Providers: https://www.young.biz/  This test is not yet approved or cleared by the [place] FDA and  has been authorized for detection and/or diagnosis of SARS-CoV-2 by  FDA under an Emergency Use Authorization (EUA). This EUA will remain  in effect (meaning this test can be used) for the duration of the  COVID-19 declaration under Section 564(b)(1) of the Act, 21 U.S.C.  section 360bbb-3(b)(1), unless the  authorization is terminated or  revoked. Performed at [facility], [address] [place], [place] [address]   MRSA PCR Screening     Status: None   Collection Time: 02/05/20 10:25 PM   Specimen: Nasopharyngeal  Result Value Ref Range Status   MRSA by PCR NEGATIVE NEGATIVE Final    Comment:        The GeneXpert MRSA Assay (FDA approved for NASAL specimens only), is one component of a comprehensive MRSA colonization surveillance program. It is not intended to diagnose MRSA infection nor to guide or monitor treatment for MRSA infections. Performed at [facility], [address] [place], [place] [address]   Culture, blood (routine x 2)     Status: None (Preliminary result)   Collection Time: 02/07/20  8:21 AM   Specimen: BLOOD LEFT ARM  Result Value Ref Range Status   Specimen Description BLOOD LEFT ARM  Final   Special Requests   Final    BOTTLES DRAWN AEROBIC AND ANAEROBIC Blood Culture adequate volume   Culture   Final    NO GROWTH 4 DAYS Performed at [facility], [address], [place], [place] [address]    Report Status PENDING  Incomplete  Culture, blood (routine x 2)     Status: None (Preliminary result)   Collection Time: 02/07/20  8:34 AM   Specimen: BLOOD  Result Value Ref Range Status   Specimen Description BLOOD LEFT ANTECUBITAL  Final   Special Requests   Final    BOTTLES DRAWN AEROBIC AND ANAEROBIC Blood Culture adequate volume   Culture   Final    NO GROWTH 4 DAYS Performed at [facility], [address], [place], [place] [address]    Report Status PENDING  Incomplete  Culture, Urine     Status: None   Collection Time: 02/07/20  2:42 PM   Specimen: Urine, Catheterized  Result Value Ref Range Status   Specimen Description URINE, CATHETERIZED  Final   Special Requests NONE  Final   Culture   Final    NO GROWTH Performed at [facility], [address] [place], [place] [address]    Report Status 02/08/2020 FINAL   Final    Radiology Reports CT Head Wo Contrast  Result Date: 02/05/2020 CLINICAL DATA:  55 year old female with altered mental status post cardiac arrest. EXAM: CT HEAD WITHOUT CONTRAST TECHNIQUE: Contiguous axial images were obtained from the base of the skull through the vertex without intravenous contrast. COMPARISON:  None. FINDINGS: Brain: No evidence of acute infarction, hemorrhage, hydrocephalus, extra-axial collection or mass lesion/mass effect. Vascular: No hyperdense vessel or unexpected calcification. Skull: Normal. Negative for fracture or focal lesion. Sinuses/Orbits: No acute finding. Other: Oral intubation noted. IMPRESSION: No evidence of acute intracranial abnormality. Electronically Signed   By: Harmon Pier M.D.   On: 02/05/2020 18:48   CARDIAC CATHETERIZATION  Result Date: 02/06/2020 1. Mildly elevated left and right heart filling pressures. 2. Low cardiac output by thermodilution on norepinephrine 4, preserved output by Fick. 3. Successful IABP placement. 4. Swan left in place right IJ.   CARDIAC CATHETERIZATION  Result Date: 02/05/2020  There is severe left ventricular systolic dysfunction.  LV end diastolic pressure is moderately elevated.  The left ventricular ejection fraction is less than 25% by visual estimate.  There is no mitral valve regurgitation.  1. No angiographic evidence of CAD 2. Severe segmental LV systolic dysfunction with wall motion  suggesting Takotsubo's cardiomyopathy (stress induced cardiomyopathy). 3. Elevated LVEDP 4. Ventricular fibrillation/Torsades-Temporary Pacemaker placed per request of admitting/consulting team in case of need of overdrive pacing tonight Recommendations: No further ischemic workup. Management of VF/Torsades per EP team in the am. Medical management of likely stress induced cardiomyopathy.   DG CHEST PORT 1 VIEW  Result Date: 02/08/2020 CLINICAL DATA:  Acute systolic heart failure. EXAM: PORTABLE CHEST 1 VIEW COMPARISON:   February 07, 2020. FINDINGS: The heart size and mediastinal contours are within normal limits. Endotracheal and nasogastric tubes are unchanged in position. Right internal jugular Swan-Ganz catheter is unchanged. No pneumothorax or pleural effusion is noted. Left lung is clear. Right upper lobe airspace opacity is nearly resolved. The visualized skeletal structures are unremarkable. IMPRESSION: Stable support apparatus. Right upper lobe airspace opacity is nearly resolved. Electronically Signed   By: Lupita Raider M.D.   On: 02/08/2020 08:36   DG CHEST PORT 1 VIEW  Result Date: 02/07/2020 CLINICAL DATA:  54 year old female with heart failure. EXAM: PORTABLE CHEST 1 VIEW COMPARISON:  Earlier radiograph dated 02/07/2020. FINDINGS: Endotracheal tube, enteric tube, and Swan-Ganz in similar position. There has been interval development of an area of opacity in the right upper lung field with overall decreased volume consistent with lobar atelectasis or collapse. Clinical correlation is recommended. No large pleural effusion. No pneumothorax. Stable cardiac silhouette. No acute osseous pathology. IMPRESSION: Interval development of lobar atelectasis or collapse of the right upper lung field. Follow-up recommended. Electronically Signed   By: Elgie Collard M.D.   On: 02/07/2020 18:20   DG Chest Port 1 View  Result Date: 02/07/2020 CLINICAL DATA:  Post cardiac arrest. EXAM: PORTABLE CHEST 1 VIEW COMPARISON:  02/05/2020 FINDINGS: Endotracheal tube in good position. NG tube enters the stomach with the tip not visualized. Involve placement of right jugular Swan-Ganz catheter. Swan-Ganz catheter tip right lower lobe pulmonary artery. No pneumothorax. Improvement in bilateral infiltrates compared with the prior study. No effusion. IMPRESSION: Swan-Ganz catheter tip right lower lobe pulmonary artery. No pneumothorax. Improvement in bilateral airspace disease likely edema. Electronically Signed   By: Marlan Palau  M.D.   On: 02/07/2020 08:35   DG Chest Port 1 View  Result Date: 02/05/2020 CLINICAL DATA:  Status post CPR. EXAM: PORTABLE CHEST 1 VIEW COMPARISON:  February 05, 2020 (6:03 p.m.) FINDINGS: There is stable endotracheal tube and nasogastric tube positioning. Mild diffusely increased interstitial lung markings are seen with moderate to marked severity patchy left suprahilar and right upper lobe infiltrates. Mild bilateral lower lobe infiltrates are also seen. These areas are mildly increased in severity when compared to the prior study. The heart size and mediastinal contours are within normal limits. The visualized skeletal structures are unremarkable. IMPRESSION: Moderate to marked severity bilateral patchy infiltrates, as described above, with mild interval increase in severity when compared to the prior exam. Electronically Signed   By: Aram Candela M.D.   On: 02/05/2020 20:25   DG Chest Portable 1 View  Result Date: 02/05/2020 CLINICAL DATA:  Post intubation, witnessed cardiac arrest and CPR EXAM: PORTABLE CHEST 1 VIEW COMPARISON:  None FINDINGS: Endotracheal tube terminates between the clavicles approximately 4 cm above the carina. Gastric tube courses through in a off the field of the radiograph, side port in the region of the mid stomach, tip off the field of view. Pacer defibrillator pads project over the patient. Upper lobe opacities are present with some elevation of the minor fissure in the RIGHT chest. Less opacity at the  LEFT lung apex. On limited assessment no acute skeletal process. IMPRESSION: 1. Endotracheal tube terminates between the clavicles approximately 4 cm above the carina. 2. Suggested volume loss and consolidative changes in the RIGHT upper lobe greater than LEFT upper lobe. Findings could be related to volume loss with pneumonitis from aspiration and potential background of asymmetric pulmonary edema or pneumonia. Underlying mass would be difficult to exclude. Follow-up after  resolution of acute symptoms is suggested to exclude underlying lesion. 3. Gastric tube courses through a off the field of the radiograph, side port in the region of the mid stomach, tip off the field of view. Electronically Signed   By: Donzetta Kohut M.D.   On: 02/05/2020 18:20   ECHOCARDIOGRAM COMPLETE  Result Date: 02/06/2020    ECHOCARDIOGRAM REPORT   Patient Name:   [facility] Date of Exam: 02/06/2020 Medical Rec #:  [id number]    Height:       65.0 in Accession #:    [id number]   Weight:       161.8 lb Date of Birth:  Feb 21, 1966   BSA:          1.808 m Patient Age:    53 years     BP:           136/80 mmHg Patient Gender: F            HR:           64 bpm. Exam Location:  Inpatient Procedure: 2D Echo Indications:    I46.9 cardiac arrest  History:        Patient has no prior history of Echocardiogram examinations.                 Risk Factors:Dyslipidemia. Temporary pacemaker.  Sonographer:    Celene Skeen RDCS (AE) Referring Phys: [id number] [address] D [facility]  Sonographer Comments: Suboptimal apical window and echo performed with patient supine and on artificial respirator. restricted mobility IMPRESSIONS  1. No LV thrombus visualized, thought cannot excluded given LV substrate. Left ventricular ejection fraction, by estimation, is <20%. The left ventricle has severely decreased function. The left ventricle demonstrates global hypokinesis. Left ventricular diastolic parameters are consistent with Grade I diastolic dysfunction (impaired relaxation).  2. Right ventricular systolic function is normal. The right ventricular size is normal.  3. The mitral valve is grossly normal. No evidence of mitral valve regurgitation.  4. The aortic valve was not well visualized. Aortic valve regurgitation is trivial.  5. Aortic IABP in Descending Aorta.  6. The inferior vena cava is dilated in size with <50% respiratory variability, suggesting right atrial pressure of 15 mmHg. Comparison(s): No prior Echocardiogram.  Conclusion(s)/Recommendation(s): Severe LV Dysfunction; primary team aware. FINDINGS  Left Ventricle: No LV thrombus visualized, thought cannot excluded given LV substrate. Left ventricular ejection fraction, by estimation, is <20%. The left ventricle has severely decreased function. The left ventricle demonstrates global hypokinesis. The left ventricular internal cavity size was normal in size. There is no left ventricular hypertrophy. Left ventricular diastolic parameters are consistent with Grade I diastolic dysfunction (impaired relaxation). Right Ventricle: The right ventricular size is normal. No increase in right ventricular wall thickness. Right ventricular systolic function is normal. Left Atrium: Left atrial size was normal in size. Right Atrium: Right atrial size was normal in size. Pericardium: There is no evidence of pericardial effusion. Mitral Valve: The mitral valve is grossly normal. No evidence of mitral valve regurgitation. Tricuspid Valve: The tricuspid valve is not well visualized. Tricuspid valve  regurgitation is not demonstrated. Aortic Valve: The aortic valve was not well visualized. Aortic valve regurgitation is trivial. Pulmonic Valve: The pulmonic valve was not well visualized. Pulmonic valve regurgitation is not visualized. Aorta: IABP in Descending Aorta and the aortic root is normal in size and structure. Venous: The pulmonary veins were not well visualized. The inferior vena cava is dilated in size with less than 50% respiratory variability, suggesting right atrial pressure of 15 mmHg. IAS/Shunts: The atrial septum is grossly normal.  LEFT VENTRICLE PLAX 2D LVIDd:         4.40 cm  Diastology LVIDs:         3.60 cm  LV e' medial:    5.98 cm/s LV PW:         0.80 cm  LV E/e' medial:  11.3 LV IVS:        0.70 cm  LV e' lateral:   6.53 cm/s LVOT diam:     2.20 cm  LV E/e' lateral: 10.4 LV SV:         54 LV SV Index:   30 LVOT Area:     3.80 cm  RIGHT VENTRICLE RV S prime:     10.00 cm/s  TAPSE (M-mode): 2.0 cm LEFT ATRIUM         Index LA diam:    2.80 cm 1.55 cm/m  AORTIC VALVE LVOT Vmax:   72.50 cm/s LVOT Vmean:  59.300 cm/s LVOT VTI:    0.141 m  AORTA Ao Root diam: 2.80 cm MITRAL VALVE MV Area (PHT): 3.72 cm    SHUNTS MV Decel Time: 204 msec    Systemic VTI:  0.14 m MV E velocity: 67.70 cm/s  Systemic Diam: 2.20 cm MV A velocity: 63.80 cm/s MV E/A ratio:  1.06 Riley Lam MD Electronically signed by Riley Lam MD Signature Date/Time: 02/06/2020/6:04:10 PM    Final    VAS [place] LOWER EXTREMITY VENOUS (DVT)  Result Date: 02/07/2020  Lower Venous DVTStudy Indications: Elevated ddimer.  Limitations: Line and bandages. Comparison Study: no prior Performing Technologist: Blanch Media RVS  Examination Guidelines: A complete evaluation includes B-mode imaging, spectral Doppler, color Doppler, and power Doppler as needed of all accessible portions of each vessel. Bilateral testing is considered an integral part of a complete examination. Limited examinations for reoccurring indications may be performed as noted. The reflux portion of the exam is performed with the patient in reverse Trendelenburg.  +---------+---------------+---------+-----------+----------+-------------------+ RIGHT    CompressibilityPhasicitySpontaneityPropertiesThrombus Aging      +---------+---------------+---------+-----------+----------+-------------------+ CFV                                                   not visualized due                                                        to line             +---------+---------------+---------+-----------+----------+-------------------+ SFJ  not visualized due                                                        to line             +---------+---------------+---------+-----------+----------+-------------------+ FV Prox  Full                                                              +---------+---------------+---------+-----------+----------+-------------------+ FV Mid   Full                                                             +---------+---------------+---------+-----------+----------+-------------------+ FV DistalFull                                                             +---------+---------------+---------+-----------+----------+-------------------+ PFV      Full                                                             +---------+---------------+---------+-----------+----------+-------------------+ POP      Full           Yes      Yes                                      +---------+---------------+---------+-----------+----------+-------------------+ PTV      Full                                                             +---------+---------------+---------+-----------+----------+-------------------+ PERO     Full                                                             +---------+---------------+---------+-----------+----------+-------------------+   +---------+---------------+---------+-----------+----------+--------------+ LEFT     CompressibilityPhasicitySpontaneityPropertiesThrombus Aging +---------+---------------+---------+-----------+----------+--------------+ CFV      Full           Yes      Yes                                 +---------+---------------+---------+-----------+----------+--------------+  SFJ      Full                                                        +---------+---------------+---------+-----------+----------+--------------+ FV Prox  Full                                                        +---------+---------------+---------+-----------+----------+--------------+ FV Mid   Full                                                        +---------+---------------+---------+-----------+----------+--------------+ FV DistalFull                                                         +---------+---------------+---------+-----------+----------+--------------+ PFV      Full                                                        +---------+---------------+---------+-----------+----------+--------------+ POP      Full           Yes      Yes                                 +---------+---------------+---------+-----------+----------+--------------+ PTV      Full                                                        +---------+---------------+---------+-----------+----------+--------------+ PERO     Full                                                        +---------+---------------+---------+-----------+----------+--------------+     *See table(s) above for measurements and observations. Electronically signed by Waverly Ferrari MD on 02/07/2020 at 5:38:04 PM.    Final    [place] EKG SITE RITE  Result Date: 02/06/2020 If Site Rite image not attached, placement could not be confirmed due to current cardiac rhythm.

## 2020-02-13 ENCOUNTER — Inpatient Hospital Stay (HOSPITAL_COMMUNITY): Payer: BC Managed Care – PPO

## 2020-02-13 DIAGNOSIS — I5021 Acute systolic (congestive) heart failure: Secondary | ICD-10-CM

## 2020-02-13 LAB — BASIC METABOLIC PANEL
Anion gap: 13 (ref 5–15)
BUN: 13 mg/dL (ref 6–20)
CO2: 23 mmol/L (ref 22–32)
Calcium: 9.2 mg/dL (ref 8.9–10.3)
Chloride: 101 mmol/L (ref 98–111)
Creatinine, Ser: 0.67 mg/dL (ref 0.44–1.00)
GFR, Estimated: >60 mL/min (ref >60)
Glucose, Bld: 142 mg/dL — ABNORMAL HIGH (ref 70–99)
Potassium: 4 mmol/L (ref 3.5–5.1)
Sodium: 137 mmol/L (ref 135–145)

## 2020-02-13 IMAGING — MR MR CARD MORPHOLOGY WO/W CM
45 of 48 series · 45 of 48 positions shown · IV contrast (Contrast agent)
Comparison: none

CLINICAL DATA: Cardiomyopathy of uncertain etiology

EXAM:
CARDIAC MRI
TECHNIQUE: The patient was scanned on a 1.5 Tesla GE magnet. A dedicated
cardiac coil was used. Functional imaging was done using Fiest[employer]
sequences. [DATE], and 4 chamber views were done to assess for RWMA's.
Modified RAO rule using a short axis stack was used to
calculate an ejection fraction on a dedicated work station using
Circle software. The patient received 8 cc of Gadavist. After 10
minutes inversion recovery sequences were used to assess for
infiltration and scar tissue.

[Series 4: [id number] · axial · 8.0mm · 1.41mm/px · 1 of 16 slices shown]
[im 1/16]
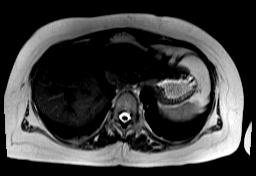

[Series 8: bSSFP · oblique · 8.0mm · 1.61mm/px · 1 of 25 slices shown (1 of 18)]
[im 1/25]
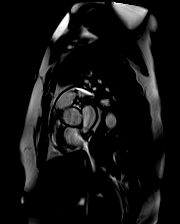

[Series 9: bSSFP · oblique · 8.0mm · 1.61mm/px · 1 of 25 slices shown (2 of 18)]
[im 1/25]
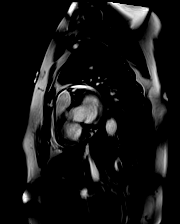

[Series 10: bSSFP · oblique · 8.0mm · 1.61mm/px · 1 of 25 slices shown (3 of 18)]
[im 1/25]
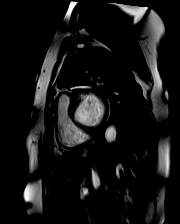

[Series 11: bSSFP · oblique · 8.0mm · 1.61mm/px · 1 of 25 slices shown (4 of 18)]
[im 1/25]
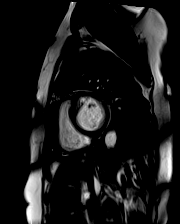

[Series 12: bSSFP · oblique · 8.0mm · 1.61mm/px · 1 of 25 slices shown (5 of 18)]
[im 1/25]
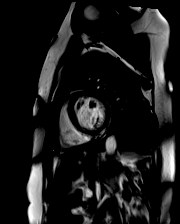

[Series 13: bSSFP · oblique · 8.0mm · 1.61mm/px · 1 of 25 slices shown (6 of 18)]
[im 1/25]
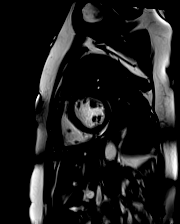

[Series 14: bSSFP · oblique · 8.0mm · 1.61mm/px · 1 of 25 slices shown (7 of 18)]
[im 1/25]
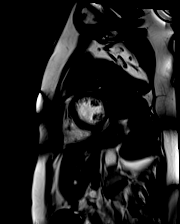

[Series 15: bSSFP · oblique · 8.0mm · 1.61mm/px · 1 of 25 slices shown (8 of 18)]
[im 1/25]
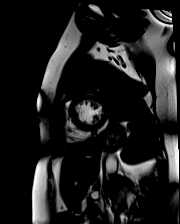

[Series 16: bSSFP · oblique · 8.0mm · 1.61mm/px · 1 of 25 slices shown (9 of 18)]
[im 1/25]
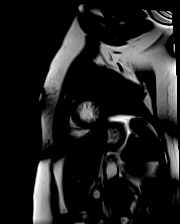

[Series 17: bSSFP · oblique · 8.0mm · 1.61mm/px · 1 of 25 slices shown (10 of 18)]
[im 1/25]
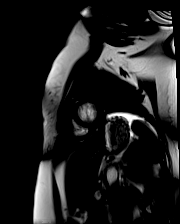

[Series 18: bSSFP · oblique · 8.0mm · 1.61mm/px · 1 of 25 slices shown (11 of 18)]
[im 1/25]
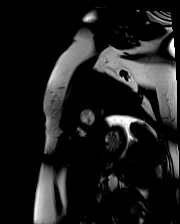

[Series 19: bSSFP · oblique · 8.0mm · 1.61mm/px · 1 of 25 slices shown (12 of 18)]
[im 1/25]
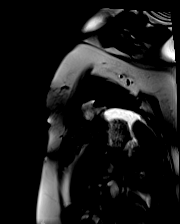

[Series 20: bSSFP · oblique · 8.0mm · 1.61mm/px · 1 of 25 slices shown (13 of 18)]
[im 1/25]
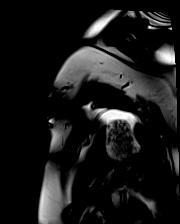

[Series 21: bSSFP · oblique · 8.0mm · 1.61mm/px · 1 of 25 slices shown (14 of 18)]
[im 1/25]
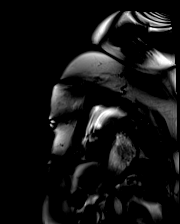

[Series 22: bSSFP · oblique · 8.0mm · 1.61mm/px · 1 of 25 slices shown (15 of 18)]
[im 1/25]
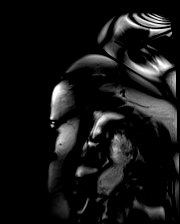

[Series 23: bSSFP · axial · 6.0mm · 1.41mm/px · 1 of 25 slices shown (16 of 18)]
[im 1/25]
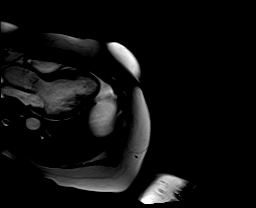

[Series 24: bSSFP · oblique · 6.0mm · 1.41mm/px · 1 of 25 slices shown (17 of 18)]
[im 1/25]
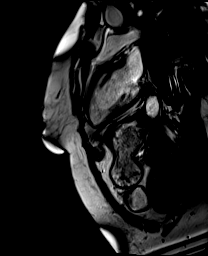

[Series 25: bSSFP · oblique · 6.0mm · 1.41mm/px · 1 of 25 slices shown (18 of 18)]
[im 1/25]
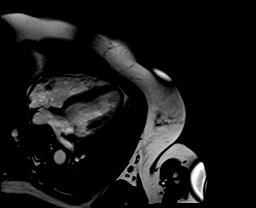

[Series 26: (id)_long_t1 · oblique · 8.0mm · 1.56mm/px · 1 of 24 slices shown]
[im 1/24]
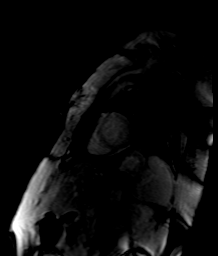

[Series 27: (id)_long_t1_moco · oblique · 8.0mm · 1.56mm/px · 1 of 24 slices shown]
[im 1/24]
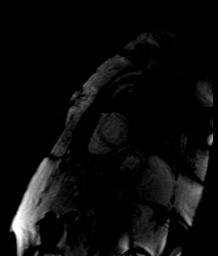

[Series 28: (id)_long_t1_moco_t1 · oblique · 8.0mm · 1.56mm/px · 1 of 6 slices shown]
[im 1/6]
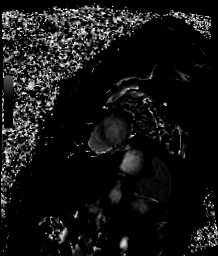

[Series 30: cine_trufi_cs_rt_short axis · oblique · 8.0mm · 1.73mm/px · 1 of 49 slices shown (1 of 14)]
[im 1/49]
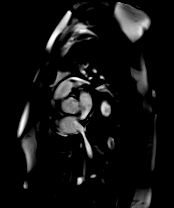

[Series 30: cine_trufi_cs_rt_short axis · oblique · 8.0mm · 1.73mm/px · 1 of 49 slices shown (2 of 14)]
[im 1/49]
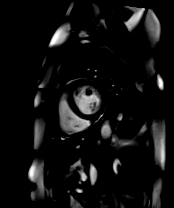

[Series 30: cine_trufi_cs_rt_short axis · oblique · 8.0mm · 1.73mm/px · 1 of 49 slices shown (3 of 14)]
[im 1/49]
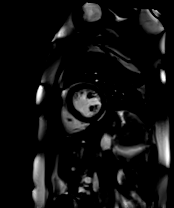

[Series 30: cine_trufi_cs_rt_short axis · oblique · 8.0mm · 1.73mm/px · 1 of 49 slices shown (4 of 14)]
[im 1/49]
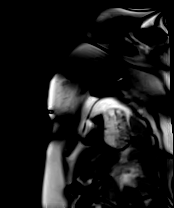

[Series 30: cine_trufi_cs_rt_short axis · oblique · 8.0mm · 1.73mm/px · 1 of 49 slices shown (5 of 14)]
[im 1/49]
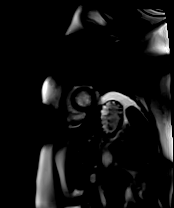

[Series 30: cine_trufi_cs_rt_short axis · oblique · 8.0mm · 1.73mm/px · 1 of 49 slices shown (6 of 14)]
[im 1/49]
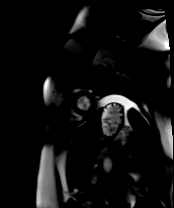

[Series 30: cine_trufi_cs_rt_short axis · oblique · 8.0mm · 1.73mm/px · 1 of 49 slices shown (7 of 14)]
[im 1/49]
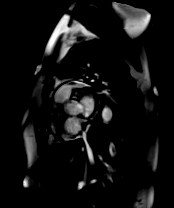

[Series 30: cine_trufi_cs_rt_short axis · oblique · 8.0mm · 1.73mm/px · 1 of 49 slices shown (8 of 14)]
[im 1/49]
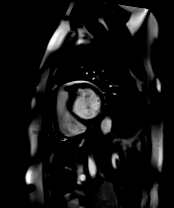

[Series 30: cine_trufi_cs_rt_short axis · oblique · 8.0mm · 1.73mm/px · 1 of 49 slices shown (9 of 14)]
[im 1/49]
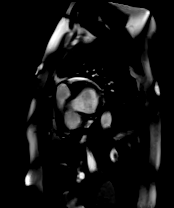

[Series 30: cine_trufi_cs_rt_short axis · oblique · 8.0mm · 1.73mm/px · 1 of 49 slices shown (10 of 14)]
[im 1/49]
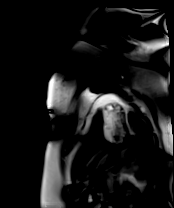

[Series 30: cine_trufi_cs_rt_short axis · oblique · 8.0mm · 1.73mm/px · 1 of 49 slices shown (11 of 14)]
[im 1/49]
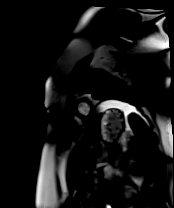

[Series 30: cine_trufi_cs_rt_short axis · oblique · 8.0mm · 1.73mm/px · 1 of 49 slices shown (12 of 14)]
[im 1/49]
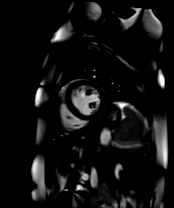

[Series 30: cine_trufi_cs_rt_short axis · oblique · 8.0mm · 1.73mm/px · 1 of 49 slices shown (13 of 14)]
[im 1/49]
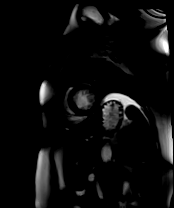

[Series 30: cine_trufi_cs_rt_short axis · oblique · 8.0mm · 1.73mm/px · 1 of 49 slices shown (14 of 14)]
[im 1/49]
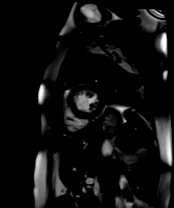

[Series 31: (id)_trufi · oblique · 8.0mm · 2.08mm/px · 1 of 9 slices shown]
[im 1/9]
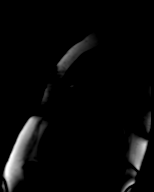

[Series 32: (id)_trufi_moco · oblique · 8.0mm · 2.08mm/px · 1 of 9 slices shown]
[im 1/9]
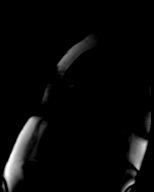

[Series 33: (id)_trufi_moco_t2 · oblique · 8.0mm · 2.08mm/px · 1 of 3 slices shown]
[im 1/3]
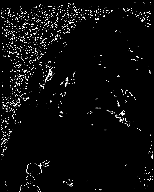

[Series 35: STIR · oblique · 8.0mm · 1.92mm/px · 1 of 14 slices shown]
[im 1/14]
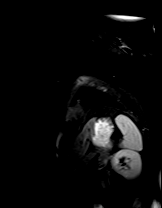

[Series 36: pre short axis · oblique · non-contrast · 8.0mm · 2.25mm/px · 1 of 10 slices shown (1 of 5)]
[im 1/10]
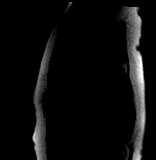

[Series 37: pre short axis · oblique · non-contrast · 8.0mm · 2.25mm/px · 1 of 10 slices shown (2 of 5)]
[im 1/10]
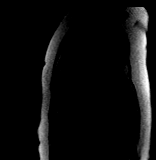

[Series 38: pre short axis · oblique · non-contrast · 8.0mm · 2.25mm/px · 1 of 10 slices shown (3 of 5)]
[im 1/10]
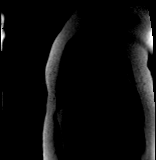

[Series 39: pre short axis · oblique · non-contrast · 8.0mm · 2.25mm/px · 1 of 10 slices shown (4 of 5)]
[im 1/10]
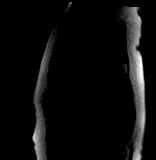

[Series 40: pre short axis · oblique · non-contrast · 8.0mm · 2.25mm/px · 1 of 10 slices shown (5 of 5)]
[im 1/10]
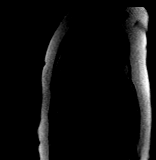

[45 of 48 positions shown; findings below may reference images not displayed]

FINDINGS: Fluid in fissure on right, otherwise no gross lung abnormalities.

Normal left ventricular size and wall thickness, EF 56% with normal
wall motion. Normal right ventricular size and systolic function, EF
47%. Mild biatrial enlargement. Trileaflet aortic valve, no
significant regurgitation or stenosis. No significant mitral
regurgitation noted.

On delayed enhancement imaging, there was no myocardial late
gadolinium enhancement (LGE).

Measurements:
LVEDV 113 mL
LVSV 63 mL
LVEF 56%

RVEDV 95 mL
RVSV 44 mL
RVEF 47%
IMPRESSION: 1.  Normal LV size and systolic function, EF 56%.

2.  Normal RV size and systolic function, EF 47%.

3. No myocardial LGE, so no definitive evidence for prior MI,
myocarditis, or infiltrative disease.

Significant improvement in LV systolic function compared to prior
echo. Suggestive of recovery of Takotsubo cardiomyopathy.

RAO

## 2020-02-13 MED ORDER — GADOBUTROL 1 MMOL/ML IV SOLN
8.0000 mL | Freq: Once | INTRAVENOUS | Status: AC | PRN
  Administered 2020-02-13: 8 mL via INTRAVENOUS

## 2020-02-13 NOTE — Progress Notes (Signed)
  Speech Language Pathology Treatment: Dysphagia  Patient Details Name: Ethan Kasperski MRN: [id number] DOB: 11-Mar-1966 Today's Date: 02/13/2020 Time: 5361-4431 SLP Time Calculation (min) (ACUTE ONLY): 20 min  Assessment / Plan / Recommendation Clinical Impression  Patient was seen with husband present in room to address dysphagia goals with trails of thin liquids and regular solids. Patient was very talkative, appeared with some anxiety (she has had to be off her ADHD medications during this hospital stay) and did require verbal cues to direct and redirect her attention. Of note, her voice is clear and strong and per husband, its "80-90%" of what it is normally. SLP requested patient take successive large sips of water via straw. She exhibited one instance of delayed throat clear, but overall, she tolerated thin liquids without difficulty. Initially when eating graham cracker, she would take very small bites, but then was able to take normal bites and did not exhibit any difficulty, no coughing or throat clearing. SLP discussed plan with husband and patient to upgrade diet to regular texture solids and thin liquids. Patient seemed to indicate that she felt comfortable with the nectar liquids, but SLP discussed with her that as her voice is significantly improved since previous speech therapy visit and primary cause of her dysphagia is likely from intubation, expect that it will continue to improve and that further use of nectar thick liquids is not indicated at this time.   HPI HPI: Pt is a 54 year old woman with a history of gastroparesis, GERD, depression and anxiety who presented with stress-induced cardiomyopathy causing recurrent V. fib and torsades cardiac arrests. She had witnessed LOC on 10/24, possibly seizure activity by her husband.  7 min of CPR reported, including Chest compressions. Intubated in ED 10/24-10/28. Taken for [facility] with clean coronaries, findings consistent with stress induced  cardiomyopathy, LVEF < 25%.       SLP Plan  Continue with current plan of care       Recommendations  Diet recommendations: Regular;Thin liquid Liquids provided via: Straw;Cup Medication Administration: Whole meds with liquid Supervision: Patient able to self feed Compensations: Slow rate;Small sips/bites Postural Changes and/or Swallow Maneuvers: Seated upright 90 degrees                Oral Care Recommendations: Oral care BID Follow up Recommendations: None SLP Visit Diagnosis: Dysphagia, unspecified (R13.10) Plan: Continue with current plan of care        Angela Nevin, MA, CCC-SLP Speech Therapy

## 2020-02-13 NOTE — Progress Notes (Addendum)
CARDIAC REHAB PHASE I   PRE:  Rate/Rhythm: 101 ST with PVCs    BP: sitting 95/81    SaO2:   MODE:  Ambulation: 150 ft   POST:  Rate/Rhythm: 113 ST    BP: sitting 90/67     SaO2: 97 RA  Pt talkative and distracted, very anxious in general, but able to ambulate in hall with min assist with gait belt. Several occasions of sudden distraction with sudden change of pace or direction, x1 LOB, able to catch herself. Husband present and supportive. BP low, HR somewhat elevated but stable. No c/o, denied SOB. To recliner. Left materials for education for pt and husband to read. Hard to help her focus currently for discussion. She is currently focused on her diet in hospital. Will f/u. 1020-1100   Harriet Masson CES, ACSM 02/13/2020 11:11 AM

## 2020-02-13 NOTE — Progress Notes (Signed)
PROGRESS NOTE                                                                                                                                                                                                             Patient Demographics:    Kristina Nichols, is a 54 y.o. female, DOB - 01-13-1966, GQQ:[id number]  Outpatient Primary MD for the patient is Patient, No Pcp Per    LOS - 8  Admit date - 02/05/2020    Chief Complaint  Patient presents with   post cpr       Brief Narrative (HPI from H&P) 54 yo woman here after witnessed cardiac arrest at home with refractory vib/ torsades.  Intubated in ER.  Taken for [facility] with clean coronaries, findings consistent with stress induced cardiomyopathy, LVEF < 25%, she was briefly intubated for 4 days extubated on 02/09/2020, thereafter required brief BiPAP, she was stabilized and transferred to hospitalist service on 02/11/2020.   Subjective:   Patient in bed, appears comfortable, denies any headache, no fever, no chest pain or pressure, no shortness of breath , no abdominal pain. No focal weakness.   Assessment  & Plan :     1. Cardiac arrest due to V. fib and torsades with acute systolic heart failure EF 25% due to most likely Takotsubo cardiomyopathy with no significant CAD on cath.  Causing acute hypoxic respiratory failure requiring intubation for 4 days, respiratory failure has resolved, she has been seen by cardiology and PCCM, underwent left and right heart cath, briefly also required intra-aortic balloon pump.  Cardiology following she required amiodarone drip and lidocaine drip, she is now on mexiletine, digoxin, Aldactone, ARB along with Coreg, Cardiology and electrophysiology group is following the patient, underwent cardiac MRI 02/13/2020, AICD likely soon..    2.  Acute ICU encephalopathy.  Gradually improving, no focal deficits, overall mildly anxious, also has  underlying depression, CT head nonacute.  Continue supportive care.  Minimize benzos and narcotics.  3.  Dysphagia - on D2- NT, SLP following.  4.  Anxiety and depression.  Not suicidal homicidal, cautiously resume home medications if they are not QT prolonging.  We will stop Vyvanse per pharmacy as can prolong QT, resume Wellbutrin, Lamictal and as needed Klonopin.   5.  Normocytic normochromic anemia.  Likely due to frequent blood draws.  Monitor.  Condition - Extremely Guarded  Family Communication  :  Husband bedside 02/11/20  Code Status :  Full  Consults  :  PCCM, Cards  Procedures  :    10/24 ETT >> 10/28 10/24 R femoral sheath >>10/25, temp pacer removed; IABP inserted >> 10/25 R IJ cordis with PA catheter >>   c MRI -  TTE - 1. No LV thrombus visualized, thought cannot excluded given LV substrate. Left ventricular ejection fraction, by estimation, is <20%. The left ventricle has severely decreased function. The left ventricle demonstrates global hypokinesis. Left ventricular diastolic parameters are consistent with Grade I diastolic dysfunction (impaired relaxation).  2. Right ventricular systolic function is normal. The right ventricular size is normal.  3. The mitral valve is grossly normal. No evidence of mitral valve regurgitation.  4. The aortic valve was not well visualized. Aortic valve regurgitation is trivial.  5. Aortic IABP in Descending Aorta.  6. The inferior vena cava is dilated in size with <50% respiratory variability, suggesting right atrial pressure of 15 mmHg.   SW.Ganz - 1. Mildly elevated left and right heart filling pressures. 2. Low cardiac output by thermodilution on norepinephrine 4, preserved output by Fick. 3. Successful IABP placement. 4. Swan left in place right IJ  CT Head - non acute   Cardiac cath 10/24 >>  There is severe left ventricular systolic dysfunction.  LV end diastolic pressure is moderately elevated.  The left  ventricular ejection fraction is less than 25% by visual estimate.  There is no mitral valve regurgitation. 1. No angiographic evidence of CAD 2. Severe segmental LV systolic dysfunction with wall motion suggesting Takotsubo's cardiomyopathy (stress induced cardiomyopathy).  3. Elevated LVEDP 4. Ventricular fibrillation/Torsades-Temporary Pacemaker placed per request of admitting/consulting team in case of need of overdrive pacing tonight  10/25 RHC >> 1. Mildly elevated left and right heart filling pressures.  2. Low cardiac output by thermodilution on norepinephrine 4, preserved output by Fick.  3. Successful IABP placement.  4. Swan left in place right IJ.  RA mean 12 RV 31/13 PA 33/19, mean 24 PCWP mean 17 Oxygen saturations: PA 73% AO 100% Cardiac Output (Thermo) 3.53 Cardiac Index (Thermo) 1.95 PVR 2 WU Cardiac Output (Fick) 4.95 Cardiac Index (Fick) 2.74   PUD Prophylaxis :  PPI  Disposition Plan  :    Status is: Inpatient  Remains inpatient appropriate because:IV treatments appropriate due to intensity of illness or inability to take PO   Dispo: The patient is from: Home              Anticipated d/c is to: Home              Anticipated d/c date is: > 3 days              Patient currently is not medically stable to d/c.  DVT Prophylaxis  :  Lovenox   Lab Results  Component Value Date   PLT 216 02/12/2020    Diet :  Diet Order            DIET DYS 2 Room service appropriate? Yes with Assist; Fluid consistency: Nectar Thick  Diet effective now                  Inpatient Medications  Scheduled Meds:  buPROPion  100 mg Oral TID   carvedilol  3.125 mg Oral BID WC   Chlorhexidine Gluconate Cloth  6 each Topical Q0600   digoxin  0.125 mg Oral Daily  docusate sodium  100 mg Oral BID   enoxaparin (LOVENOX) injection  40 mg Subcutaneous Q24H   lamoTRIgine  150 mg Oral BID   mouth rinse  15 mL Mouth Rinse BID   mexiletine  150 mg Oral Q8H    pantoprazole  40 mg Oral Daily   sacubitril-valsartan  1 tablet Oral BID   spironolactone  25 mg Oral Daily   Continuous Infusions:  sodium chloride Stopped (02/10/20 2015)   PRN Meds:.acetaminophen, clonazepam, influenza vac split quadrivalent PF, levalbuterol, polyethylene glycol, polyvinyl alcohol  Antibiotics  :    Anti-infectives (From admission, onward)   Start     Dose/Rate Route Frequency Ordered Stop   02/08/20 2100  vancomycin (VANCOCIN) IVPB 1000 mg/200 mL premix  Status:  Discontinued        1,000 mg 200 mL/hr over 60 Minutes Intravenous Every 12 hours 02/08/20 0754 02/09/20 0954   02/08/20 0945  Vancomycin (VANCOCIN) 1,500 mg in sodium chloride 0.9 % 500 mL IVPB  Status:  Discontinued        1,500 mg 250 mL/hr over 120 Minutes Intravenous  Once 02/08/20 0848 02/08/20 0848   02/08/20 0945  vancomycin (VANCOCIN) 1,500 mg in sodium chloride 0.9 % 250 mL IVPB  Status:  Discontinued        1,500 mg 125 mL/hr over 120 Minutes Intravenous  Once 02/08/20 0850 02/08/20 0851   02/08/20 0945  vancomycin (VANCOREADY) IVPB 1500 mg/300 mL        1,500 mg 150 mL/hr over 120 Minutes Intravenous  Once 02/08/20 0854 02/08/20 1142   02/08/20 0900  ceFEPIme (MAXIPIME) 2 g in sodium chloride 0.9 % 100 mL IVPB  Status:  Discontinued        2 g 200 mL/hr over 30 Minutes Intravenous Every 8 hours 02/08/20 0752 02/10/20 0939   02/08/20 0845  Vancomycin (VANCOCIN) 1,500 mg in sodium chloride 0.9 % 500 mL IVPB  Status:  Discontinued        1,500 mg 250 mL/hr over 120 Minutes Intravenous  Once 02/08/20 0752 02/08/20 0854       Time Spent in minutes  30   Susa Raring M.D on 02/13/2020 at 10:45 AM  To page go to [email or web address] - password [facility]  [facility] -  Office  [phone]    See all Orders from today for further details    Objective:   Vitals:   02/12/20 1355 02/12/20 2209 02/13/20 0007 02/13/20 1011  BP: 138/83 101/69 105/64   Pulse: 89 86 82 100  Resp:   18    Temp:   98.5 F (36.9 C)   TempSrc:   Oral   SpO2: 100% 97% 96%   Weight:      Height:        Wt Readings from Last 3 Encounters:  02/12/20 74.7 kg     Intake/Output Summary (Last 24 hours) at 02/13/2020 1045 Last data filed at 02/12/2020 1800 Gross per 24 hour  Intake 367.4 ml  Output 0 ml  Net 367.4 ml     Physical Exam  Awake Alert, No new F.N deficits, anxious affect [place].AT,PERRAL Supple Neck,No JVD, No cervical lymphadenopathy appriciated.  Symmetrical Chest wall movement, Good air movement bilaterally, CTAB RRR,No Gallops, Rubs or new Murmurs, No Parasternal Heave +ve B.Sounds, Abd Soft, No tenderness, No organomegaly appriciated, No rebound - guarding or rigidity. No Cyanosis, Clubbing or edema, No new Rash or bruise     Data Review:    CBC Recent Labs  Lab 02/08/20 0558 02/09/20 0306 02/10/20 0214 02/11/20 1335 02/12/20 0227  WBC 8.4 6.6 7.3 7.2 6.9  HGB 9.9* 9.2* 9.9* 11.3* 11.0*  HCT 31.2* 29.0* 30.9* 35.7* 34.8*  PLT 75* 68* 101* 203 216  MCV 97.2 97.0 96.6 96.2 95.6  MCH 30.8 30.8 30.9 30.5 30.2  MCHC 31.7 31.7 32.0 31.7 31.6  RDW 13.9 14.1 13.5 13.2 13.1    Recent Labs  Lab 02/06/20 1241 02/06/20 1543 02/07/20 1442 02/07/20 1442 02/08/20 0306 02/09/20 0306 02/10/20 0214 02/10/20 0330 02/10/20 0637 02/11/20 1335 02/12/20 0227  NA 137   < > 136   < > 137 137  --   --  137 137 137  K 4.1   < > 4.2   < > 3.9 4.2  --   --  4.4 4.0 3.8  CL 106   < > 108   < > 107 108  --   --  101 97* 99  CO2 24   < > 22   < > 21* 23  --   --  28 31 30   GLUCOSE 120*   < > 142*   < > 128* 113*  --   --  132* 125* 131*  BUN 14   < > 12   < > 10 15  --   --  12 17 14   CREATININE 0.76   < > 0.64   < > 0.63 0.62  --   --  0.49 0.61 0.52  CALCIUM 8.2*   < > 7.7*   < > 7.9* 8.2*  --   --  8.7* 9.2 9.1  AST  --   --  119*  --   --   --   --  46*  --  53* 39  ALT  --   --  210*  --   --   --   --  104*  --  101* 86*  ALKPHOS  --   --  67  --   --   --   --   134*  --  135* 124  BILITOT  --   --  0.6  --   --   --   --  0.7  --  0.5 0.9  ALBUMIN  --   --  2.8*  --   --   --   --  2.8*  --  3.2* 3.1*  MG  --    < > 2.3   < > 2.0 1.8 2.0  --   --  1.8 1.9  PROCALCITON  --   --  0.10  --  <0.10  --   --   --   --   --   --   HGBA1C 5.3  --   --   --   --   --   --   --   --   --   --   BNP  --   --   --   --   --   --   --   --   --  88.4  --    < > = values in this interval not displayed.    ------------------------------------------------------------------------------------------------------------------ No results for input(s): CHOL, HDL, LDLCALC, TRIG, CHOLHDL, LDLDIRECT in the last 72 hours.  Lab Results  Component Value Date   HGBA1C 5.3 02/06/2020   ------------------------------------------------------------------------------------------------------------------ No results for input(s): TSH, T4TOTAL, T3FREE, THYROIDAB in the last 72 hours.  Invalid input(s): FREET3  Cardiac Enzymes No results for input(s): CKMB, TROPONINI, MYOGLOBIN in the last 168 hours.  Invalid input(s): CK ------------------------------------------------------------------------------------------------------------------    Component Value Date/Time   BNP 88.4 02/11/2020 1335    Micro Results Recent Results (from the past 240 hour(s))  Resp Panel by RT PCR (RSV, Flu A&B, Covid) - Nasopharyngeal Swab     Status: None   Collection Time: 02/05/20  6:14 PM   Specimen: Nasopharyngeal Swab  Result Value Ref Range Status   SARS Coronavirus 2 by RT PCR NEGATIVE NEGATIVE Final    Comment: (NOTE) SARS-CoV-2 target nucleic acids are NOT DETECTED.  The SARS-CoV-2 RNA is generally detectable in upper respiratoy specimens during the acute phase of infection. The lowest concentration of SARS-CoV-2 viral copies this assay can detect is 131 copies/mL. A negative result does not preclude SARS-Cov-2 infection and should not be used as the sole basis for treatment or other  patient management decisions. A negative result may occur with  improper specimen collection/handling, submission of specimen other than nasopharyngeal swab, presence of viral mutation(s) within the areas targeted by this assay, and inadequate number of viral copies (<131 copies/mL). A negative result must be combined with clinical observations, patient history, and epidemiological information. The expected result is Negative.  Fact Sheet for Patients:  https://www.moore.com/  Fact Sheet for Healthcare Providers:  https://www.young.biz/  This test is no t yet approved or cleared by the [place] FDA and  has been authorized for detection and/or diagnosis of SARS-CoV-2 by FDA under an Emergency Use Authorization (EUA). This EUA will remain  in effect (meaning this test can be used) for the duration of the COVID-19 declaration under Section 564(b)(1) of the Act, 21 U.S.C. section 360bbb-3(b)(1), unless the authorization is terminated or revoked sooner.     Influenza A by PCR NEGATIVE NEGATIVE Final   Influenza B by PCR NEGATIVE NEGATIVE Final    Comment: (NOTE) The Xpert Xpress SARS-CoV-2/FLU/RSV assay is intended as an aid in  the diagnosis of influenza from Nasopharyngeal swab specimens and  should not be used as a sole basis for treatment. Nasal washings and  aspirates are unacceptable for Xpert Xpress SARS-CoV-2/FLU/RSV  testing.  Fact Sheet for Patients: https://www.moore.com/  Fact Sheet for Healthcare Providers: https://www.young.biz/  This test is not yet approved or cleared by the [place] FDA and  has been authorized for detection and/or diagnosis of SARS-CoV-2 by  FDA under an Emergency Use Authorization (EUA). This EUA will remain  in effect (meaning this test can be used) for the duration of the  Covid-19 declaration under Section 564(b)(1) of the Act, 21  U.S.C. section  360bbb-3(b)(1), unless the authorization is  terminated or revoked.    Respiratory Syncytial Virus by PCR NEGATIVE NEGATIVE Final    Comment: (NOTE) Fact Sheet for Patients: https://www.moore.com/  Fact Sheet for Healthcare Providers: https://www.young.biz/  This test is not yet approved or cleared by the [place] FDA and  has been authorized for detection and/or diagnosis of SARS-CoV-2 by  FDA under an Emergency Use Authorization (EUA). This EUA will remain  in effect (meaning this test can be used) for the duration of the  COVID-19 declaration under Section 564(b)(1) of the Act, 21 U.S.C.  section 360bbb-3(b)(1), unless the authorization is terminated or  revoked. Performed at [facility], [address], [place], [place] [address]   MRSA PCR Screening     Status: None   Collection Time: 02/05/20 10:25 PM   Specimen: Nasopharyngeal  Result Value  Ref Range Status   MRSA by PCR NEGATIVE NEGATIVE Final    Comment:        The GeneXpert MRSA Assay (FDA approved for NASAL specimens only), is one component of a comprehensive MRSA colonization surveillance program. It is not intended to diagnose MRSA infection nor to guide or monitor treatment for MRSA infections. Performed at [facility], [address] [place], [place] [address]   Culture, blood (routine x 2)     Status: None   Collection Time: 02/07/20  8:21 AM   Specimen: BLOOD LEFT ARM  Result Value Ref Range Status   Specimen Description BLOOD LEFT ARM  Final   Special Requests   Final    BOTTLES DRAWN AEROBIC AND ANAEROBIC Blood Culture adequate volume   Culture   Final    NO GROWTH 5 DAYS Performed at [facility], [address] [place], [place] [address]    Report Status 02/12/2020 FINAL  Final  Culture, blood (routine x 2)     Status: None   Collection Time: 02/07/20  8:34 AM   Specimen: BLOOD  Result Value Ref Range Status   Specimen  Description BLOOD LEFT ANTECUBITAL  Final   Special Requests   Final    BOTTLES DRAWN AEROBIC AND ANAEROBIC Blood Culture adequate volume   Culture   Final    NO GROWTH 5 DAYS Performed at [facility], [address], [place], [place] [address]    Report Status 02/12/2020 FINAL  Final  Culture, Urine     Status: None   Collection Time: 02/07/20  2:42 PM   Specimen: Urine, Catheterized  Result Value Ref Range Status   Specimen Description URINE, CATHETERIZED  Final   Special Requests NONE  Final   Culture   Final    NO GROWTH Performed at [facility], [address] [place], [place] [address]    Report Status 02/08/2020 FINAL  Final    Radiology Reports CT Head Wo Contrast  Result Date: 02/05/2020 CLINICAL DATA:  54 year old female with altered mental status post cardiac arrest. EXAM: CT HEAD WITHOUT CONTRAST TECHNIQUE: Contiguous axial images were obtained from the base of the skull through the vertex without intravenous contrast. COMPARISON:  None. FINDINGS: Brain: No evidence of acute infarction, hemorrhage, hydrocephalus, extra-axial collection or mass lesion/mass effect. Vascular: No hyperdense vessel or unexpected calcification. Skull: Normal. Negative for fracture or focal lesion. Sinuses/Orbits: No acute finding. Other: Oral intubation noted. IMPRESSION: No evidence of acute intracranial abnormality. Electronically Signed   By: Harmon Pier M.D.   On: 02/05/2020 18:48   CARDIAC CATHETERIZATION  Result Date: 02/06/2020 1. Mildly elevated left and right heart filling pressures. 2. Low cardiac output by thermodilution on norepinephrine 4, preserved output by Fick. 3. Successful IABP placement. 4. Swan left in place right IJ.   CARDIAC CATHETERIZATION  Result Date: 02/05/2020  There is severe left ventricular systolic dysfunction.  LV end diastolic pressure is moderately elevated.  The left ventricular ejection fraction is less than 25% by visual estimate.   There is no mitral valve regurgitation.  1. No angiographic evidence of CAD 2. Severe segmental LV systolic dysfunction with wall motion suggesting Takotsubo's cardiomyopathy (stress induced cardiomyopathy). 3. Elevated LVEDP 4. Ventricular fibrillation/Torsades-Temporary Pacemaker placed per request of admitting/consulting team in case of need of overdrive pacing tonight Recommendations: No further ischemic workup. Management of VF/Torsades per EP team in the am. Medical management of likely stress induced cardiomyopathy.  DG CHEST PORT 1 VIEW  Result Date: 02/08/2020 CLINICAL DATA:  Acute systolic heart failure. EXAM: PORTABLE CHEST 1 VIEW COMPARISON:  February 07, 2020. FINDINGS: The heart size and mediastinal contours are within normal limits. Endotracheal and nasogastric tubes are unchanged in position. Right internal jugular Swan-Ganz catheter is unchanged. No pneumothorax or pleural effusion is noted. Left lung is clear. Right upper lobe airspace opacity is nearly resolved. The visualized skeletal structures are unremarkable. IMPRESSION: Stable support apparatus. Right upper lobe airspace opacity is nearly resolved. Electronically Signed   By: Lupita Raider M.D.   On: 02/08/2020 08:36   DG CHEST PORT 1 VIEW  Result Date: 02/07/2020 CLINICAL DATA:  54 year old female with heart failure. EXAM: PORTABLE CHEST 1 VIEW COMPARISON:  Earlier radiograph dated 02/07/2020. FINDINGS: Endotracheal tube, enteric tube, and Swan-Ganz in similar position. There has been interval development of an area of opacity in the right upper lung field with overall decreased volume consistent with lobar atelectasis or collapse. Clinical correlation is recommended. No large pleural effusion. No pneumothorax. Stable cardiac silhouette. No acute osseous pathology. IMPRESSION: Interval development of lobar atelectasis or collapse of the right upper lung field. Follow-up recommended. Electronically Signed   By: Elgie Collard  M.D.   On: 02/07/2020 18:20   DG Chest Port 1 View  Result Date: 02/07/2020 CLINICAL DATA:  Post cardiac arrest. EXAM: PORTABLE CHEST 1 VIEW COMPARISON:  02/05/2020 FINDINGS: Endotracheal tube in good position. NG tube enters the stomach with the tip not visualized. Involve placement of right jugular Swan-Ganz catheter. Swan-Ganz catheter tip right lower lobe pulmonary artery. No pneumothorax. Improvement in bilateral infiltrates compared with the prior study. No effusion. IMPRESSION: Swan-Ganz catheter tip right lower lobe pulmonary artery. No pneumothorax. Improvement in bilateral airspace disease likely edema. Electronically Signed   By: Marlan Palau M.D.   On: 02/07/2020 08:35   DG Chest Port 1 View  Result Date: 02/05/2020 CLINICAL DATA:  Status post CPR. EXAM: PORTABLE CHEST 1 VIEW COMPARISON:  February 05, 2020 (6:03 p.m.) FINDINGS: There is stable endotracheal tube and nasogastric tube positioning. Mild diffusely increased interstitial lung markings are seen with moderate to marked severity patchy left suprahilar and right upper lobe infiltrates. Mild bilateral lower lobe infiltrates are also seen. These areas are mildly increased in severity when compared to the prior study. The heart size and mediastinal contours are within normal limits. The visualized skeletal structures are unremarkable. IMPRESSION: Moderate to marked severity bilateral patchy infiltrates, as described above, with mild interval increase in severity when compared to the prior exam. Electronically Signed   By: Aram Candela M.D.   On: 02/05/2020 20:25   DG Chest Portable 1 View  Result Date: 02/05/2020 CLINICAL DATA:  Post intubation, witnessed cardiac arrest and CPR EXAM: PORTABLE CHEST 1 VIEW COMPARISON:  None FINDINGS: Endotracheal tube terminates between the clavicles approximately 4 cm above the carina. Gastric tube courses through in a off the field of the radiograph, side port in the region of the mid stomach,  tip off the field of view. Pacer defibrillator pads project over the patient. Upper lobe opacities are present with some elevation of the minor fissure in the RIGHT chest. Less opacity at the LEFT lung apex. On limited assessment no acute skeletal process. IMPRESSION: 1. Endotracheal tube terminates between the clavicles approximately 4 cm above the carina. 2. Suggested volume loss and consolidative changes in the RIGHT upper lobe greater than LEFT upper lobe. Findings could be related to volume loss with pneumonitis  from aspiration and potential background of asymmetric pulmonary edema or pneumonia. Underlying mass would be difficult to exclude. Follow-up after resolution of acute symptoms is suggested to exclude underlying lesion. 3. Gastric tube courses through a off the field of the radiograph, side port in the region of the mid stomach, tip off the field of view. Electronically Signed   By: Donzetta Kohut M.D.   On: 02/05/2020 18:20   ECHOCARDIOGRAM COMPLETE  Result Date: 02/06/2020    ECHOCARDIOGRAM REPORT   Patient Name:   [facility] Date of Exam: 02/06/2020 Medical Rec #:  [id number]    Height:       65.0 in Accession #:    [id number]   Weight:       161.8 lb Date of Birth:  06-09-1965   BSA:          1.808 m Patient Age:    53 years     BP:           136/80 mmHg Patient Gender: F            HR:           64 bpm. Exam Location:  Inpatient Procedure: 2D Echo Indications:    I46.9 cardiac arrest  History:        Patient has no prior history of Echocardiogram examinations.                 Risk Factors:Dyslipidemia. Temporary pacemaker.  Sonographer:    Celene Skeen RDCS (AE) Referring Phys: [id number] CHRISTOPHER D [facility]  Sonographer Comments: Suboptimal apical window and echo performed with patient supine and on artificial respirator. restricted mobility IMPRESSIONS  1. No LV thrombus visualized, thought cannot excluded given LV substrate. Left ventricular ejection fraction, by estimation, is <20%. The  left ventricle has severely decreased function. The left ventricle demonstrates global hypokinesis. Left ventricular diastolic parameters are consistent with Grade I diastolic dysfunction (impaired relaxation).  2. Right ventricular systolic function is normal. The right ventricular size is normal.  3. The mitral valve is grossly normal. No evidence of mitral valve regurgitation.  4. The aortic valve was not well visualized. Aortic valve regurgitation is trivial.  5. Aortic IABP in Descending Aorta.  6. The inferior vena cava is dilated in size with <50% respiratory variability, suggesting right atrial pressure of 15 mmHg. Comparison(s): No prior Echocardiogram. Conclusion(s)/Recommendation(s): Severe LV Dysfunction; primary team aware. FINDINGS  Left Ventricle: No LV thrombus visualized, thought cannot excluded given LV substrate. Left ventricular ejection fraction, by estimation, is <20%. The left ventricle has severely decreased function. The left ventricle demonstrates global hypokinesis. The left ventricular internal cavity size was normal in size. There is no left ventricular hypertrophy. Left ventricular diastolic parameters are consistent with Grade I diastolic dysfunction (impaired relaxation). Right Ventricle: The right ventricular size is normal. No increase in right ventricular wall thickness. Right ventricular systolic function is normal. Left Atrium: Left atrial size was normal in size. Right Atrium: Right atrial size was normal in size. Pericardium: There is no evidence of pericardial effusion. Mitral Valve: The mitral valve is grossly normal. No evidence of mitral valve regurgitation. Tricuspid Valve: The tricuspid valve is not well visualized. Tricuspid valve regurgitation is not demonstrated. Aortic Valve: The aortic valve was not well visualized. Aortic valve regurgitation is trivial. Pulmonic Valve: The pulmonic valve was not well visualized. Pulmonic valve regurgitation is not visualized. Aorta:  IABP in Descending Aorta and the aortic root is normal in size and structure. Venous: The  pulmonary veins were not well visualized. The inferior vena cava is dilated in size with less than 50% respiratory variability, suggesting right atrial pressure of 15 mmHg. IAS/Shunts: The atrial septum is grossly normal.  LEFT VENTRICLE PLAX 2D LVIDd:         4.40 cm  Diastology LVIDs:         3.60 cm  LV e' medial:    5.98 cm/s LV PW:         0.80 cm  LV E/e' medial:  11.3 LV IVS:        0.70 cm  LV e' lateral:   6.53 cm/s LVOT diam:     2.20 cm  LV E/e' lateral: 10.4 LV SV:         54 LV SV Index:   30 LVOT Area:     3.80 cm  RIGHT VENTRICLE RV S prime:     10.00 cm/s TAPSE (M-mode): 2.0 cm LEFT ATRIUM         Index LA diam:    2.80 cm 1.55 cm/m  AORTIC VALVE LVOT Vmax:   72.50 cm/s LVOT Vmean:  59.300 cm/s LVOT VTI:    0.141 m  AORTA Ao Root diam: 2.80 cm MITRAL VALVE MV Area (PHT): 3.72 cm    SHUNTS MV Decel Time: 204 msec    Systemic VTI:  0.14 m MV E velocity: 67.70 cm/s  Systemic Diam: 2.20 cm MV A velocity: 63.80 cm/s MV E/A ratio:  1.06 Riley Lam MD Electronically signed by Riley Lam MD Signature Date/Time: 02/06/2020/6:04:10 PM    Final    VAS [place] LOWER EXTREMITY VENOUS (DVT)  Result Date: 02/07/2020  Lower Venous DVTStudy Indications: Elevated ddimer.  Limitations: Line and bandages. Comparison Study: no prior Performing Technologist: Blanch Media RVS  Examination Guidelines: A complete evaluation includes B-mode imaging, spectral Doppler, color Doppler, and power Doppler as needed of all accessible portions of each vessel. Bilateral testing is considered an integral part of a complete examination. Limited examinations for reoccurring indications may be performed as noted. The reflux portion of the exam is performed with the patient in reverse Trendelenburg.  +---------+---------------+---------+-----------+----------+-------------------+  RIGHT      Compressibility Phasicity Spontaneity Properties Thrombus Aging       +---------+---------------+---------+-----------+----------+-------------------+  CFV                                                        not visualized due                                                               to line              +---------+---------------+---------+-----------+----------+-------------------+  SFJ                                                        not visualized due  to line              +---------+---------------+---------+-----------+----------+-------------------+  FV Prox   Full                                                                  +---------+---------------+---------+-----------+----------+-------------------+  FV Mid    Full                                                                  +---------+---------------+---------+-----------+----------+-------------------+  FV Distal Full                                                                  +---------+---------------+---------+-----------+----------+-------------------+  PFV       Full                                                                  +---------+---------------+---------+-----------+----------+-------------------+  POP       Full            Yes       Yes                                         +---------+---------------+---------+-----------+----------+-------------------+  PTV       Full                                                                  +---------+---------------+---------+-----------+----------+-------------------+  PERO      Full                                                                  +---------+---------------+---------+-----------+----------+-------------------+   +---------+---------------+---------+-----------+----------+--------------+  LEFT      Compressibility Phasicity Spontaneity Properties Thrombus Aging   +---------+---------------+---------+-----------+----------+--------------+  CFV       Full            Yes       Yes                                    +---------+---------------+---------+-----------+----------+--------------+  SFJ       Full                                                             +---------+---------------+---------+-----------+----------+--------------+  FV Prox   Full                                                             +---------+---------------+---------+-----------+----------+--------------+  FV Mid    Full                                                             +---------+---------------+---------+-----------+----------+--------------+  FV Distal Full                                                             +---------+---------------+---------+-----------+----------+--------------+  PFV       Full                                                             +---------+---------------+---------+-----------+----------+--------------+  POP       Full            Yes       Yes                                    +---------+---------------+---------+-----------+----------+--------------+  PTV       Full                                                             +---------+---------------+---------+-----------+----------+--------------+  PERO      Full                                                             +---------+---------------+---------+-----------+----------+--------------+     *See table(s) above for measurements and observations. Electronically signed by Waverly Ferrari MD on 02/07/2020 at 5:38:04 PM.    Final    [place] EKG SITE RITE  Result Date: 02/06/2020 If Site Rite image not attached, placement could not be confirmed due to current cardiac rhythm.

## 2020-02-13 NOTE — Progress Notes (Signed)
°  Reviewed case with Drs. Lalla Brothers and [place].   Plan for cMRI today.   Will tentatively plan for ICD with Dr. Ladona Ridgel tomorrow.   Casimiro Needle [address] [place], PA-C  02/13/2020 8:52 AM

## 2020-02-13 NOTE — Progress Notes (Signed)
Occupational Therapy Treatment Patient Details Name: Kristina Nichols: [id number] DOB: [birth year] Today's Date: 02/13/2020    History of present illness Pt is a 54 year old woman admitted on 02/05/20 with stress induced cardiomyopathy resulting in recurrent v-fib and torsades arrests. Intubated in ED, extubated 02/09/20. IABP 10/25-10/27. PMH: anxiety, depression, hypothyroidism, HLD, gastroparesis.   OT comments  Patient continues to make progress towards goals in skilled OT session. Patient's session encompassed therapeutic activities in order to assess cognitive function. Pt continues to demonstrate significant tangential speech, and is currently unable to answer a multi-step question follow a multi-step task. Pt also demonstrates increased focused attention, as pt would become distracted if therapist shifted in her seat, small motions outside, or intermittent monitor beeping. Pt is A&O x3 but not oriented to her admission, becoming tangential about her son (unable to state what condition her son has or why he is here despite increased redirection). Pt is unable to count backwards in serial sevens, and unable to spell WORLD backwards. Therapist handed pt a menu to simulate a real world multi-step task (locate the desserts, name 3, find the number to call the order in) and patient is unable to complete without step by step instructions and increased time. Of note, pt also perseverative on what therapist was going to "write about me" despite explaining to patient the reasoning for further assessment. Due to current findings, discharge recommendations are now updated to outpatient OT in order to address higher level cognitive tasks as pt was independent in all tasks prior to this recent admission.    Follow Up Recommendations  Outpatient OT;Supervision/Assistance - 24 hour (focusing on higher level cognitive tasks)    Equipment Recommendations  3 in 1 bedside commode    Recommendations for Other  Services      Precautions / Restrictions Precautions Precautions: Fall Restrictions Weight Bearing Restrictions: No       Mobility Bed Mobility               General bed mobility comments: up in chair upon arrival  Transfers                      Balance                                           ADL either performed or assessed with clinical judgement   ADL Overall ADL's : Needs assistance/impaired Eating/Feeding: Set up;Sitting                                   Functional mobility during ADLs: Minimal assistance;Cueing for safety;Cueing for sequencing General ADL Comments: Session focus on higher level cognitive tasks to assess impairment     Vision       Perception     Praxis      Cognition Arousal/Alertness: Awake/alert Behavior During Therapy: Restless;Impulsive Overall Cognitive Status: Impaired/Different from baseline Area of Impairment: Attention;Memory;Following commands;Awareness;Orientation;Safety/judgement;Problem solving                 Orientation Level: Disoriented to;Situation (focused on son's recent admission, never able to verbalize why she is here) Current Attention Level: Focused Memory: Decreased short-term memory Following Commands: Follows one step commands with increased time;Follows multi-step commands inconsistently Safety/Judgement: Decreased awareness of safety;Decreased awareness of deficits Awareness: Intellectual Problem Solving:  Slow processing;Decreased initiation;Difficulty sequencing;Requires verbal cues;Requires tactile cues General Comments: extremely tangential, unable to follow multi-step tasks, see note for further detail        Exercises     Shoulder Instructions       General Comments      Pertinent Vitals/ Pain       Pain Assessment: No/denies pain  Home Living                                          Prior Functioning/Environment               Frequency  Min 2X/week        Progress Toward Goals  OT Goals(current goals can now be found in the care plan section)  Progress towards OT goals: Progressing toward goals  Acute Rehab OT Goals Patient Stated Goal: return home OT Goal Formulation: With patient/family Time For Goal Achievement: 03/02/20 Potential to Achieve Goals: Good  Plan Discharge plan needs to be updated    Co-evaluation                 AM-PAC OT "6 Clicks" Daily Activity     Outcome Measure   Help from another person eating meals?: A Little Help from another person taking care of personal grooming?: A Little Help from another person toileting, which includes using toliet, bedpan, or urinal?: A Little Help from another person bathing (including washing, rinsing, drying)?: A Little Help from another person to put on and taking off regular upper body clothing?: A Little Help from another person to put on and taking off regular lower body clothing?: A Little 6 Click Score: 18    End of Session    OT Visit Diagnosis: Unsteadiness on feet (R26.81);Other abnormalities of gait and mobility (R26.89);Pain;Muscle weakness (generalized) (M62.81);Other symptoms and signs involving cognitive function   Activity Tolerance Patient tolerated treatment well   Patient Left in chair;with call bell/phone within reach;with family/visitor present   Nurse Communication Mobility status        Time: 1607-3710 OT Time Calculation (min): 28 min  Charges: OT General Charges $OT Visit: 1 Visit OT Treatments $Therapeutic Activity: 23-37 mins  Pollyann Glen E. Avannah Decker, COTA/L Acute Rehabilitation Services [phone]   Cherlyn Cushing 02/13/2020, 2:10 PM

## 2020-02-13 NOTE — Progress Notes (Signed)
Patient ID: Kristina Nichols, female   DOB: [birth year], 54 y.o.   MRN: [id number]     Advanced Heart Failure Rounding Note  PCP-Cardiologist: No primary care provider on file.   Subjective:    10/25 S/P IABP.  Temp pacer removed. 10/27 IABP removed.  10/28 Extubated. Placed back on Bipap for several hours. Given solumedrol and later weaned to 4 liters. Failed swallow study.  10/29 Passed swallow eval, Swan out.   No complaints, no dyspnea or chest pain.  No further VT.     Objective:   Weight Range: 74.7 kg Body mass index is 27.4 kg/m.   Vital Signs:   Temp:  [98.5 F (36.9 C)-99.1 F (37.3 C)] 98.5 F (36.9 C) (11/01 0007) Pulse Rate:  [82-101] 82 (11/01 0007) Resp:  [13-26] 18 (11/01 0007) BP: (101-138)/(64-83) 105/64 (11/01 0007) SpO2:  [92 %-100 %] 96 % (11/01 0007) Last BM Date: 02/12/20  Weight change: Filed Weights   02/10/20 0600 02/11/20 0500 02/12/20 0500  Weight: 76.7 kg 76.2 kg 74.7 kg    Intake/Output:   Intake/Output Summary (Last 24 hours) at 02/13/2020 0742 Last data filed at 02/12/2020 1800 Gross per 24 hour  Intake 380.84 ml  Output 0 ml  Net 380.84 ml      Physical Exam   General: NAD Neck: No JVD, no thyromegaly or thyroid nodule.  Lungs: Clear to auscultation bilaterally with normal respiratory effort. CV: Nondisplaced PMI.  Heart regular S1/S2, no S3/S4, no murmur.  No peripheral edema.   Abdomen: Soft, nontender, no hepatosplenomegaly, no distention.  Skin: Intact without lesions or rashes.  Neurologic: Alert and oriented x 3.  Psych: Normal affect with pressured speech. Extremities: No clubbing or cyanosis.  HEENT: Normal.    Telemetry   NSR with occasional PVCs, no VT (personally reviewed).   EKG    N/a   Labs    CBC Recent Labs    02/11/20 1335 02/12/20 0227  WBC 7.2 6.9  HGB 11.3* 11.0*  HCT 35.7* 34.8*  MCV 96.2 95.6  PLT 203 216   Basic Metabolic Panel Recent Labs    71/24/58 1335 02/12/20 0227  NA 137  137  K 4.0 3.8  CL 97* 99  CO2 31 30  GLUCOSE 125* 131*  BUN 17 14  CREATININE 0.61 0.52  CALCIUM 9.2 9.1  MG 1.8 1.9   Liver Function Tests Recent Labs    02/11/20 1335 02/12/20 0227  AST 53* 39  ALT 101* 86*  ALKPHOS 135* 124  BILITOT 0.5 0.9  PROT 6.4* 6.1*  ALBUMIN 3.2* 3.1*   No results for input(s): LIPASE, AMYLASE in the last 72 hours. Cardiac Enzymes No results for input(s): CKTOTAL, CKMB, CKMBINDEX, TROPONINI in the last 72 hours.  BNP: BNP (last 3 results) Recent Labs    02/11/20 1335  BNP 88.4    ProBNP (last 3 results) No results for input(s): PROBNP in the last 8760 hours.   D-Dimer No results for input(s): DDIMER in the last 72 hours. Hemoglobin A1C No results for input(s): HGBA1C in the last 72 hours. Fasting Lipid Panel No results for input(s): CHOL, HDL, LDLCALC, TRIG, CHOLHDL, LDLDIRECT in the last 72 hours. Thyroid Function Tests No results for input(s): TSH, T4TOTAL, T3FREE, THYROIDAB in the last 72 hours.  Invalid input(s): FREET3  Other results:   Imaging    No results found.   Medications:     Scheduled Medications:  buPROPion  100 mg Oral TID   carvedilol  3.125  mg Oral BID WC   Chlorhexidine Gluconate Cloth  6 each Topical Q0600   digoxin  0.125 mg Oral Daily   docusate sodium  100 mg Oral BID   enoxaparin (LOVENOX) injection  40 mg Subcutaneous Q24H   lamoTRIgine  150 mg Oral BID   mouth rinse  15 mL Mouth Rinse BID   mexiletine  150 mg Oral Q8H   pantoprazole  40 mg Oral Daily   sacubitril-valsartan  1 tablet Oral BID   spironolactone  25 mg Oral Daily    Infusions:  sodium chloride Stopped (02/10/20 2015)    PRN Medications: acetaminophen, clonazepam, influenza vac split quadrivalent PF, levalbuterol, polyethylene glycol, polyvinyl alcohol    Patient Profile  Kristina Nichols is a 54 year old with h/o witnessed cardiac arrest at homewith refractory vib/ torsades. Intubated in ER. Taken [facility]  with clean coronaries, findings consistent with stress induced cardiomyopathy, LVEF <25%.   Cardiogenic shock/torsades. Multiple shocks.   Assessment/Plan   1. VT: Recurrent torsades with multiple shocks, also triggered by RV pacing from temporary wire (removed). Coronaries normal. Amiodarone stopped due to prolonged QT interval, on lidocaine now to mexiletine.  No further VT. I suspect that prolonged QT is the sequelae of stress (Takotsubo-type) cardiomyopathy.  - Continue mexiletine.  - Continue Coreg.  - Cardiac MRI to assess for infiltrative disease => today.  - ICD for secondary prevention, clinically ready for device implantation, timing per EP => Dr. Lalla Brothers to discuss with her today, she is willing to have ICD placement.  2. Acute systolic CHF/cardiogenic shock: EF <20% on echo (no LV thrombus), initial cath with normal coronaries. Concern for stress (Takotsubo-type) cardiomyopathy. Recent emotional stress. Initially required IABP and norepinephrine, now weaned off and stable.  Not volume overloaded on exam. SBP 100s, do not think she has a lot of room for med titration today.  - Continue digoxin.   - Continue Coreg 3.125 mg bid   - Continue spironolactone 25 mg daily.  - Continue Entresto 24/26 bid.   - Cardiac MRI today.   3. Neuro: Seems coherent but generally very anxious.   4. Acute hypoxemic respiratory failure: On vent for several days. CXR with bilateral patchy infiltrates, ?aspiration in setting of cardiac arrest. Now resolved, extubated, CXR has cleared.  Off abx.  5. Elevated LFTs: Suspect shock liver, trending down.  6. ID: Initially treated with abx for PNA, now off.   7. Thrombocytopenia: Suspect due to IABP, resolved  Continue to mobilize.  She will be ready for home after ICD placed.   Length of Stay: 8  Marca Ancona, MD  02/13/2020, 7:42 AM  Advanced Heart Failure Team Pager [phone] (M-F; 7a - 4p)  Please contact [facility] for night-coverage  after hours (4p -7a ) and weekends on [email or web address]

## 2020-02-14 ENCOUNTER — Inpatient Hospital Stay (HOSPITAL_COMMUNITY)
Admission: EM | Disposition: A | Discharge status: Home / Self Care | Payer: Self-pay | Source: Home / Self Care | Attending: Pulmonary Disease

## 2020-02-14 DIAGNOSIS — I428 Other cardiomyopathies: Secondary | ICD-10-CM

## 2020-02-14 DIAGNOSIS — I4901 Ventricular fibrillation: Secondary | ICD-10-CM

## 2020-02-14 HISTORY — PX: ICD IMPLANT: EP1208

## 2020-02-14 LAB — CBC
HCT: 36.3 % (ref 36.0–46.0)
Hemoglobin: 11.7 g/dL — ABNORMAL LOW (ref 12.0–15.0)
MCH: 31.4 pg (ref 26.0–34.0)
MCHC: 32.2 g/dL (ref 30.0–36.0)
MCV: 97.3 fL (ref 80.0–100.0)
Platelets: 276 10*3/uL (ref 150–400)
RBC: 3.73 MIL/uL — ABNORMAL LOW (ref 3.87–5.11)
RDW: 13.2 % (ref 11.5–15.5)
WBC: 7.9 10*3/uL (ref 4.0–10.5)
nRBC: 0 % (ref 0.0–0.2)

## 2020-02-14 LAB — COMPREHENSIVE METABOLIC PANEL
ALT: 78 U/L — ABNORMAL HIGH (ref 0–44)
AST: 38 U/L (ref 15–41)
Albumin: 3.3 g/dL — ABNORMAL LOW (ref 3.5–5.0)
Alkaline Phosphatase: 135 U/L — ABNORMAL HIGH (ref 38–126)
Anion gap: 11 (ref 5–15)
BUN: 14 mg/dL (ref 6–20)
CO2: 28 mmol/L (ref 22–32)
Calcium: 9.5 mg/dL (ref 8.9–10.3)
Chloride: 100 mmol/L (ref 98–111)
Creatinine, Ser: 0.71 mg/dL (ref 0.44–1.00)
GFR, Estimated: >60 mL/min (ref >60)
Glucose, Bld: 109 mg/dL — ABNORMAL HIGH (ref 70–99)
Potassium: 4.3 mmol/L (ref 3.5–5.1)
Sodium: 139 mmol/L (ref 135–145)
Total Bilirubin: 0.6 mg/dL (ref 0.3–1.2)
Total Protein: 6.4 g/dL — ABNORMAL LOW (ref 6.5–8.1)

## 2020-02-14 LAB — SURGICAL PCR SCREEN
MRSA, PCR: NEGATIVE
Staphylococcus aureus: NEGATIVE

## 2020-02-14 LAB — MAGNESIUM: Magnesium: 2 mg/dL (ref 1.7–2.4)

## 2020-02-14 SURGERY — ICD IMPLANT

## 2020-02-14 MED ORDER — MIDAZOLAM HCL 5 MG/5ML IJ SOLN
INTRAMUSCULAR | Status: AC
  Filled 2020-02-14: qty 5

## 2020-02-14 MED ORDER — CEFAZOLIN SODIUM-DEXTROSE 2-4 GM/100ML-% IV SOLN
2.0000 g | INTRAVENOUS | Status: AC
  Administered 2020-02-14: 2 g via INTRAVENOUS

## 2020-02-14 MED ORDER — SODIUM CHLORIDE 0.9 % IV SOLN: INTRAVENOUS | Status: DC

## 2020-02-14 MED ORDER — CEFAZOLIN SODIUM-DEXTROSE 2-4 GM/100ML-% IV SOLN
INTRAVENOUS | Status: AC
  Filled 2020-02-14: qty 100

## 2020-02-14 MED ORDER — HEPARIN (PORCINE) IN NACL 1000-0.9 UT/500ML-% IV SOLN
INTRAVENOUS | Status: AC
  Filled 2020-02-14: qty 500

## 2020-02-14 MED ORDER — CHLORHEXIDINE GLUCONATE 4 % EX LIQD
CUTANEOUS | Status: AC
  Administered 2020-02-14: 4 via TOPICAL
  Filled 2020-02-14: qty 15

## 2020-02-14 MED ORDER — SODIUM CHLORIDE 0.9 % IV SOLN
80.0000 mg | INTRAVENOUS | Status: AC
  Administered 2020-02-14: 80 mg

## 2020-02-14 MED ORDER — MIDAZOLAM HCL 5 MG/5ML IJ SOLN
INTRAMUSCULAR | Status: DC | PRN
  Administered 2020-02-14: 2 mg via INTRAVENOUS
  Administered 2020-02-14: 1 mg via INTRAVENOUS
  Administered 2020-02-14: 2 mg via INTRAVENOUS
  Administered 2020-02-14 (×3): 1 mg via INTRAVENOUS
  Administered 2020-02-14: 2 mg via INTRAVENOUS
  Administered 2020-02-14: 1 mg via INTRAVENOUS
  Administered 2020-02-14 (×2): 2 mg via INTRAVENOUS

## 2020-02-14 MED ORDER — FENTANYL CITRATE (PF) 100 MCG/2ML IJ SOLN
INTRAMUSCULAR | Status: DC | PRN
  Administered 2020-02-14 (×2): 12.5 ug via INTRAVENOUS
  Administered 2020-02-14 (×3): 25 ug via INTRAVENOUS
  Administered 2020-02-14 (×2): 12.5 ug via INTRAVENOUS
  Administered 2020-02-14: 25 ug via INTRAVENOUS

## 2020-02-14 MED ORDER — CEFAZOLIN SODIUM-DEXTROSE 1-4 GM/50ML-% IV SOLN
1.0000 g | Freq: Four times a day (QID) | INTRAVENOUS | Status: AC
  Administered 2020-02-14 – 2020-02-15 (×3): 1 g via INTRAVENOUS
  Filled 2020-02-14 (×5): qty 50

## 2020-02-14 MED ORDER — FENTANYL CITRATE (PF) 100 MCG/2ML IJ SOLN
INTRAMUSCULAR | Status: AC
  Filled 2020-02-14: qty 2

## 2020-02-14 MED ORDER — ACETAMINOPHEN 325 MG PO TABS: 325.0000 mg | ORAL_TABLET | ORAL | Status: DC | PRN

## 2020-02-14 MED ORDER — LIDOCAINE HCL (PF) 1 % IJ SOLN
INTRAMUSCULAR | Status: DC | PRN
  Administered 2020-02-14: 50 mL

## 2020-02-14 MED ORDER — ONDANSETRON HCL 4 MG/2ML IJ SOLN: 4.0000 mg | Freq: Four times a day (QID) | INTRAMUSCULAR | Status: DC | PRN

## 2020-02-14 MED ORDER — SODIUM CHLORIDE 0.9 % IV SOLN
INTRAVENOUS | Status: AC
  Filled 2020-02-14: qty 2

## 2020-02-14 MED ORDER — LIDOCAINE HCL (PF) 1 % IJ SOLN
INTRAMUSCULAR | Status: AC
  Filled 2020-02-14: qty 60

## 2020-02-14 MED ORDER — CHLORHEXIDINE GLUCONATE 4 % EX LIQD: 60.0000 mL | Freq: Once | CUTANEOUS | Status: AC

## 2020-02-14 MED ORDER — ACETAMINOPHEN 325 MG PO TABS: 650.0000 mg | ORAL_TABLET | ORAL | Status: DC | PRN

## 2020-02-14 SURGICAL SUPPLY — 6 items
CABLE SURGICAL S-101-97-12 (CABLE) ×3 IMPLANT
ICD GALLANT VR CDVRA500Q (ICD Generator) ×3 IMPLANT
LEAD DURATA 7122Q-58CM (Lead) ×3 IMPLANT
PAD PRO RADIOLUCENT 2001M-C (PAD) ×3 IMPLANT
SHEATH 7FR PRELUDE SNAP 13 (SHEATH) ×3 IMPLANT
TRAY PACEMAKER INSERTION (PACKS) ×3 IMPLANT

## 2020-02-14 NOTE — Progress Notes (Signed)
Physical Therapy Treatment Patient Details Name: Kristina Nichols MRN: [id number] DOB: 1965/12/17 Today's Date: 02/14/2020    History of Present Illness Pt is a 54 year old woman admitted on 02/05/20 with stress induced cardiomyopathy resulting in recurrent v-fib and torsades arrests. Intubated in ED, extubated 02/09/20. IABP 10/25-10/27. PMH: anxiety, depression, hypothyroidism, HLD, gastroparesis.    PT Comments    Pt continues to make good progress with mobility. Currently recommend rollator and OPPT for higher level balance activities. She may quickly progress past both of these.    Follow Up Recommendations  Outpatient PT;Supervision/Assistance - 24 hour     Equipment Recommendations  Other (comment) (rollator)    Recommendations for Other Services       Precautions / Restrictions Precautions Precautions: Fall    Mobility  Bed Mobility Overal bed mobility: Modified Independent Bed Mobility: Supine to Sit;Sit to Supine     Supine to sit: Modified independent (Device/Increase time) Sit to supine: Modified independent (Device/Increase time)      Transfers Overall transfer level: Modified independent Equipment used: None Transfers: Sit to/from Stand Sit to Stand: Modified independent (Device/Increase time)         General transfer comment: Able to rise from bed and from toilet without difficulty  Ambulation/Gait Ambulation/Gait assistance: Supervision;Min guard Gait Distance (Feet): 470 Feet Assistive device: None;4-wheeled walker Gait Pattern/deviations: Step-through pattern;Decreased stride length;Drifts right/left   Gait velocity interpretation: >2.62 ft/sec, indicative of community ambulatory General Gait Details: slight unsteadiness at times but no overt loss of balance. Primarily unsteadiness in busy environment with many distractions.    Stairs Stairs: Yes Stairs assistance: Min guard Stair Management: One rail Right;Step to pattern;Forwards Number  of Stairs: 7 General stair comments: Used portable stair next to sink to simulate rail. Went up/down single step x 7    Wheelchair Mobility    Modified Rankin (Stroke Patients Only)       Balance Overall balance assessment: Needs assistance Sitting-balance support: No upper extremity supported Sitting balance-Leahy Scale: Normal     Standing balance support: No upper extremity supported;During functional activity Standing balance-Leahy Scale: Good                              Cognition Arousal/Alertness: Awake/alert Behavior During Therapy: Anxious                       Current Attention Level: Sustained           General Comments: tangential speech      Exercises      General Comments General comments (skin integrity, edema, etc.): VSS on RA      Pertinent Vitals/Pain Pain Assessment: No/denies pain    Home Living                      Prior Function            PT Goals (current goals can now be found in the care plan section) Progress towards PT goals: Progressing toward goals    Frequency    Min 3X/week      PT Plan Discharge plan needs to be updated;Equipment recommendations need to be updated    Co-evaluation              AM-PAC PT "6 Clicks" Mobility   Outcome Measure  Help needed turning from your back to your side while in a flat bed without using bedrails?: None  Help needed moving from lying on your back to sitting on the side of a flat bed without using bedrails?: None Help needed moving to and from a bed to a chair (including a wheelchair)?: None Help needed standing up from a chair using your arms (e.g., wheelchair or bedside chair)?: None Help needed to walk in hospital room?: A Little Help needed climbing 3-5 steps with a railing? : A Little 6 Click Score: 22    End of Session   Activity Tolerance: Patient tolerated treatment well Patient left: with family/visitor present;in bed;with call  bell/phone within reach   PT Visit Diagnosis: Muscle weakness (generalized) (M62.81);Other abnormalities of gait and mobility (R26.89)     Time: 8657-8469 PT Time Calculation (min) (ACUTE ONLY): 21 min  Charges:  $Gait Training: 8-22 mins                     [facility] PT Acute Rehabilitation Services Pager [phone] Office [phone]    Angelina Ok [facility] 02/14/2020, 10:27 AM

## 2020-02-14 NOTE — Progress Notes (Signed)
Patient ID: Kristina Nichols, female   DOB: 1966-03-06, 54 y.o.   MRN: [id number]     Advanced Heart Failure Rounding Note  PCP-Cardiologist: No primary care provider on file.   Subjective:    10/25 S/P IABP.  Temp pacer removed. 10/27 IABP removed.  10/28 Extubated. Placed back on Bipap for several hours. Given solumedrol and later weaned to 4 liters. Failed swallow study.  10/29 Passed swallow eval, Swan out.   No complaints, no chest pain or dyspnea.    Cardiac MRI: 1.  Normal LV size and systolic function, EF 56%. 2.  Normal RV size and systolic function, EF 47%. 3. No myocardial LGE, so no definitive evidence for prior MI, myocarditis, or infiltrative disease.   Objective:   Weight Range: 72.6 kg Body mass index is 26.64 kg/m.   Vital Signs:   Temp:  [98.2 F (36.8 C)-98.6 F (37 C)] 98.4 F (36.9 C) (11/02 0545) Pulse Rate:  [86-100] 86 (11/01 2100) Resp:  [18] 18 (11/02 0545) BP: (105-116)/(65-83) 105/65 (11/02 0545) SpO2:  [99 %-100 %] 100 % (11/02 0545) Weight:  [72.6 kg] 72.6 kg (11/02 0500) Last BM Date: 02/13/20  Weight change: Filed Weights   02/11/20 0500 02/12/20 0500 02/14/20 0500  Weight: 76.2 kg 74.7 kg 72.6 kg    Intake/Output:   Intake/Output Summary (Last 24 hours) at 02/14/2020 0819 Last data filed at 02/13/2020 1452 Gross per 24 hour  Intake 240 ml  Output --  Net 240 ml      Physical Exam   General: NAD Neck: No JVD, no thyromegaly or thyroid nodule.  Lungs: Clear to auscultation bilaterally with normal respiratory effort. CV: Nondisplaced PMI.  Heart regular S1/S2, no S3/S4, no murmur.  No peripheral edema.  Abdomen: Soft, nontender, no hepatosplenomegaly, no distention.  Skin: Intact without lesions or rashes.  Neurologic: Alert and oriented x 3.  Psych: Normal affect. Extremities: No clubbing or cyanosis.  HEENT: Normal.    Telemetry   NSR with occasional PVCs, no VT (personally reviewed).   EKG    N/a   Labs     CBC Recent Labs    02/12/20 0227 02/14/20 0227  WBC 6.9 7.9  HGB 11.0* 11.7*  HCT 34.8* 36.3  MCV 95.6 97.3  PLT 216 276   Basic Metabolic Panel Recent Labs    34/19/62 0227 02/12/20 0227 02/13/20 1108 02/14/20 0227  NA 137   < > 137 139  K 3.8   < > 4.0 4.3  CL 99   < > 101 100  CO2 30   < > 23 28  GLUCOSE 131*   < > 142* 109*  BUN 14   < > 13 14  CREATININE 0.52   < > 0.67 0.71  CALCIUM 9.1   < > 9.2 9.5  MG 1.9  --   --  2.0   < > = values in this interval not displayed.   Liver Function Tests Recent Labs    02/12/20 0227 02/14/20 0227  AST 39 38  ALT 86* 78*  ALKPHOS 124 135*  BILITOT 0.9 0.6  PROT 6.1* 6.4*  ALBUMIN 3.1* 3.3*   No results for input(s): LIPASE, AMYLASE in the last 72 hours. Cardiac Enzymes No results for input(s): CKTOTAL, CKMB, CKMBINDEX, TROPONINI in the last 72 hours.  BNP: BNP (last 3 results) Recent Labs    02/11/20 1335  BNP 88.4    ProBNP (last 3 results) No results for input(s): PROBNP in the last  8760 hours.   D-Dimer No results for input(s): DDIMER in the last 72 hours. Hemoglobin A1C No results for input(s): HGBA1C in the last 72 hours. Fasting Lipid Panel No results for input(s): CHOL, HDL, LDLCALC, TRIG, CHOLHDL, LDLDIRECT in the last 72 hours. Thyroid Function Tests No results for input(s): TSH, T4TOTAL, T3FREE, THYROIDAB in the last 72 hours.  Invalid input(s): FREET3  Other results:   Imaging    MR CARDIAC MORPHOLOGY W WO CONTRAST  Result Date: 02/13/2020 CLINICAL DATA:  Cardiomyopathy of uncertain etiology EXAM: CARDIAC MRI TECHNIQUE: The patient was scanned on a 1.5 Tesla GE magnet. A dedicated cardiac coil was used. Functional imaging was done using Fiesta sequences. 2,3, and 4 chamber views were done to assess for RWMA's. Modified Simpson's rule using a short axis stack was used to calculate an ejection fraction on a dedicated work [employer]. The patient received 8 cc of  Gadavist. After 10 minutes inversion recovery sequences were used to assess for infiltration and scar tissue. FINDINGS: Fluid in fissure on right, otherwise no gross lung abnormalities. Normal left ventricular size and wall thickness, EF 56% with normal wall motion. Normal right ventricular size and systolic function, EF 47%. Mild biatrial enlargement. Trileaflet aortic valve, no significant regurgitation or stenosis. No significant mitral regurgitation noted. On delayed enhancement imaging, there was no myocardial late gadolinium enhancement (LGE). Measurements: LVEDV 113 mL LVSV 63 mL LVEF 56% RVEDV 95 mL RVSV 44 mL RVEF 47% IMPRESSION: 1.  Normal LV size and systolic function, EF 56%. 2.  Normal RV size and systolic function, EF 47%. 3. No myocardial LGE, so no definitive evidence for prior MI, myocarditis, or infiltrative disease. Significant improvement in LV systolic function compared to prior echo. Suggestive of recovery of Takotsubo cardiomyopathy. Maksymilian Mabey Electronically Signed   By: Marca Ancona M.D.   On: 02/13/2020 13:23     Medications:     Scheduled Medications: . buPROPion  100 mg Oral TID  . carvedilol  3.125 mg Oral BID WC  . chlorhexidine  60 mL Topical Once  . docusate sodium  100 mg Oral BID  . enoxaparin (LOVENOX) injection  40 mg Subcutaneous Q24H  . gentamicin irrigation  80 mg Irrigation On Call  . lamoTRIgine  150 mg Oral BID  . mouth rinse  15 mL Mouth Rinse BID  . mexiletine  150 mg Oral Q8H  . pantoprazole  40 mg Oral Daily  . sacubitril-valsartan  1 tablet Oral BID  . spironolactone  25 mg Oral Daily    Infusions: . sodium chloride Stopped (02/10/20 2015)  . sodium chloride    . sodium chloride    .  ceFAZolin (ANCEF) IV      PRN Medications: acetaminophen, clonazepam, influenza vac split quadrivalent PF, levalbuterol, polyethylene glycol, polyvinyl alcohol    Patient Profile  Ms Steuck is a 54 year old with h/o witnessed cardiac arrest at  homewith refractory vib/ torsades. Intubated in ER. Taken for [facility] with clean coronaries, findings consistent with stress induced cardiomyopathy, LVEF <25%.   Cardiogenic shock/torsades. Multiple shocks.   Assessment/Plan   1. VT: Recurrent torsades with multiple shocks, also triggered by RV pacing from temporary wire (removed). Coronaries normal. Amiodarone stopped due to prolonged QT interval, on lidocaine now to mexiletine.  No further VT. I suspect that prolonged QT is the sequelae of stress (Takotsubo-type) cardiomyopathy.  - Continue mexiletine.  - Continue Coreg.  - Will get repeat ECG - ICD for secondary prevention today.  Her EF has recovered on cMRI but suspect that her future risk is not low.  2. Acute systolic CHF/cardiogenic shock: EF <20% on echo (no LV thrombus), initial cath with normal coronaries. Concern for stress (Takotsubo-type) cardiomyopathy. Recent emotional stress. Initially required IABP and norepinephrine, now weaned off and stable.  Cardiac MRI done yesterday showed LV EF back up to normal range (EF 56%) with no delayed enhancement => this is supportive of stress cardiomyopathy.  Not volume overloaded on exam.  - Digoxin stopped with recovery of EF.  - Continue current doses of spironolactone, Coreg, and Entresto for now.  If followup echo in 3 months or so remains normal EF, will probably drop Entresto but continue the other two.    3. Psych/neuro: Significant anxiety at baseline.  4. Acute hypoxemic respiratory failure: On vent for several days. CXR with bilateral patchy infiltrates, ?aspiration in setting of cardiac arrest. Now resolved, extubated, CXR has cleared.  Off abx.  5. Elevated LFTs: Suspect shock liver, trending down.  6. ID: Initially treated with abx for PNA, now off.   7. Thrombocytopenia: Suspect due to IABP, resolved  Continue to mobilize.  She will be ready for home after ICD placed.   Length of Stay: 9  Marca Ancona, MD   02/14/2020, 8:19 AM  Advanced Heart Failure Team Pager [phone] (M-F; 7a - 4p)  Please contact [facility] for night-coverage after hours (4p -7a ) and weekends on [email or web address]

## 2020-02-14 NOTE — Progress Notes (Signed)
PROGRESS NOTE                                                                                                                                                                                                             Patient Demographics:    Kristina Nichols, is a 54 y.o. female, DOB - 1965/11/21, ZSW:[id number]  Outpatient Primary MD for the patient is Patient, No Pcp Per    LOS - 9  Admit date - 02/05/2020    Chief Complaint  Patient presents with  . post cpr       Brief Narrative (HPI from H&P) 54 yo woman here after witnessed cardiac arrest at home with refractory vib/ torsades.  Intubated in ER.  Taken for [facility] with clean coronaries, findings consistent with stress induced cardiomyopathy, LVEF < 25%, she was briefly intubated for 4 days extubated on 02/09/2020, thereafter required brief BiPAP, she was stabilized and transferred to hospitalist service on 02/11/2020.   Subjective:   Patient in bed, appears comfortable, denies any headache, no fever, no chest pain or pressure, no shortness of breath , no abdominal pain. No focal weakness.   Assessment  & Plan :     1. Cardiac arrest due to V. fib and torsades with acute systolic heart failure EF 25% due to most likely Takotsubo cardiomyopathy with no significant CAD on cath.  Causing acute hypoxic respiratory failure requiring intubation for 4 days, respiratory failure has resolved, she has been seen by cardiology and PCCM, underwent left and right heart cath, briefly also required intra-aortic balloon pump.  Cardiology following she required amiodarone drip and lidocaine drip, she is now on mexiletine, digoxin, Aldactone, ARB along with Coreg, Cardiology and electrophysiology group is following the patient, underwent cardiac MRI 02/13/2020, cardiology planning to place AICD on 02/14/2020.  Thereafter likely discharge in a day or 2.  By cardiology.    2.  Acute ICU  encephalopathy.  Gradually improving, no focal deficits, overall mildly anxious, also has underlying depression, CT head nonacute.  Continue supportive care.  Minimize benzos and narcotics.  3.  Dysphagia - on D2- NT, SLP following.  4.  Anxiety and depression.  Not suicidal homicidal, cautiously resume home medications if they are not QT prolonging.  We will stop Vyvanse per pharmacy as can prolong QT, resume Wellbutrin, Lamictal and as needed Klonopin.  5.  Normocytic normochromic anemia.  Likely due to frequent blood draws.  Monitor.     Condition - Extremely Guarded  Family Communication  :  Husband bedside 02/11/20, 02/13/20  Code Status :  Full  Consults  :  PCCM, Cards  Procedures  :    10/24 ETT >> 10/28 10/24 R femoral sheath >>10/25, temp pacer removed; IABP inserted >> 10/25 R IJ cordis with PA catheter >>   c MRI -    1. Normal LV size and systolic function, EF 56%. 2. Normal RV size and systolic function, EF 47%. 3. No myocardial LGE, so no definitive evidence for prior MI, myocarditis, or infiltrative disease.   TTE - 1. No LV thrombus visualized, thought cannot excluded given LV substrate. Left ventricular ejection fraction, by estimation, is <20%. The left ventricle has severely decreased function. The left ventricle demonstrates global hypokinesis. Left ventricular diastolic parameters are consistent with Grade I diastolic dysfunction (impaired relaxation).  2. Right ventricular systolic function is normal. The right ventricular size is normal.  3. The mitral valve is grossly normal. No evidence of mitral valve regurgitation.  4. The aortic valve was not well visualized. Aortic valve regurgitation is trivial.  5. Aortic IABP in Descending Aorta.  6. The inferior vena cava is dilated in size with <50% respiratory variability, suggesting right atrial pressure of 15 mmHg.   SW.Ganz - 1. Mildly elevated left and right heart filling pressures. 2. Low cardiac output by  thermodilution on norepinephrine 4, preserved output by Fick. 3. Successful IABP placement. 4. Swan left in place right IJ  CT Head - non acute   Cardiac cath 10/24 >>  There is severe left ventricular systolic dysfunction.  LV end diastolic pressure is moderately elevated.  The left ventricular ejection fraction is less than 25% by visual estimate.  There is no mitral valve regurgitation. 1. No angiographic evidence of CAD 2. Severe segmental LV systolic dysfunction with wall motion suggesting Takotsubo's cardiomyopathy (stress induced cardiomyopathy).  3. Elevated LVEDP 4. Ventricular fibrillation/Torsades-Temporary Pacemaker placed per request of admitting/consulting team in case of need of overdrive pacing tonight  10/25 RHC >> 1. Mildly elevated left and right heart filling pressures.  2. Low cardiac output by thermodilution on norepinephrine 4, preserved output by Fick.  3. Successful IABP placement.  4. Swan left in place right IJ.  RA mean 12 RV 31/13 PA 33/19, mean 24 PCWP mean 17 Oxygen saturations: PA 73% AO 100% Cardiac Output (Thermo) 3.53 Cardiac Index (Thermo) 1.95 PVR 2 WU Cardiac Output (Fick) 4.95 Cardiac Index (Fick) 2.74   PUD Prophylaxis :  PPI  Disposition Plan  :    Status is: Inpatient  Remains inpatient appropriate because:IV treatments appropriate due to intensity of illness or inability to take PO   Dispo: The patient is from: Home              Anticipated d/c is to: Home              Anticipated d/c date is: > 3 days              Patient currently is not medically stable to d/c.  DVT Prophylaxis  :  Lovenox   Lab Results  Component Value Date   PLT 276 02/14/2020    Diet :  Diet Order            Diet NPO time specified  Diet effective now  Inpatient Medications  Scheduled Meds: . buPROPion  100 mg Oral TID  . carvedilol  3.125 mg Oral BID WC  . docusate sodium  100 mg Oral BID  . enoxaparin  (LOVENOX) injection  40 mg Subcutaneous Q24H  . gentamicin irrigation  80 mg Irrigation On Call  . lamoTRIgine  150 mg Oral BID  . mouth rinse  15 mL Mouth Rinse BID  . mexiletine  150 mg Oral Q8H  . pantoprazole  40 mg Oral Daily  . sacubitril-valsartan  1 tablet Oral BID  . spironolactone  25 mg Oral Daily   Continuous Infusions: . sodium chloride Stopped (02/10/20 2015)  . sodium chloride    . sodium chloride    .  ceFAZolin (ANCEF) IV     PRN Meds:.acetaminophen, clonazepam, influenza vac split quadrivalent PF, levalbuterol, polyethylene glycol, polyvinyl alcohol  Antibiotics  :    Anti-infectives (From admission, onward)   Start     Dose/Rate Route Frequency Ordered Stop   02/14/20 0900  gentamicin (GARAMYCIN) 80 mg in sodium chloride 0.9 % 500 mL irrigation        80 mg Irrigation On call 02/14/20 0805 02/15/20 0900   02/14/20 0900  ceFAZolin (ANCEF) IVPB 2g/100 mL premix        2 g 200 mL/hr over 30 Minutes Intravenous On call 02/14/20 0805 02/15/20 0900   02/08/20 2100  vancomycin (VANCOCIN) IVPB 1000 mg/200 mL premix  Status:  Discontinued        1,000 mg 200 mL/hr over 60 Minutes Intravenous Every 12 hours 02/08/20 0754 02/09/20 0954   02/08/20 0945  Vancomycin (VANCOCIN) 1,500 mg in sodium chloride 0.9 % 500 mL IVPB  Status:  Discontinued        1,500 mg 250 mL/hr over 120 Minutes Intravenous  Once 02/08/20 0848 02/08/20 0848   02/08/20 0945  vancomycin (VANCOCIN) 1,500 mg in sodium chloride 0.9 % 250 mL IVPB  Status:  Discontinued        1,500 mg 125 mL/hr over 120 Minutes Intravenous  Once 02/08/20 0850 02/08/20 0851   02/08/20 0945  vancomycin (VANCOREADY) IVPB 1500 mg/300 mL        1,500 mg 150 mL/hr over 120 Minutes Intravenous  Once 02/08/20 0854 02/08/20 1142   02/08/20 0900  ceFEPIme (MAXIPIME) 2 g in sodium chloride 0.9 % 100 mL IVPB  Status:  Discontinued        2 g 200 mL/hr over 30 Minutes Intravenous Every 8 hours 02/08/20 0752 02/10/20 0939   02/08/20  0845  Vancomycin (VANCOCIN) 1,500 mg in sodium chloride 0.9 % 500 mL IVPB  Status:  Discontinued        1,500 mg 250 mL/hr over 120 Minutes Intravenous  Once 02/08/20 0752 02/08/20 0854       Time Spent in minutes  30   Susa Raring M.D on 02/14/2020 at 11:01 AM  To page go to [email or web address] - password [facility] [address]  [facility] -  Office  [phone]    See all Orders from today for further details    Objective:   Vitals:   02/13/20 1451 02/13/20 2100 02/14/20 0500 02/14/20 0545  BP: 105/83 116/69  105/65  Pulse: 88 86    Resp: 18 18  18   Temp: 98.2 F (36.8 C) 98.6 F (37 C)  98.4 F (36.9 C)  TempSrc: Oral Oral  Oral  SpO2: 99% 100%  100%  Weight:   72.6 kg   Height:  Wt Readings from Last 3 Encounters:  02/14/20 72.6 kg     Intake/Output Summary (Last 24 hours) at 02/14/2020 1101 Last data filed at 02/13/2020 1452 Gross per 24 hour  Intake 240 ml  Output --  Net 240 ml     Physical Exam  Awake Alert, No new F.N deficits, anxious affect [place].AT,PERRAL Supple Neck,No JVD, No cervical lymphadenopathy appriciated.  Symmetrical Chest wall movement, Good air movement bilaterally, CTAB RRR,No Gallops, Rubs or new Murmurs, No Parasternal Heave +ve B.Sounds, Abd Soft, No tenderness, No organomegaly appriciated, No rebound - guarding or rigidity. No Cyanosis, Clubbing or edema, No new Rash or bruise    Data Review:    CBC Recent Labs  Lab 02/09/20 0306 02/10/20 0214 02/11/20 1335 02/12/20 0227 02/14/20 0227  WBC 6.6 7.3 7.2 6.9 7.9  HGB 9.2* 9.9* 11.3* 11.0* 11.7*  HCT 29.0* 30.9* 35.7* 34.8* 36.3  PLT 68* 101* 203 216 276  MCV 97.0 96.6 96.2 95.6 97.3  MCH 30.8 30.9 30.5 30.2 31.4  MCHC 31.7 32.0 31.7 31.6 32.2  RDW 14.1 13.5 13.2 13.1 13.2    Recent Labs  Lab 02/07/20 1442 02/07/20 1442 02/08/20 0306 02/08/20 0306 02/09/20 0306 02/09/20 0306 02/10/20 0214 02/10/20 0330 02/10/20 1610 02/11/20 1335 02/12/20 0227 02/13/20 1108  02/14/20 0227  NA 136   < > 137   < > 137   < >  --   --  137 137 137 137 139  K 4.2   < > 3.9   < > 4.2   < >  --   --  4.4 4.0 3.8 4.0 4.3  CL 108   < > 107   < > 108   < >  --   --  101 97* 99 101 100  CO2 22   < > 21*   < > 23   < >  --   --  28 31 30 23 28   GLUCOSE 142*   < > 128*   < > 113*   < >  --   --  132* 125* 131* 142* 109*  BUN 12   < > 10   < > 15   < >  --   --  12 17 14 13 14   CREATININE 0.64   < > 0.63   < > 0.62   < >  --   --  0.49 0.61 0.52 0.67 0.71  CALCIUM 7.7*   < > 7.9*   < > 8.2*   < >  --   --  8.7* 9.2 9.1 9.2 9.5  AST 119*  --   --   --   --   --   --  46*  --  53* 39  --  38  ALT 210*  --   --   --   --   --   --  104*  --  101* 86*  --  78*  ALKPHOS 67  --   --   --   --   --   --  134*  --  135* 124  --  135*  BILITOT 0.6  --   --   --   --   --   --  0.7  --  0.5 0.9  --  0.6  ALBUMIN 2.8*  --   --   --   --   --   --  2.8*  --  3.2* 3.1*  --  3.3*  MG 2.3   < > 2.0   < > 1.8  --  2.0  --   --  1.8 1.9  --  2.0  PROCALCITON 0.10  --  <0.10  --   --   --   --   --   --   --   --   --   --   BNP  --   --   --   --   --   --   --   --   --  88.4  --   --   --    < > = values in this interval not displayed.    ------------------------------------------------------------------------------------------------------------------ No results for input(s): CHOL, HDL, LDLCALC, TRIG, CHOLHDL, LDLDIRECT in the last 72 hours.  Lab Results  Component Value Date   HGBA1C 5.3 02/06/2020   ------------------------------------------------------------------------------------------------------------------ No results for input(s): TSH, T4TOTAL, T3FREE, THYROIDAB in the last 72 hours.  Invalid input(s): FREET3  Cardiac Enzymes No results for input(s): CKMB, TROPONINI, MYOGLOBIN in the last 168 hours.  Invalid input(s): CK ------------------------------------------------------------------------------------------------------------------    Component Value Date/Time   BNP  88.4 02/11/2020 1335    Micro Results Recent Results (from the past 240 hour(s))  Resp Panel by RT PCR (RSV, Flu A&B, Covid) - Nasopharyngeal Swab     Status: None   Collection Time: 02/05/20  6:14 PM   Specimen: Nasopharyngeal Swab  Result Value Ref Range Status   SARS Coronavirus 2 by RT PCR NEGATIVE NEGATIVE Final    Comment: (NOTE) SARS-CoV-2 target nucleic acids are NOT DETECTED.  The SARS-CoV-2 RNA is generally detectable in upper respiratoy specimens during the acute phase of infection. The lowest concentration of SARS-CoV-2 viral copies this assay can detect is 131 copies/mL. A negative result does not preclude SARS-Cov-2 infection and should not be used as the sole basis for treatment or other patient management decisions. A negative result may occur with  improper specimen collection/handling, submission of specimen other than nasopharyngeal swab, presence of viral mutation(s) within the areas targeted by this assay, and inadequate number of viral copies (<131 copies/mL). A negative result must be combined with clinical observations, patient history, and epidemiological information. The expected result is Negative.  Fact Sheet for Patients:  https://www.moore.com/  Fact Sheet for Healthcare Providers:  https://www.young.biz/  This test is no t yet approved or cleared by the [place] FDA and  has been authorized for detection and/or diagnosis of SARS-CoV-2 by FDA under an Emergency Use Authorization (EUA). This EUA will remain  in effect (meaning this test can be used) for the duration of the COVID-19 declaration under Section 564(b)(1) of the Act, 21 U.S.C. section 360bbb-3(b)(1), unless the authorization is terminated or revoked sooner.     Influenza A by PCR NEGATIVE NEGATIVE Final   Influenza B by PCR NEGATIVE NEGATIVE Final    Comment: (NOTE) The Xpert Xpress SARS-CoV-2/FLU/RSV assay is intended as an aid in  the  diagnosis of influenza from Nasopharyngeal swab specimens and  should not be used as a sole basis for treatment. Nasal washings and  aspirates are unacceptable for Xpert Xpress SARS-CoV-2/FLU/RSV  testing.  Fact Sheet for Patients: https://www.moore.com/  Fact Sheet for Healthcare Providers: https://www.young.biz/  This test is not yet approved or cleared by the [place] FDA and  has been authorized for detection and/or diagnosis of SARS-CoV-2 by  FDA under an Emergency Use Authorization (EUA). This EUA will remain  in effect (meaning this test can  be used) for the duration of the  Covid-19 declaration under Section 564(b)(1) of the Act, 21  U.S.C. section 360bbb-3(b)(1), unless the authorization is  terminated or revoked.    Respiratory Syncytial Virus by PCR NEGATIVE NEGATIVE Final    Comment: (NOTE) Fact Sheet for Patients: https://www.moore.com/  Fact Sheet for Healthcare Providers: https://www.young.biz/  This test is not yet approved or cleared by the [place] FDA and  has been authorized for detection and/or diagnosis of SARS-CoV-2 by  FDA under an Emergency Use Authorization (EUA). This EUA will remain  in effect (meaning this test can be used) for the duration of the  COVID-19 declaration under Section 564(b)(1) of the Act, 21 U.S.C.  section 360bbb-3(b)(1), unless the authorization is terminated or  revoked. Performed at [facility], [address], [place], [place] [address]   MRSA PCR Screening     Status: None   Collection Time: 02/05/20 10:25 PM   Specimen: Nasopharyngeal  Result Value Ref Range Status   MRSA by PCR NEGATIVE NEGATIVE Final    Comment:        The GeneXpert MRSA Assay (FDA approved for NASAL specimens only), is one component of a comprehensive MRSA colonization surveillance program. It is not intended to diagnose MRSA infection nor to guide  or monitor treatment for MRSA infections. Performed at [facility], [address], [place], [place] [address]   Culture, blood (routine x 2)     Status: None   Collection Time: 02/07/20  8:21 AM   Specimen: BLOOD LEFT ARM  Result Value Ref Range Status   Specimen Description BLOOD LEFT ARM  Final   Special Requests   Final    BOTTLES DRAWN AEROBIC AND ANAEROBIC Blood Culture adequate volume   Culture   Final    NO GROWTH 5 DAYS Performed at [facility], [address], [place], [place] [address]    Report Status 02/12/2020 FINAL  Final  Culture, blood (routine x 2)     Status: None   Collection Time: 02/07/20  8:34 AM   Specimen: BLOOD  Result Value Ref Range Status   Specimen Description BLOOD LEFT ANTECUBITAL  Final   Special Requests   Final    BOTTLES DRAWN AEROBIC AND ANAEROBIC Blood Culture adequate volume   Culture   Final    NO GROWTH 5 DAYS Performed at [facility], [address], [place], [place] [address]    Report Status 02/12/2020 FINAL  Final  Culture, Urine     Status: None   Collection Time: 02/07/20  2:42 PM   Specimen: Urine, Catheterized  Result Value Ref Range Status   Specimen Description URINE, CATHETERIZED  Final   Special Requests NONE  Final   Culture   Final    NO GROWTH Performed at [facility], [address], [place], [place] [address]    Report Status 02/08/2020 FINAL  Final    Radiology Reports CT Head Wo Contrast  Result Date: 02/05/2020 CLINICAL DATA:  54 year old female with altered mental status post cardiac arrest. EXAM: CT HEAD WITHOUT CONTRAST TECHNIQUE: Contiguous axial images were obtained from the base of the skull through the vertex without intravenous contrast. COMPARISON:  None. FINDINGS: Brain: No evidence of acute infarction, hemorrhage, hydrocephalus, extra-axial collection or mass lesion/mass effect. Vascular: No hyperdense vessel or unexpected calcification. Skull: Normal. Negative for  fracture or focal lesion. Sinuses/Orbits: No acute finding. Other: Oral intubation noted. IMPRESSION: No evidence  of acute intracranial abnormality. Electronically Signed   By: Harmon Pier M.D.   On: 02/05/2020 18:48   CARDIAC CATHETERIZATION  Result Date: 02/06/2020 1. Mildly elevated left and right heart filling pressures. 2. Low cardiac output by thermodilution on norepinephrine 4, preserved output by Fick. 3. Successful IABP placement. 4. Swan left in place right IJ.   CARDIAC CATHETERIZATION  Result Date: 02/05/2020  There is severe left ventricular systolic dysfunction.  LV end diastolic pressure is moderately elevated.  The left ventricular ejection fraction is less than 25% by visual estimate.  There is no mitral valve regurgitation.  1. No angiographic evidence of CAD 2. Severe segmental LV systolic dysfunction with wall motion suggesting Takotsubo's cardiomyopathy (stress induced cardiomyopathy). 3. Elevated LVEDP 4. Ventricular fibrillation/Torsades-Temporary Pacemaker placed per request of admitting/consulting team in case of need of overdrive pacing tonight Recommendations: No further ischemic workup. Management of VF/Torsades per EP team in the am. Medical management of likely stress induced cardiomyopathy.   DG CHEST PORT 1 VIEW  Result Date: 02/08/2020 CLINICAL DATA:  Acute systolic heart failure. EXAM: PORTABLE CHEST 1 VIEW COMPARISON:  February 07, 2020. FINDINGS: The heart size and mediastinal contours are within normal limits. Endotracheal and nasogastric tubes are unchanged in position. Right internal jugular Swan-Ganz catheter is unchanged. No pneumothorax or pleural effusion is noted. Left lung is clear. Right upper lobe airspace opacity is nearly resolved. The visualized skeletal structures are unremarkable. IMPRESSION: Stable support apparatus. Right upper lobe airspace opacity is nearly resolved. Electronically Signed   By: Lupita Raider M.D.   On: 02/08/2020 08:36    DG CHEST PORT 1 VIEW  Result Date: 02/07/2020 CLINICAL DATA:  54 year old female with heart failure. EXAM: PORTABLE CHEST 1 VIEW COMPARISON:  Earlier radiograph dated 02/07/2020. FINDINGS: Endotracheal tube, enteric tube, and Swan-Ganz in similar position. There has been interval development of an area of opacity in the right upper lung field with overall decreased volume consistent with lobar atelectasis or collapse. Clinical correlation is recommended. No large pleural effusion. No pneumothorax. Stable cardiac silhouette. No acute osseous pathology. IMPRESSION: Interval development of lobar atelectasis or collapse of the right upper lung field. Follow-up recommended. Electronically Signed   By: Elgie Collard M.D.   On: 02/07/2020 18:20   DG Chest Port 1 View  Result Date: 02/07/2020 CLINICAL DATA:  Post cardiac arrest. EXAM: PORTABLE CHEST 1 VIEW COMPARISON:  02/05/2020 FINDINGS: Endotracheal tube in good position. NG tube enters the stomach with the tip not visualized. Involve placement of right jugular Swan-Ganz catheter. Swan-Ganz catheter tip right lower lobe pulmonary artery. No pneumothorax. Improvement in bilateral infiltrates compared with the prior study. No effusion. IMPRESSION: Swan-Ganz catheter tip right lower lobe pulmonary artery. No pneumothorax. Improvement in bilateral airspace disease likely edema. Electronically Signed   By: Marlan Palau M.D.   On: 02/07/2020 08:35   DG Chest Port 1 View  Result Date: 02/05/2020 CLINICAL DATA:  Status post CPR. EXAM: PORTABLE CHEST 1 VIEW COMPARISON:  February 05, 2020 (6:03 p.m.) FINDINGS: There is stable endotracheal tube and nasogastric tube positioning. Mild diffusely increased interstitial lung markings are seen with moderate to marked severity patchy left suprahilar and right upper lobe infiltrates. Mild bilateral lower lobe infiltrates are also seen. These areas are mildly increased in severity when compared to the prior study. The  heart size and mediastinal contours are within normal limits. The visualized skeletal structures are unremarkable. IMPRESSION: Moderate to marked severity bilateral patchy infiltrates, as described above, with mild interval  increase in severity when compared to the prior exam. Electronically Signed   By: Aram Candela M.D.   On: 02/05/2020 20:25   DG Chest Portable 1 View  Result Date: 02/05/2020 CLINICAL DATA:  Post intubation, witnessed cardiac arrest and CPR EXAM: PORTABLE CHEST 1 VIEW COMPARISON:  None FINDINGS: Endotracheal tube terminates between the clavicles approximately 4 cm above the carina. Gastric tube courses through in a off the field of the radiograph, side port in the region of the mid stomach, tip off the field of view. Pacer defibrillator pads project over the patient. Upper lobe opacities are present with some elevation of the minor fissure in the RIGHT chest. Less opacity at the LEFT lung apex. On limited assessment no acute skeletal process. IMPRESSION: 1. Endotracheal tube terminates between the clavicles approximately 4 cm above the carina. 2. Suggested volume loss and consolidative changes in the RIGHT upper lobe greater than LEFT upper lobe. Findings could be related to volume loss with pneumonitis from aspiration and potential background of asymmetric pulmonary edema or pneumonia. Underlying mass would be difficult to exclude. Follow-up after resolution of acute symptoms is suggested to exclude underlying lesion. 3. Gastric tube courses through a off the field of the radiograph, side port in the region of the mid stomach, tip off the field of view. Electronically Signed   By: Donzetta Kohut M.D.   On: 02/05/2020 18:20   MR CARDIAC MORPHOLOGY W WO CONTRAST  Result Date: 02/13/2020 CLINICAL DATA:  Cardiomyopathy of uncertain etiology EXAM: CARDIAC MRI TECHNIQUE: The patient was scanned on a 1.5 Tesla GE magnet. A dedicated cardiac coil was used. Functional imaging was done using  Fiesta sequences. 2,3, and 4 chamber views were done to assess for RWMA's. Modified Simpson's rule using a short axis stack was used to calculate an ejection fraction on a dedicated work [employer]. The patient received 8 cc of Gadavist. After 10 minutes inversion recovery sequences were used to assess for infiltration and scar tissue. FINDINGS: Fluid in fissure on right, otherwise no gross lung abnormalities. Normal left ventricular size and wall thickness, EF 56% with normal wall motion. Normal right ventricular size and systolic function, EF 47%. Mild biatrial enlargement. Trileaflet aortic valve, no significant regurgitation or stenosis. No significant mitral regurgitation noted. On delayed enhancement imaging, there was no myocardial late gadolinium enhancement (LGE). Measurements: LVEDV 113 mL LVSV 63 mL LVEF 56% RVEDV 95 mL RVSV 44 mL RVEF 47% IMPRESSION: 1.  Normal LV size and systolic function, EF 56%. 2.  Normal RV size and systolic function, EF 47%. 3. No myocardial LGE, so no definitive evidence for prior MI, myocarditis, or infiltrative disease. Significant improvement in LV systolic function compared to prior echo. Suggestive of recovery of Takotsubo cardiomyopathy. [name] Electronically Signed   By: Marca Ancona M.D.   On: 02/13/2020 13:23   ECHOCARDIOGRAM COMPLETE  Result Date: 02/06/2020    ECHOCARDIOGRAM REPORT   Patient Name:   [facility] Date of Exam: 02/06/2020 Medical Rec #:  [id number]    Height:       65.0 in Accession #:    [id number]   Weight:       161.8 lb Date of Birth:  1965-07-05   BSA:          1.808 m Patient Age:    53 years     BP:           136/80 mmHg Patient Gender: F  HR:           64 bpm. Exam Location:  Inpatient Procedure: 2D Echo Indications:    I46.9 cardiac arrest  History:        Patient has no prior history of Echocardiogram examinations.                 Risk Factors:Dyslipidemia. Temporary pacemaker.  Sonographer:    Celene Skeen RDCS (AE) Referring Phys: [id number] CHRISTOPHER D [facility]  Sonographer Comments: Suboptimal apical window and echo performed with patient supine and on artificial respirator. restricted mobility IMPRESSIONS  1. No LV thrombus visualized, thought cannot excluded given LV substrate. Left ventricular ejection fraction, by estimation, is <20%. The left ventricle has severely decreased function. The left ventricle demonstrates global hypokinesis. Left ventricular diastolic parameters are consistent with Grade I diastolic dysfunction (impaired relaxation).  2. Right ventricular systolic function is normal. The right ventricular size is normal.  3. The mitral valve is grossly normal. No evidence of mitral valve regurgitation.  4. The aortic valve was not well visualized. Aortic valve regurgitation is trivial.  5. Aortic IABP in Descending Aorta.  6. The inferior vena cava is dilated in size with <50% respiratory variability, suggesting right atrial pressure of 15 mmHg. Comparison(s): No prior Echocardiogram. Conclusion(s)/Recommendation(s): Severe LV Dysfunction; primary team aware. FINDINGS  Left Ventricle: No LV thrombus visualized, thought cannot excluded given LV substrate. Left ventricular ejection fraction, by estimation, is <20%. The left ventricle has severely decreased function. The left ventricle demonstrates global hypokinesis. The left ventricular internal cavity size was normal in size. There is no left ventricular hypertrophy. Left ventricular diastolic parameters are consistent with Grade I diastolic dysfunction (impaired relaxation). Right Ventricle: The right ventricular size is normal. No increase in right ventricular wall thickness. Right ventricular systolic function is normal. Left Atrium: Left atrial size was normal in size. Right Atrium: Right atrial size was normal in size. Pericardium: There is no evidence of pericardial effusion. Mitral Valve: The mitral valve is grossly normal. No evidence of  mitral valve regurgitation. Tricuspid Valve: The tricuspid valve is not well visualized. Tricuspid valve regurgitation is not demonstrated. Aortic Valve: The aortic valve was not well visualized. Aortic valve regurgitation is trivial. Pulmonic Valve: The pulmonic valve was not well visualized. Pulmonic valve regurgitation is not visualized. Aorta: IABP in Descending Aorta and the aortic root is normal in size and structure. Venous: The pulmonary veins were not well visualized. The inferior vena cava is dilated in size with less than 50% respiratory variability, suggesting right atrial pressure of 15 mmHg. IAS/Shunts: The atrial septum is grossly normal.  LEFT VENTRICLE PLAX 2D LVIDd:         4.40 cm  Diastology LVIDs:         3.60 cm  LV e' medial:    5.98 cm/s LV PW:         0.80 cm  LV E/e' medial:  11.3 LV IVS:        0.70 cm  LV e' lateral:   6.53 cm/s LVOT diam:     2.20 cm  LV E/e' lateral: 10.4 LV SV:         54 LV SV Index:   30 LVOT Area:     3.80 cm  RIGHT VENTRICLE RV S prime:     10.00 cm/s TAPSE (M-mode): 2.0 cm LEFT ATRIUM         Index LA diam:    2.80 cm 1.55 cm/m  AORTIC VALVE LVOT  Vmax:   72.50 cm/s LVOT Vmean:  59.300 cm/s LVOT VTI:    0.141 m  AORTA Ao Root diam: 2.80 cm MITRAL VALVE MV Area (PHT): 3.72 cm    SHUNTS MV Decel Time: 204 msec    Systemic VTI:  0.14 m MV E velocity: 67.70 cm/s  Systemic Diam: 2.20 cm MV A velocity: 63.80 cm/s MV E/A ratio:  1.06 Riley Lam MD Electronically signed by Riley Lam MD Signature Date/Time: 02/06/2020/6:04:10 PM    Final    VAS [place] LOWER EXTREMITY VENOUS (DVT)  Result Date: 02/07/2020  Lower Venous DVTStudy Indications: Elevated ddimer.  Limitations: Line and bandages. Comparison Study: no prior Performing Technologist: Blanch Media RVS  Examination Guidelines: A complete evaluation includes B-mode imaging, spectral Doppler, color Doppler, and power Doppler as needed of all accessible portions of each vessel. Bilateral testing is  considered an integral part of a complete examination. Limited examinations for reoccurring indications may be performed as noted. The reflux portion of the exam is performed with the patient in reverse Trendelenburg.  +---------+---------------+---------+-----------+----------+-------------------+ RIGHT    CompressibilityPhasicitySpontaneityPropertiesThrombus Aging      +---------+---------------+---------+-----------+----------+-------------------+ CFV                                                   not visualized due                                                        to line             +---------+---------------+---------+-----------+----------+-------------------+ SFJ                                                   not visualized due                                                        to line             +---------+---------------+---------+-----------+----------+-------------------+ FV Prox  Full                                                             +---------+---------------+---------+-----------+----------+-------------------+ FV Mid   Full                                                             +---------+---------------+---------+-----------+----------+-------------------+ FV DistalFull                                                             +---------+---------------+---------+-----------+----------+-------------------+  PFV      Full                                                             +---------+---------------+---------+-----------+----------+-------------------+ POP      Full           Yes      Yes                                      +---------+---------------+---------+-----------+----------+-------------------+ PTV      Full                                                             +---------+---------------+---------+-----------+----------+-------------------+ PERO     Full                                                              +---------+---------------+---------+-----------+----------+-------------------+   +---------+---------------+---------+-----------+----------+--------------+ LEFT     CompressibilityPhasicitySpontaneityPropertiesThrombus Aging +---------+---------------+---------+-----------+----------+--------------+ CFV      Full           Yes      Yes                                 +---------+---------------+---------+-----------+----------+--------------+ SFJ      Full                                                        +---------+---------------+---------+-----------+----------+--------------+ FV Prox  Full                                                        +---------+---------------+---------+-----------+----------+--------------+ FV Mid   Full                                                        +---------+---------------+---------+-----------+----------+--------------+ FV DistalFull                                                        +---------+---------------+---------+-----------+----------+--------------+ PFV      Full                                                        +---------+---------------+---------+-----------+----------+--------------+  POP      Full           Yes      Yes                                 +---------+---------------+---------+-----------+----------+--------------+ PTV      Full                                                        +---------+---------------+---------+-----------+----------+--------------+ PERO     Full                                                        +---------+---------------+---------+-----------+----------+--------------+     *See table(s) above for measurements and observations. Electronically signed by Waverly Ferrari MD on 02/07/2020 at 5:38:04 PM.    Final    [place] EKG SITE RITE  Result Date: 02/06/2020 If Site Rite image not  attached, placement could not be confirmed due to current cardiac rhythm.

## 2020-02-14 NOTE — Progress Notes (Addendum)
Electrophysiology Rounding Note  Patient Name: Kristina Nichols Date of Encounter: 02/14/2020  Primary Cardiologist: Dr. Shirlee Latch Electrophysiologist: Lanier Prude, MD   Subjective   The patient is doing well today.  At this time, the patient denies chest pain, shortness of breath, or any new concerns.  cMRI 02/13/2020 LVEF 56% with no LGE  Inpatient Medications    Scheduled Meds:  buPROPion  100 mg Oral TID   carvedilol  3.125 mg Oral BID WC   docusate sodium  100 mg Oral BID   enoxaparin (LOVENOX) injection  40 mg Subcutaneous Q24H   lamoTRIgine  150 mg Oral BID   mouth rinse  15 mL Mouth Rinse BID   mexiletine  150 mg Oral Q8H   pantoprazole  40 mg Oral Daily   sacubitril-valsartan  1 tablet Oral BID   spironolactone  25 mg Oral Daily   Continuous Infusions:  sodium chloride Stopped (02/10/20 2015)   PRN Meds: acetaminophen, clonazepam, influenza vac split quadrivalent PF, levalbuterol, polyethylene glycol, polyvinyl alcohol   Vital Signs    Vitals:   02/13/20 1451 02/13/20 2100 02/14/20 0500 02/14/20 0545  BP: 105/83 116/69  105/65  Pulse: 88 86    Resp: 18 18  18   Temp: 98.2 F (36.8 C) 98.6 F (37 C)  98.4 F (36.9 C)  TempSrc: Oral Oral  Oral  SpO2: 99% 100%  100%  Weight:   72.6 kg   Height:        Intake/Output Summary (Last 24 hours) at 02/14/2020 0803 Last data filed at 02/13/2020 1452 Gross per 24 hour  Intake 480 ml  Output --  Net 480 ml   Filed Weights   02/11/20 0500 02/12/20 0500 02/14/20 0500  Weight: 76.2 kg 74.7 kg 72.6 kg    Physical Exam    GEN- The patient is well appearing, alert and oriented x 3 today.   Head- normocephalic, atraumatic Eyes-  Sclera clear, conjunctiva pink Ears- hearing intact Oropharynx- clear Neck- supple Lungs- Clear to ausculation bilaterally, normal work of breathing Heart- Regular rate and rhythm, no murmurs, rubs or gallops GI- soft, NT, ND, + BS Extremities- no clubbing or cyanosis.  No edema Skin- no rash or lesion Psych- euthymic mood, full affect Neuro- strength and sensation are intact  Labs    CBC Recent Labs    02/12/20 0227 02/14/20 0227  WBC 6.9 7.9  HGB 11.0* 11.7*  HCT 34.8* 36.3  MCV 95.6 97.3  PLT 216 276   Basic Metabolic Panel Recent Labs    13/02/21 0227 02/12/20 0227 02/13/20 1108 02/14/20 0227  NA 137   < > 137 139  K 3.8   < > 4.0 4.3  CL 99   < > 101 100  CO2 30   < > 23 28  GLUCOSE 131*   < > 142* 109*  BUN 14   < > 13 14  CREATININE 0.52   < > 0.67 0.71  CALCIUM 9.1   < > 9.2 9.5  MG 1.9  --   --  2.0   < > = values in this interval not displayed.   Liver Function Tests Recent Labs    02/12/20 0227 02/14/20 0227  AST 39 38  ALT 86* 78*  ALKPHOS 124 135*  BILITOT 0.9 0.6  PROT 6.1* 6.4*  ALBUMIN 3.1* 3.3*   No results for input(s): LIPASE, AMYLASE in the last 72 hours. Cardiac Enzymes No results for input(s): CKTOTAL, CKMB, CKMBINDEX, TROPONINI in  the last 72 hours.  Telemetry    NSR 80s with occasional PVCs (personally reviewed)  Radiology    MR CARDIAC MORPHOLOGY W WO CONTRAST  Result Date: 02/13/2020 CLINICAL DATA:  Cardiomyopathy of uncertain etiology EXAM: CARDIAC MRI TECHNIQUE: The patient was scanned on a 1.5 Tesla GE magnet. A dedicated cardiac coil was used. Functional imaging was done using Fiesta sequences. 2,3, and 4 chamber views were done to assess for RWMA's. Modified Simpson's rule using a short axis stack was used to calculate an ejection fraction on a dedicated work [employer]. The patient received 8 cc of Gadavist. After 10 minutes inversion recovery sequences were used to assess for infiltration and scar tissue. FINDINGS: Fluid in fissure on right, otherwise no gross lung abnormalities. Normal left ventricular size and wall thickness, EF 56% with normal wall motion. Normal right ventricular size and systolic function, EF 47%. Mild biatrial enlargement. Trileaflet aortic valve,  no significant regurgitation or stenosis. No significant mitral regurgitation noted. On delayed enhancement imaging, there was no myocardial late gadolinium enhancement (LGE). Measurements: LVEDV 113 mL LVSV 63 mL LVEF 56% RVEDV 95 mL RVSV 44 mL RVEF 47% IMPRESSION: 1.  Normal LV size and systolic function, EF 56%. 2.  Normal RV size and systolic function, EF 47%. 3. No myocardial LGE, so no definitive evidence for prior MI, myocarditis, or infiltrative disease. Significant improvement in LV systolic function compared to prior echo. Suggestive of recovery of Takotsubo cardiomyopathy. [name] Electronically Signed   By: Marca Ancona M.D.   On: 02/13/2020 13:23   02/06/2020: TTE IMPRESSIONS  1. No LV thrombus visualized, thought cannot excluded given LV substrate.  Left ventricular ejection fraction, by estimation, is <20%. The left  ventricle has severely decreased function. The left ventricle demonstrates  global hypokinesis. Left  ventricular diastolic parameters are consistent with Grade I diastolic  dysfunction (impaired relaxation).  2. Right ventricular systolic function is normal. The right ventricular  size is normal.  3. The mitral valve is grossly normal. No evidence of mitral valve  regurgitation.  4. The aortic valve was not well visualized. Aortic valve regurgitation  is trivial.  5. Aortic IABP in Descending Aorta.  6. The inferior vena cava is dilated in size with <50% respiratory  variability, suggesting right atrial pressure of 15 mmHg.   Comparison(s): No prior Echocardiogram.    02/06/2020: RHC, IABP insertion 1. Mildly elevated left and right heart filling pressures.  2. Low cardiac output by thermodilution on norepinephrine 4, preserved output by Fick.  3. Successful IABP placement.  4. Swan left in place right IJ.     02/06/2020: LHC  There is severe left ventricular systolic dysfunction.  LV end diastolic pressure is moderately  elevated.  The left ventricular ejection fraction is less than 25% by visual estimate.  There is no mitral valve regurgitation.  1. No angiographic evidence of CAD 2. Severe segmental LV systolic dysfunction with wall motion suggesting Takotsubo's cardiomyopathy (stress induced cardiomyopathy).  3. Elevated LVEDP 4. Ventricular fibrillation/Torsades-Temporary Pacemaker placed per request of admitting/consulting team in case of need of overdrive pacing tonight  Recommendations: No further ischemic workup. Management of VF/Torsades per EP team in the am. Medical management of likely stress induced cardiomyopathy.    Patient Profile     54 y.o. female with a hx of depression/anxiety, hypothyroidism (off tx), GERD,chronic granulomatosis carrier (?), no noted past cardiac history admitted with VF arrest  She has had recurrent TdP requiring multiple defibrillations  Assessment &  Plan    1. VT/VF: On admission patient had recurrent torsades with multiple shocks, also triggered by RV pacing from temporary wire(removed).  Normal coronaries. Amiodarone stopped due to prolonged QT Got lidocaine -> now mexiletine.  No further VT.  ? prolonged QT in the setting of stress (Takotsubo-type) cardiomyopathy.  cMRI 02/13/2020 with normalized EF and no LGE.  Explained risks, benefits, and alternatives to ICD implantation, including but not limited to bleeding, infection, pneumothorax, pericardial effusion, lead dislodgement, heart attack, stroke, or death.  Pt verbalized understanding and agrees to proceed.   2. Acute systolic CHF/cardiogenic shock: EF <20%on echo (no LV thrombus), initial cath with normal coronaries. Concern for stress (Takotsubo-type) cardiomyopathy. Recent emotional stress. cMRI with normalization of EF.  Clinically stable per CHF team.   3. Anxiety Per primary   She is on the board for ICD today. Dr. Lalla Brothers has seen and discuss. Dr. Ladona Ridgel to see and perform  procedure.   For questions or updates, please contact [facility] Please consult [email or web address] for contact info under Cardiology/STEMI.  Signed, Graciella Freer, PA-C  02/14/2020, 8:03 AM   EP Attending  Patient seen and examined. Agree with the findings as noted above. Discussed with Dr. Lalla Brothers. The patient is much improved and is willing to proceed with ICD insertion for secondary prevention. Results of MRI scan noted. I have reviewed the indiccations/risks/benefits/goals/expectations of ICD insertion and she wishes to proceed.  Sharlot Gowda Marasia Newhall,MD

## 2020-02-14 NOTE — Progress Notes (Signed)
Patient resting comfortably on room air. No respiratory distress noted. BIPAP not needed at this time. RT will monitor as needed. ?

## 2020-02-14 NOTE — Progress Notes (Signed)
Pt just got IV in prep for ICD. Declines ambulating right now. Will f/u tomorrow. Ethelda Chick CES, ACSM 1:47 PM 02/14/2020

## 2020-02-15 ENCOUNTER — Encounter (HOSPITAL_COMMUNITY): Payer: Self-pay | Admitting: Internal Medicine

## 2020-02-15 ENCOUNTER — Inpatient Hospital Stay (HOSPITAL_COMMUNITY): Payer: BC Managed Care – PPO

## 2020-02-15 LAB — COMPREHENSIVE METABOLIC PANEL
ALT: 55 U/L — ABNORMAL HIGH (ref 0–44)
AST: 28 U/L (ref 15–41)
Albumin: 3 g/dL — ABNORMAL LOW (ref 3.5–5.0)
Alkaline Phosphatase: 121 U/L (ref 38–126)
Anion gap: 10 (ref 5–15)
BUN: 15 mg/dL (ref 6–20)
CO2: 25 mmol/L (ref 22–32)
Calcium: 8.9 mg/dL (ref 8.9–10.3)
Chloride: 100 mmol/L (ref 98–111)
Creatinine, Ser: 0.73 mg/dL (ref 0.44–1.00)
GFR, Estimated: >60 mL/min (ref >60)
Glucose, Bld: 126 mg/dL — ABNORMAL HIGH (ref 70–99)
Potassium: 4.3 mmol/L (ref 3.5–5.1)
Sodium: 135 mmol/L (ref 135–145)
Total Bilirubin: 0.4 mg/dL (ref 0.3–1.2)
Total Protein: 5.9 g/dL — ABNORMAL LOW (ref 6.5–8.1)

## 2020-02-15 LAB — CBC
HCT: 34 % — ABNORMAL LOW (ref 36.0–46.0)
Hemoglobin: 11.1 g/dL — ABNORMAL LOW (ref 12.0–15.0)
MCH: 31.6 pg (ref 26.0–34.0)
MCHC: 32.6 g/dL (ref 30.0–36.0)
MCV: 96.9 fL (ref 80.0–100.0)
Platelets: 291 10*3/uL (ref 150–400)
RBC: 3.51 MIL/uL — ABNORMAL LOW (ref 3.87–5.11)
RDW: 13.4 % (ref 11.5–15.5)
WBC: 7.7 10*3/uL (ref 4.0–10.5)
nRBC: 0 % (ref 0.0–0.2)

## 2020-02-15 LAB — MAGNESIUM: Magnesium: 1.7 mg/dL (ref 1.7–2.4)

## 2020-02-15 IMAGING — DX DG CHEST 2V
2 series · 2 of 2 positions shown · non-contrast
Comparison: Chest x-ray [DATE].

CLINICAL DATA: 53-year-old female status post ICD placement.

EXAM:
CHEST - 2 VIEW

[chest pa]
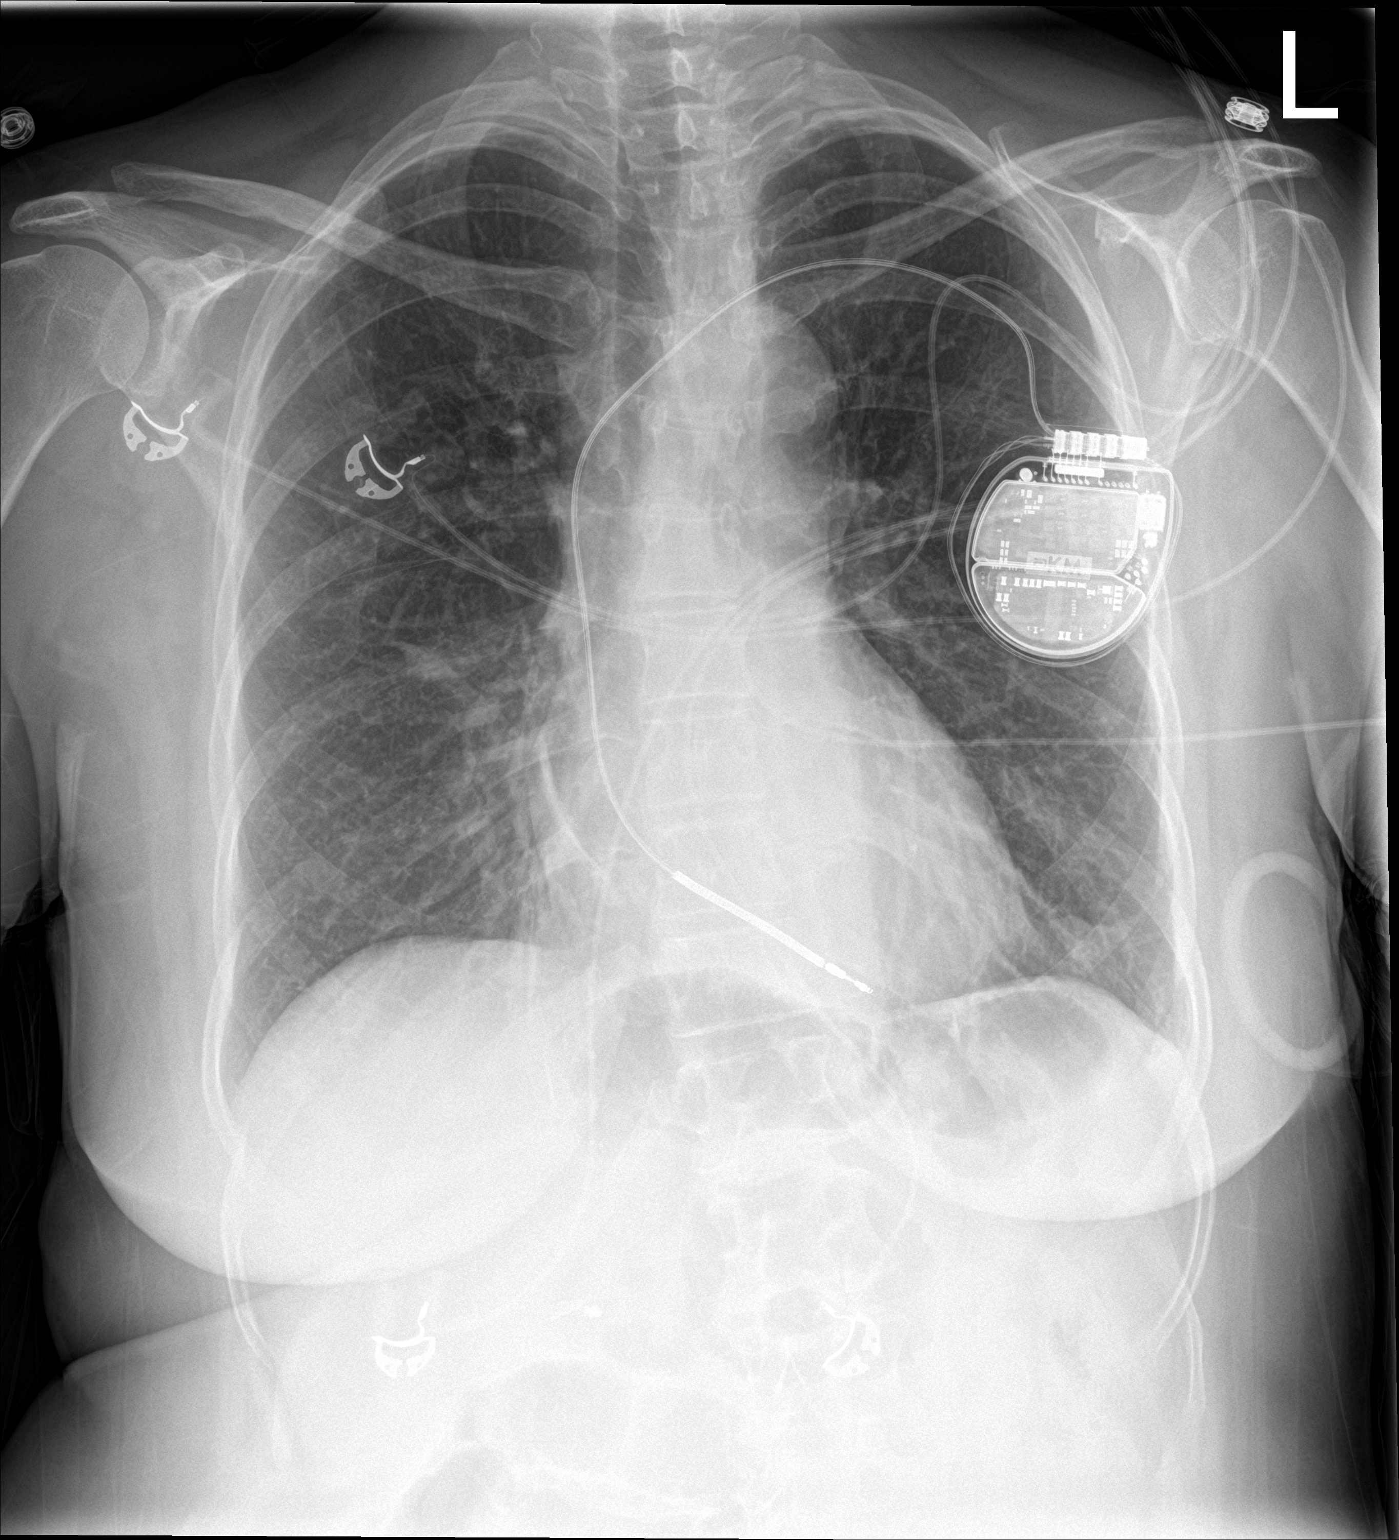

[chest lat]
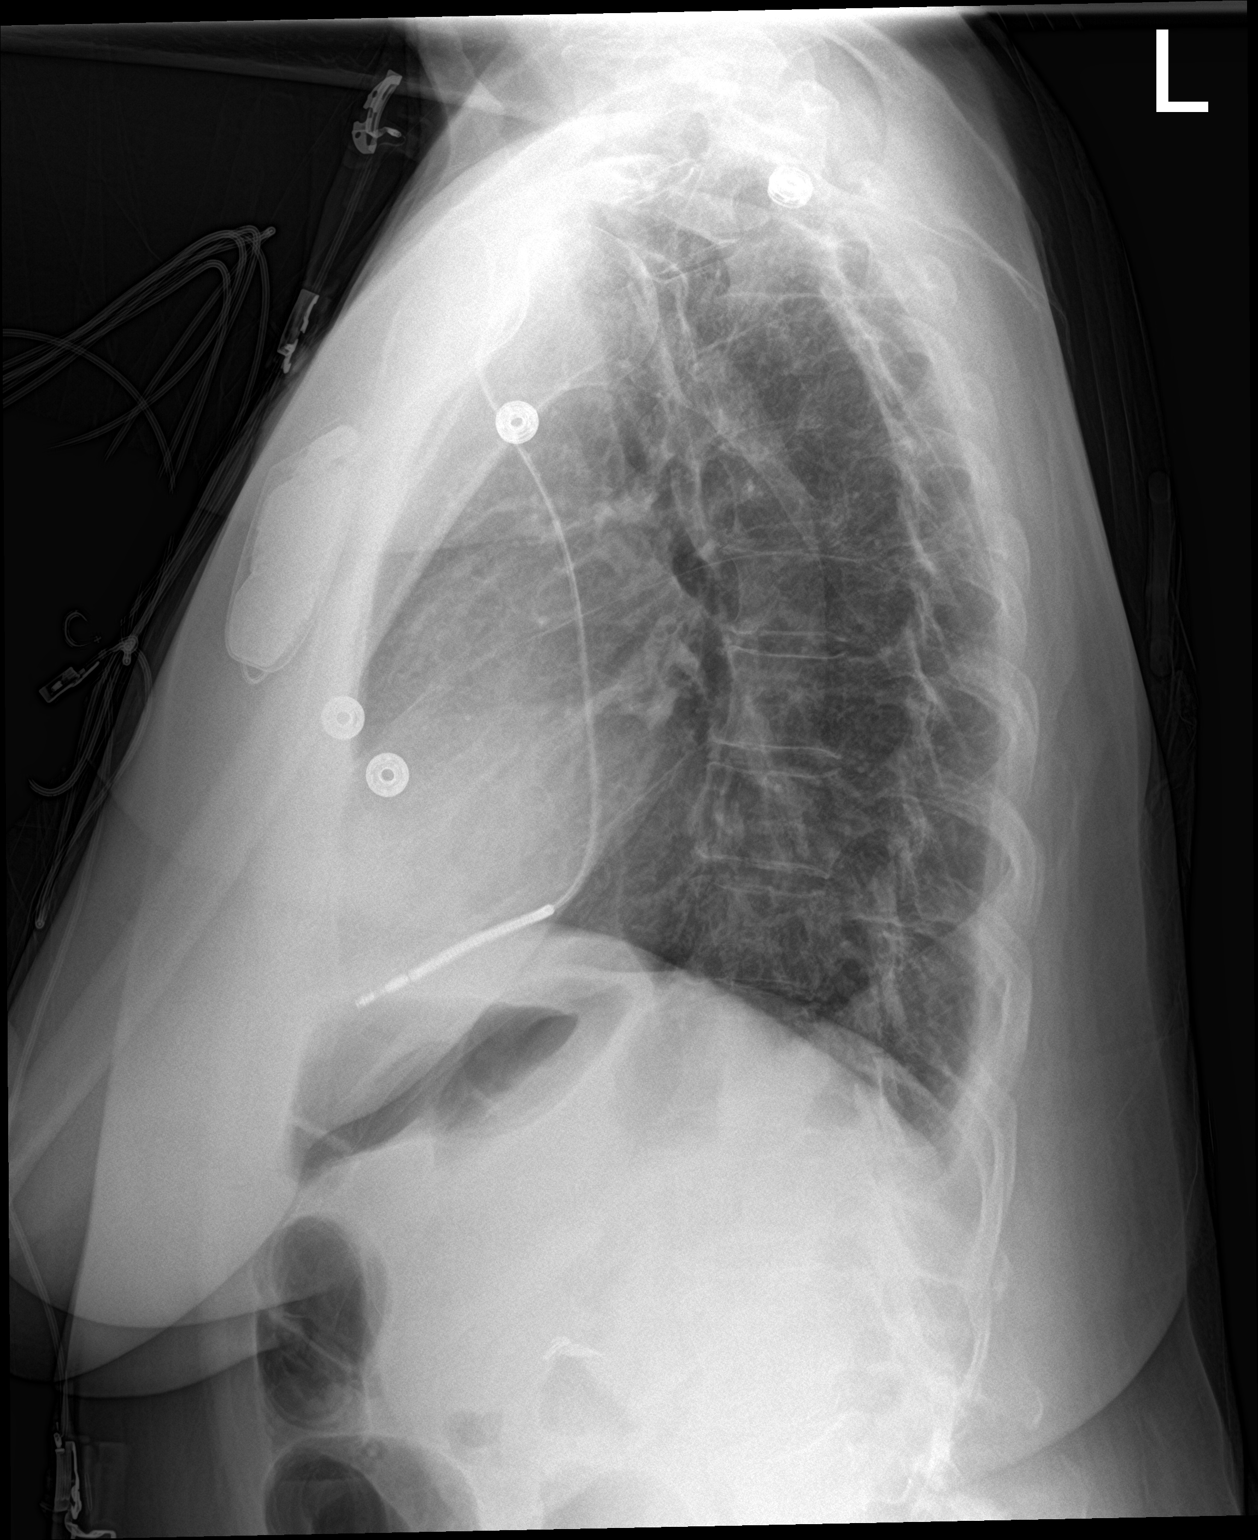

[2 of 2 positions shown; findings below may reference images not displayed]

FINDINGS: Lung volumes are normal. No consolidative airspace disease. No
pleural effusions. No pneumothorax. No pulmonary nodule or mass
noted. Pulmonary vasculature and the cardiomediastinal silhouette
are within normal limits. New left-sided pacemaker/AICD with lead
tip projecting over the expected location of the right ventricular
apex.
IMPRESSION: 1. New pacemaker/AICD which appears appropriately located. No
pneumothorax or other immediate complicating features.

## 2020-02-15 MED ORDER — SACUBITRIL-VALSARTAN 24-26 MG PO TABS: 1.0000 | ORAL_TABLET | Freq: Two times a day (BID) | ORAL | 1 refills | Status: DC

## 2020-02-15 MED ORDER — SPIRONOLACTONE 25 MG PO TABS
25.0000 mg | ORAL_TABLET | Freq: Every day | ORAL | 1 refills | Status: DC
Start: 2020-02-16 — End: 2020-04-16

## 2020-02-15 MED ORDER — CARVEDILOL 3.125 MG PO TABS
3.1250 mg | ORAL_TABLET | Freq: Two times a day (BID) | ORAL | 1 refills | Status: DC
Start: 2020-02-15 — End: 2020-04-16

## 2020-02-15 MED ORDER — MEXILETINE HCL 150 MG PO CAPS
150.0000 mg | ORAL_CAPSULE | Freq: Two times a day (BID) | ORAL | 1 refills | Status: DC
Start: 2020-02-15 — End: 2020-04-16

## 2020-02-15 MED FILL — Heparin Sod (Porcine)-NaCl IV Soln 1000 Unit/500ML-0.9%: INTRAVENOUS | Qty: 500 | Status: AC

## 2020-02-15 NOTE — Progress Notes (Signed)
Report given to Maralyn Sago, RN. Patient is alert and oriented x4 sitting up in bedside chair with call light in reach. VSS. Left chest ICD site WNL. Patient needs reinforcement on sling and limiting movement of left arm. Chest x-ray completed this AM. Plan is for patient to be DC'd today. Hgb 11.1, platelets 291, K+ 4.3, Creat 0.73 and Mg 1.7 this AM.

## 2020-02-15 NOTE — Discharge Summary (Signed)
Physician Discharge Summary  Kristina Nichols ZOX:[id number] DOB: 1965-05-24 DOA: 02/05/2020  PCP: Patient, No Pcp Per  Admit date: 02/05/2020   Discharge date: 02/15/2020  Admitted From: Home.  Disposition: Home  Recommendations for Outpatient Follow-up:  1. Follow up with PCP in 1-2 weeks. 2. Please obtain BMP/CBC in one week. 3. Advised to follow-up with congestive heart failure clinic.  Appointment has been made. 4. Advised to take Coreg 3.125 mg twice daily, Mexitil 150 mg twice daily, Entresto 1 tablet twice daily,  spironolactone 25 mg p.o. daily.  Home Health: None. Equipment/Devices:  AICD  Discharge Condition: Stable CODE STATUS:Full code Diet recommendation: Heart Healthy   Brief Summary / Hospital course: This 54 years old female presented in the ER after having witnessed cardiac arrest at home with refractory v. fib/ torsades. She wasIntubated in ER.She was initially admitted in the ICU.  She underwent LHC,  showed normal coronaries, findings were consistent with stress induced cardiomyopathy, LVEF <25%, She was briefly intubated for 4 days and successfully extubated on 02/09/2020, thereafter required brief BiPAP, She was stabilized and transferred to hospitalist service. She underwent SJ ICD placement 02/14/20. She tolerated well, denies any chest pain, shortness of breath, she ambulated well in the hallway, cleared from cardiology to be discharged home.  She was managed for below problems.  Discharge Diagnoses:  Active Problems:   Arrhythmia   Cardiac arrest (HCC)   Respiratory failure (HCC)   Acute systolic heart failure (HCC)  1. Cardiac arrest due to V. fib and torsades with acute systolic heart failure EF 25% due to most likely Takotsubo cardiomyopathy with no significant CAD on cath.  Causing acute hypoxic respiratory failure requiring intubation for 4 days, respiratory failure has resolved, she has been seen by cardiology and PCCM, underwent left and right heart  cath, briefly also required intra-aortic balloon pump.Cardiology following she required amiodarone drip and lidocaine drip, she is now on mexiletine, digoxin, Aldactone, ARB along with Coreg, Cardiology and electrophysiology group is following the patient, underwent cardiac MRI 02/13/2020,  She underwent AICD on 02/14/2020.   Cleared from cardiology to be discharged home.   2.  Acute ICU encephalopathy.  Resolved, no focal deficits, overall mildly anxious, also has underlying depression, CT head nonacute.  Continue supportive care.  Minimize benzos and narcotics.  3.  Dysphagia - on D2- NT, SLP following.  4.  Anxiety and depression.  Not suicidal homicidal, cautiously resume home medications if they are not QT prolonging.  We will stop Vyvanse per pharmacy as can prolong QT, resume Wellbutrin, Lamictal and as needed Klonopin. Vyvanse can be resumed as per primary care physician.   5.  Normocytic normochromic anemia.  Likely due to frequent blood draws.  Monitor.    Discharge Instructions  Discharge Instructions    Amb Referral to Cardiac Rehabilitation   Complete by: As directed    Diagnosis: NSTEMI   After initial evaluation and assessments completed: Virtual Based Care may be provided alone or in conjunction with Phase 2 Cardiac Rehab based on patient barriers.: Yes   Call MD for:  persistant dizziness or light-headedness   Complete by: As directed    Call MD for:  persistant nausea and vomiting   Complete by: As directed    Call MD for:  temperature >100.4   Complete by: As directed    Diet - low sodium heart healthy   Complete by: As directed    Diet Carb Modified   Complete by: As directed  Discharge instructions   Complete by: As directed    Advised to follow-up with primary care physician in 1 week. Advised to follow-up with congestive heart failure clinic.  Appointment has been made. Advised to take Coreg 3.125 mg twice daily, Mexitil 150 mg twice daily Entresto 1  tablet twice daily spironolactone 25 mg p.o. daily.   Increase activity slowly   Complete by: As directed      Allergies as of 02/15/2020   No Known Allergies     Medication List    STOP taking these medications   ibuprofen 200 MG tablet Commonly known as: ADVIL   lisdexamfetamine 60 MG capsule Commonly known as: VYVANSE     TAKE these medications   acetaminophen 500 MG tablet Commonly known as: TYLENOL Take 500-1,000 mg by mouth every 6 (six) hours as needed for mild pain or headache.   buPROPion 150 MG 24 hr tablet Commonly known as: WELLBUTRIN XL Take 150 mg by mouth at bedtime.   buPROPion 300 MG 24 hr tablet Commonly known as: WELLBUTRIN XL Take 300 mg by mouth in the morning.   carvedilol 3.125 MG tablet Commonly known as: COREG Take 1 tablet (3.125 mg total) by mouth 2 (two) times daily with a meal.   clonazePAM 1 MG tablet Commonly known as: KLONOPIN Take 1 mg by mouth 3 (three) times daily as needed for anxiety.   lamoTRIgine 150 MG tablet Commonly known as: LAMICTAL Take 150 mg by mouth 2 (two) times daily.   mexiletine 150 MG capsule Commonly known as: MEXITIL Take 1 capsule (150 mg total) by mouth 2 (two) times daily.   omeprazole 20 MG tablet Commonly known as: PRILOSEC OTC Take 20 mg by mouth daily as needed (for heartburn or indigestion).   sacubitril-valsartan 24-26 MG Commonly known as: ENTRESTO Take 1 tablet by mouth 2 (two) times daily.   spironolactone 25 MG tablet Commonly known as: ALDACTONE Take 1 tablet (25 mg total) by mouth daily. Start taking on: February 16, 2020       Follow-up Information    [facility] Follow up on 02/22/2020.   Specialty: Cardiology Why: at 11:30 Located at [facility]. Entrance C.  Contact information: [address] [id number] mc [address] [place] [address] [phone]       Lanier Prude, MD Follow up on 05/22/2020.   Specialties:  Cardiology, Radiology Why: at 315 for 3 month ICD check Contact information: [address] [place] [address] [phone]        [facility] Follow up on 03/01/2020.   Why: at 240 for post ICD wound check Contact information: [address] [place] [address] [phone]             No Known Allergies  Consultations:  Cardiology, CHF team   Procedures/Studies: DG Chest 2 View  Result Date: 02/15/2020 CLINICAL DATA:  54 year old female status post ICD placement. EXAM: CHEST - 2 VIEW COMPARISON:  Chest x-ray 02/08/2020. FINDINGS: Lung volumes are normal. No consolidative airspace disease. No pleural effusions. No pneumothorax. No pulmonary nodule or mass noted. Pulmonary vasculature and the cardiomediastinal silhouette are within normal limits. New left-sided pacemaker/AICD with lead tip projecting over the expected location of the right ventricular apex. IMPRESSION: 1. New pacemaker/AICD which appears appropriately located. No pneumothorax or other immediate complicating features. Electronically Signed   By: Trudie Reed M.D.   On: 02/15/2020 07:59  CT Head Wo Contrast  Result Date: 02/05/2020 CLINICAL DATA:  54 year old female with altered mental status post cardiac arrest. EXAM: CT HEAD WITHOUT CONTRAST TECHNIQUE: Contiguous axial images were obtained from the base of the skull through the vertex without intravenous contrast. COMPARISON:  None. FINDINGS: Brain: No evidence of acute infarction, hemorrhage, hydrocephalus, extra-axial collection or mass lesion/mass effect. Vascular: No hyperdense vessel or unexpected calcification. Skull: Normal. Negative for fracture or focal lesion. Sinuses/Orbits: No acute finding. Other: Oral intubation noted. IMPRESSION: No evidence of acute intracranial abnormality. Electronically Signed   By: Harmon Pier M.D.   On: 02/05/2020 18:48   CARDIAC  CATHETERIZATION  Result Date: 02/06/2020 1. Mildly elevated left and right heart filling pressures. 2. Low cardiac output by thermodilution on norepinephrine 4, preserved output by Fick. 3. Successful IABP placement. 4. Swan left in place right IJ.   CARDIAC CATHETERIZATION  Result Date: 02/05/2020  There is severe left ventricular systolic dysfunction.  LV end diastolic pressure is moderately elevated.  The left ventricular ejection fraction is less than 25% by visual estimate.  There is no mitral valve regurgitation.  1. No angiographic evidence of CAD 2. Severe segmental LV systolic dysfunction with wall motion suggesting Takotsubo's cardiomyopathy (stress induced cardiomyopathy). 3. Elevated LVEDP 4. Ventricular fibrillation/Torsades-Temporary Pacemaker placed per request of admitting/consulting team in case of need of overdrive pacing tonight Recommendations: No further ischemic workup. Management of VF/Torsades per EP team in the am. Medical management of likely stress induced cardiomyopathy.   EP PPM/ICD IMPLANT  Result Date: 02/14/2020 CONCLUSIONS:  1. Non- Ischemic cardiomyopathy with recurrent VF.  2. Successful ICD implantation.  3. DFT less than or equal to 15 joules.  4. No early apparent complications. ICD Criteria Current LVEF:55%. Within 12 months prior to implant: Yes Heart failure history: Yes, Class I Cardiomyopathy history: Yes, Non-Ischemic Cardiomyopathy. Atrial Fibrillation/Atrial Flutter: No. Ventricular tachycardia history: No. Cardiac arrest history: Yes, Ventricular Fibrillation. History of syndromes with risk of sudden death: No. Previous ICD: No. Current ICD indication: Secondary PPM indication: No.  Class I or II Bradycardia indication present: No Beta Blocker therapy for 3 or more months: no. Ace Inhibitor/ARB therapy for 3 or more months: No, medical reason. Lewayne Bunting, MD 3:33 PM 02/14/2020   DG CHEST PORT 1 VIEW  Result Date: 02/08/2020 CLINICAL DATA:  Acute  systolic heart failure. EXAM: PORTABLE CHEST 1 VIEW COMPARISON:  February 07, 2020. FINDINGS: The heart size and mediastinal contours are within normal limits. Endotracheal and nasogastric tubes are unchanged in position. Right internal jugular Swan-Ganz catheter is unchanged. No pneumothorax or pleural effusion is noted. Left lung is clear. Right upper lobe airspace opacity is nearly resolved. The visualized skeletal structures are unremarkable. IMPRESSION: Stable support apparatus. Right upper lobe airspace opacity is nearly resolved. Electronically Signed   By: Lupita Raider M.D.   On: 02/08/2020 08:36   DG CHEST PORT 1 VIEW  Result Date: 02/07/2020 CLINICAL DATA:  54 year old female with heart failure. EXAM: PORTABLE CHEST 1 VIEW COMPARISON:  Earlier radiograph dated 02/07/2020. FINDINGS: Endotracheal tube, enteric tube, and Swan-Ganz in similar position. There has been interval development of an area of opacity in the right upper lung field with overall decreased volume consistent with lobar atelectasis or collapse. Clinical correlation is recommended. No large pleural effusion. No pneumothorax. Stable cardiac silhouette. No acute osseous pathology. IMPRESSION: Interval development of lobar atelectasis or collapse of the right upper lung field. Follow-up recommended. Electronically Signed   By: Ceasar Mons.D.  On: 02/07/2020 18:20   DG Chest Port 1 View  Result Date: 02/07/2020 CLINICAL DATA:  Post cardiac arrest. EXAM: PORTABLE CHEST 1 VIEW COMPARISON:  02/05/2020 FINDINGS: Endotracheal tube in good position. NG tube enters the stomach with the tip not visualized. Involve placement of right jugular Swan-Ganz catheter. Swan-Ganz catheter tip right lower lobe pulmonary artery. No pneumothorax. Improvement in bilateral infiltrates compared with the prior study. No effusion. IMPRESSION: Swan-Ganz catheter tip right lower lobe pulmonary artery. No pneumothorax. Improvement in bilateral airspace  disease likely edema. Electronically Signed   By: Marlan Palau M.D.   On: 02/07/2020 08:35   DG Chest Port 1 View  Result Date: 02/05/2020 CLINICAL DATA:  Status post CPR. EXAM: PORTABLE CHEST 1 VIEW COMPARISON:  February 05, 2020 (6:03 p.m.) FINDINGS: There is stable endotracheal tube and nasogastric tube positioning. Mild diffusely increased interstitial lung markings are seen with moderate to marked severity patchy left suprahilar and right upper lobe infiltrates. Mild bilateral lower lobe infiltrates are also seen. These areas are mildly increased in severity when compared to the prior study. The heart size and mediastinal contours are within normal limits. The visualized skeletal structures are unremarkable. IMPRESSION: Moderate to marked severity bilateral patchy infiltrates, as described above, with mild interval increase in severity when compared to the prior exam. Electronically Signed   By: Aram Candela M.D.   On: 02/05/2020 20:25   DG Chest Portable 1 View  Result Date: 02/05/2020 CLINICAL DATA:  Post intubation, witnessed cardiac arrest and CPR EXAM: PORTABLE CHEST 1 VIEW COMPARISON:  None FINDINGS: Endotracheal tube terminates between the clavicles approximately 4 cm above the carina. Gastric tube courses through in a off the field of the radiograph, side port in the region of the mid stomach, tip off the field of view. Pacer defibrillator pads project over the patient. Upper lobe opacities are present with some elevation of the minor fissure in the RIGHT chest. Less opacity at the LEFT lung apex. On limited assessment no acute skeletal process. IMPRESSION: 1. Endotracheal tube terminates between the clavicles approximately 4 cm above the carina. 2. Suggested volume loss and consolidative changes in the RIGHT upper lobe greater than LEFT upper lobe. Findings could be related to volume loss with pneumonitis from aspiration and potential background of asymmetric pulmonary edema or  pneumonia. Underlying mass would be difficult to exclude. Follow-up after resolution of acute symptoms is suggested to exclude underlying lesion. 3. Gastric tube courses through a off the field of the radiograph, side port in the region of the mid stomach, tip off the field of view. Electronically Signed   By: Donzetta Kohut M.D.   On: 02/05/2020 18:20   MR CARDIAC MORPHOLOGY W WO CONTRAST  Result Date: 02/13/2020 CLINICAL DATA:  Cardiomyopathy of uncertain etiology EXAM: CARDIAC MRI TECHNIQUE: The patient was scanned on a 1.5 Tesla GE magnet. A dedicated cardiac coil was used. Functional imaging was done using Fiesta sequences. 2,3, and 4 chamber views were done to assess for RWMA's. Modified Simpson's rule using a short axis stack was used to calculate an ejection fraction on a dedicated work [employer]. The patient received 8 cc of Gadavist. After 10 minutes inversion recovery sequences were used to assess for infiltration and scar tissue. FINDINGS: Fluid in fissure on right, otherwise no gross lung abnormalities. Normal left ventricular size and wall thickness, EF 56% with normal wall motion. Normal right ventricular size and systolic function, EF 47%. Mild biatrial enlargement. Trileaflet aortic valve, no significant  regurgitation or stenosis. No significant mitral regurgitation noted. On delayed enhancement imaging, there was no myocardial late gadolinium enhancement (LGE). Measurements: LVEDV 113 mL LVSV 63 mL LVEF 56% RVEDV 95 mL RVSV 44 mL RVEF 47% IMPRESSION: 1.  Normal LV size and systolic function, EF 56%. 2.  Normal RV size and systolic function, EF 47%. 3. No myocardial LGE, so no definitive evidence for prior MI, myocarditis, or infiltrative disease. Significant improvement in LV systolic function compared to prior echo. Suggestive of recovery of Takotsubo cardiomyopathy. [name] Electronically Signed   By: Marca Ancona M.D.   On: 02/13/2020 13:23   ECHOCARDIOGRAM  COMPLETE  Result Date: 02/06/2020    ECHOCARDIOGRAM REPORT   Patient Name:   [facility] Date of Exam: 02/06/2020 Medical Rec #:  [id number]    Height:       65.0 in Accession #:    [id number]   Weight:       161.8 lb Date of Birth:  11-21-1965   BSA:          1.808 m Patient Age:    53 years     BP:           136/80 mmHg Patient Gender: F            HR:           64 bpm. Exam Location:  Inpatient Procedure: 2D Echo Indications:    I46.9 cardiac arrest  History:        Patient has no prior history of Echocardiogram examinations.                 Risk Factors:Dyslipidemia. Temporary pacemaker.  Sonographer:    Celene Skeen RDCS (AE) Referring Phys: [id number] CHRISTOPHER D [facility]  Sonographer Comments: Suboptimal apical window and echo performed with patient supine and on artificial respirator. restricted mobility IMPRESSIONS  1. No LV thrombus visualized, thought cannot excluded given LV substrate. Left ventricular ejection fraction, by estimation, is <20%. The left ventricle has severely decreased function. The left ventricle demonstrates global hypokinesis. Left ventricular diastolic parameters are consistent with Grade I diastolic dysfunction (impaired relaxation).  2. Right ventricular systolic function is normal. The right ventricular size is normal.  3. The mitral valve is grossly normal. No evidence of mitral valve regurgitation.  4. The aortic valve was not well visualized. Aortic valve regurgitation is trivial.  5. Aortic IABP in Descending Aorta.  6. The inferior vena cava is dilated in size with <50% respiratory variability, suggesting right atrial pressure of 15 mmHg. Comparison(s): No prior Echocardiogram. Conclusion(s)/Recommendation(s): Severe LV Dysfunction; primary team aware. FINDINGS  Left Ventricle: No LV thrombus visualized, thought cannot excluded given LV substrate. Left ventricular ejection fraction, by estimation, is <20%. The left ventricle has severely decreased function. The left  ventricle demonstrates global hypokinesis. The left ventricular internal cavity size was normal in size. There is no left ventricular hypertrophy. Left ventricular diastolic parameters are consistent with Grade I diastolic dysfunction (impaired relaxation). Right Ventricle: The right ventricular size is normal. No increase in right ventricular wall thickness. Right ventricular systolic function is normal. Left Atrium: Left atrial size was normal in size. Right Atrium: Right atrial size was normal in size. Pericardium: There is no evidence of pericardial effusion. Mitral Valve: The mitral valve is grossly normal. No evidence of mitral valve regurgitation. Tricuspid Valve: The tricuspid valve is not well visualized. Tricuspid valve regurgitation is not demonstrated. Aortic Valve: The aortic valve was not well visualized. Aortic valve regurgitation  is trivial. Pulmonic Valve: The pulmonic valve was not well visualized. Pulmonic valve regurgitation is not visualized. Aorta: IABP in Descending Aorta and the aortic root is normal in size and structure. Venous: The pulmonary veins were not well visualized. The inferior vena cava is dilated in size with less than 50% respiratory variability, suggesting right atrial pressure of 15 mmHg. IAS/Shunts: The atrial septum is grossly normal.  LEFT VENTRICLE PLAX 2D LVIDd:         4.40 cm  Diastology LVIDs:         3.60 cm  LV e' medial:    5.98 cm/s LV PW:         0.80 cm  LV E/e' medial:  11.3 LV IVS:        0.70 cm  LV e' lateral:   6.53 cm/s LVOT diam:     2.20 cm  LV E/e' lateral: 10.4 LV SV:         54 LV SV Index:   30 LVOT Area:     3.80 cm  RIGHT VENTRICLE RV S prime:     10.00 cm/s TAPSE (M-mode): 2.0 cm LEFT ATRIUM         Index LA diam:    2.80 cm 1.55 cm/m  AORTIC VALVE LVOT Vmax:   72.50 cm/s LVOT Vmean:  59.300 cm/s LVOT VTI:    0.141 m  AORTA Ao Root diam: 2.80 cm MITRAL VALVE MV Area (PHT): 3.72 cm    SHUNTS MV Decel Time: 204 msec    Systemic VTI:  0.14 m MV E  velocity: 67.70 cm/s  Systemic Diam: 2.20 cm MV A velocity: 63.80 cm/s MV E/A ratio:  1.06 Riley Lam MD Electronically signed by Riley Lam MD Signature Date/Time: 02/06/2020/6:04:10 PM    Final    VAS [place] LOWER EXTREMITY VENOUS (DVT)  Result Date: 02/07/2020  Lower Venous DVTStudy Indications: Elevated ddimer.  Limitations: Line and bandages. Comparison Study: no prior Performing Technologist: Blanch Media RVS  Examination Guidelines: A complete evaluation includes B-mode imaging, spectral Doppler, color Doppler, and power Doppler as needed of all accessible portions of each vessel. Bilateral testing is considered an integral part of a complete examination. Limited examinations for reoccurring indications may be performed as noted. The reflux portion of the exam is performed with the patient in reverse Trendelenburg.  +---------+---------------+---------+-----------+----------+-------------------+ RIGHT    CompressibilityPhasicitySpontaneityPropertiesThrombus Aging      +---------+---------------+---------+-----------+----------+-------------------+ CFV                                                   not visualized due                                                        to line             +---------+---------------+---------+-----------+----------+-------------------+ SFJ                                                   not visualized due  to line             +---------+---------------+---------+-----------+----------+-------------------+ FV Prox  Full                                                             +---------+---------------+---------+-----------+----------+-------------------+ FV Mid   Full                                                             +---------+---------------+---------+-----------+----------+-------------------+ FV DistalFull                                                              +---------+---------------+---------+-----------+----------+-------------------+ PFV      Full                                                             +---------+---------------+---------+-----------+----------+-------------------+ POP      Full           Yes      Yes                                      +---------+---------------+---------+-----------+----------+-------------------+ PTV      Full                                                             +---------+---------------+---------+-----------+----------+-------------------+ PERO     Full                                                             +---------+---------------+---------+-----------+----------+-------------------+   +---------+---------------+---------+-----------+----------+--------------+ LEFT     CompressibilityPhasicitySpontaneityPropertiesThrombus Aging +---------+---------------+---------+-----------+----------+--------------+ CFV      Full           Yes      Yes                                 +---------+---------------+---------+-----------+----------+--------------+ SFJ      Full                                                        +---------+---------------+---------+-----------+----------+--------------+  FV Prox  Full                                                        +---------+---------------+---------+-----------+----------+--------------+ FV Mid   Full                                                        +---------+---------------+---------+-----------+----------+--------------+ FV DistalFull                                                        +---------+---------------+---------+-----------+----------+--------------+ PFV      Full                                                        +---------+---------------+---------+-----------+----------+--------------+ POP      Full           Yes       Yes                                 +---------+---------------+---------+-----------+----------+--------------+ PTV      Full                                                        +---------+---------------+---------+-----------+----------+--------------+ PERO     Full                                                        +---------+---------------+---------+-----------+----------+--------------+     *See table(s) above for measurements and observations. Electronically signed by Waverly Ferrari MD on 02/07/2020 at 5:38:04 PM.    Final    [place] EKG SITE RITE  Result Date: 02/06/2020 If Site Rite image not attached, placement could not be confirmed due to current cardiac rhythm.   LHC, AICD    Subjective: Patient was seen and examined at bedside.  Patient reports feeling much better,  denies any chest pain,  shortness of breath, ambulated in the hallway without any problems.  She wants to be discharged home.  Patient is cleared from CHF team to be discharged.  Discharge Exam: Vitals:   02/15/20 0500 02/15/20 0807  BP: 96/75 116/71  Pulse: 97   Resp: 19 18  Temp: 98.2 F (36.8 C) 98.2 F (36.8 C)  SpO2: 96%    Vitals:   02/14/20 2011 02/14/20 2317 02/15/20 0500 02/15/20 0807  BP: 110/62  96/75 116/71  Pulse: 87  97   Resp: 18  19 18  Temp: 98.4 F (36.9 C)  98.2 F (36.8 C) 98.2 F (36.8 C)  TempSrc: Oral  Oral   SpO2: 100% 100% 96%   Weight:   72.9 kg   Height:        General: Pt is alert, awake, not in acute distress Cardiovascular: RRR, S1/S2 +, no rubs, no gallops. AICD noted Respiratory: CTA bilaterally, no wheezing, no rhonchi Abdominal: Soft, NT, ND, bowel sounds + Extremities: no edema, no cyanosis    The results of significant diagnostics from this hospitalization (including imaging, microbiology, ancillary and laboratory) are listed below for reference.     Microbiology: Recent Results (from the past 240 hour(s))  Resp Panel by RT PCR  (RSV, Flu A&B, Covid) - Nasopharyngeal Swab     Status: None   Collection Time: 02/05/20  6:14 PM   Specimen: Nasopharyngeal Swab  Result Value Ref Range Status   SARS Coronavirus 2 by RT PCR NEGATIVE NEGATIVE Final    Comment: (NOTE) SARS-CoV-2 target nucleic acids are NOT DETECTED.  The SARS-CoV-2 RNA is generally detectable in upper respiratoy specimens during the acute phase of infection. The lowest concentration of SARS-CoV-2 viral copies this assay can detect is 131 copies/mL. A negative result does not preclude SARS-Cov-2 infection and should not be used as the sole basis for treatment or other patient management decisions. A negative result may occur with  improper specimen collection/handling, submission of specimen other than nasopharyngeal swab, presence of viral mutation(s) within the areas targeted by this assay, and inadequate number of viral copies (<131 copies/mL). A negative result must be combined with clinical observations, patient history, and epidemiological information. The expected result is Negative.  Fact Sheet for Patients:  https://www.moore.com/  Fact Sheet for Healthcare Providers:  https://www.young.biz/  This test is no t yet approved or cleared by the [place] FDA and  has been authorized for detection and/or diagnosis of SARS-CoV-2 by FDA under an Emergency Use Authorization (EUA). This EUA will remain  in effect (meaning this test can be used) for the duration of the COVID-19 declaration under Section 564(b)(1) of the Act, 21 U.S.C. section 360bbb-3(b)(1), unless the authorization is terminated or revoked sooner.     Influenza A by PCR NEGATIVE NEGATIVE Final   Influenza B by PCR NEGATIVE NEGATIVE Final    Comment: (NOTE) The Xpert Xpress SARS-CoV-2/FLU/RSV assay is intended as an aid in  the diagnosis of influenza from Nasopharyngeal swab specimens and  should not be used as a sole basis for  treatment. Nasal washings and  aspirates are unacceptable for Xpert Xpress SARS-CoV-2/FLU/RSV  testing.  Fact Sheet for Patients: https://www.moore.com/  Fact Sheet for Healthcare Providers: https://www.young.biz/  This test is not yet approved or cleared by the [place] FDA and  has been authorized for detection and/or diagnosis of SARS-CoV-2 by  FDA under an Emergency Use Authorization (EUA). This EUA will remain  in effect (meaning this test can be used) for the duration of the  Covid-19 declaration under Section 564(b)(1) of the Act, 21  U.S.C. section 360bbb-3(b)(1), unless the authorization is  terminated or revoked.    Respiratory Syncytial Virus by PCR NEGATIVE NEGATIVE Final    Comment: (NOTE) Fact Sheet for Patients: https://www.moore.com/  Fact Sheet for Healthcare Providers: https://www.young.biz/  This test is not yet approved or cleared by the [place] FDA and  has been authorized for detection and/or diagnosis of SARS-CoV-2 by  FDA under an Emergency Use Authorization (EUA). This EUA will remain  in effect (  meaning this test can be used) for the duration of the  COVID-19 declaration under Section 564(b)(1) of the Act, 21 U.S.C.  section 360bbb-3(b)(1), unless the authorization is terminated or  revoked. Performed at [facility], [address], [place], [place] [address]   MRSA PCR Screening     Status: None   Collection Time: 02/05/20 10:25 PM   Specimen: Nasopharyngeal  Result Value Ref Range Status   MRSA by PCR NEGATIVE NEGATIVE Final    Comment:        The GeneXpert MRSA Assay (FDA approved for NASAL specimens only), is one component of a comprehensive MRSA colonization surveillance program. It is not intended to diagnose MRSA infection nor to guide or monitor treatment for MRSA infections. Performed at [facility], [address],  [place], [place] [address]   Culture, blood (routine x 2)     Status: None   Collection Time: 02/07/20  8:21 AM   Specimen: BLOOD LEFT ARM  Result Value Ref Range Status   Specimen Description BLOOD LEFT ARM  Final   Special Requests   Final    BOTTLES DRAWN AEROBIC AND ANAEROBIC Blood Culture adequate volume   Culture   Final    NO GROWTH 5 DAYS Performed at [facility], [address], [place], [place] [address]    Report Status 02/12/2020 FINAL  Final  Culture, blood (routine x 2)     Status: None   Collection Time: 02/07/20  8:34 AM   Specimen: BLOOD  Result Value Ref Range Status   Specimen Description BLOOD LEFT ANTECUBITAL  Final   Special Requests   Final    BOTTLES DRAWN AEROBIC AND ANAEROBIC Blood Culture adequate volume   Culture   Final    NO GROWTH 5 DAYS Performed at [facility], [address] [place], [place] [address]    Report Status 02/12/2020 FINAL  Final  Culture, Urine     Status: None   Collection Time: 02/07/20  2:42 PM   Specimen: Urine, Catheterized  Result Value Ref Range Status   Specimen Description URINE, CATHETERIZED  Final   Special Requests NONE  Final   Culture   Final    NO GROWTH Performed at [facility], [address], [place], [place] [address]    Report Status 02/08/2020 FINAL  Final  Surgical PCR screen     Status: None   Collection Time: 02/14/20 11:39 AM   Specimen: Nasal Mucosa; Nasal Swab  Result Value Ref Range Status   MRSA, PCR NEGATIVE NEGATIVE Final   Staphylococcus aureus NEGATIVE NEGATIVE Final    Comment: (NOTE) The Xpert SA Assay (FDA approved for NASAL specimens in patients 57 years of age and older), is one component of a comprehensive surveillance program. It is not intended to diagnose infection nor to guide or monitor treatment. Performed at [facility], [address], [place], [place] [address]      Labs: BNP (last 3 results) Recent Labs    02/11/20 1335  BNP 88.4   Basic  Metabolic Panel: Recent Labs  Lab 02/10/20 0214 02/10/20 0637 02/11/20 1335 02/12/20 0227 02/13/20 1108 02/14/20 0227 02/15/20 0152  NA  --    < > 137 137 137 139 135  K  --    < > 4.0 3.8 4.0 4.3 4.3  CL  --    < > 97* 99 101 100 100  CO2  --    < >  31 30 23 28 25   GLUCOSE  --    < > 125* 131* 142* 109* 126*  BUN  --    < > 17 14 13 14 15   CREATININE  --    < > 0.61 0.52 0.67 0.71 0.73  CALCIUM  --    < > 9.2 9.1 9.2 9.5 8.9  MG 2.0  --  1.8 1.9  --  2.0 1.7   < > = values in this interval not displayed.   Liver Function Tests: Recent Labs  Lab 02/10/20 0330 02/11/20 1335 02/12/20 0227 02/14/20 0227 02/15/20 0152  AST 46* 53* 39 38 28  ALT 104* 101* 86* 78* 55*  ALKPHOS 134* 135* 124 135* 121  BILITOT 0.7 0.5 0.9 0.6 0.4  PROT 5.9* 6.4* 6.1* 6.4* 5.9*  ALBUMIN 2.8* 3.2* 3.1* 3.3* 3.0*   No results for input(s): LIPASE, AMYLASE in the last 168 hours. No results for input(s): AMMONIA in the last 168 hours. CBC: Recent Labs  Lab 02/10/20 0214 02/11/20 1335 02/12/20 0227 02/14/20 0227 02/15/20 0152  WBC 7.3 7.2 6.9 7.9 7.7  HGB 9.9* 11.3* 11.0* 11.7* 11.1*  HCT 30.9* 35.7* 34.8* 36.3 34.0*  MCV 96.6 96.2 95.6 97.3 96.9  PLT 101* 203 216 276 291   Cardiac Enzymes: No results for input(s): CKTOTAL, CKMB, CKMBINDEX, TROPONINI in the last 168 hours. BNP: Invalid input(s): POCBNP CBG: Recent Labs  Lab 02/11/20 0001 02/11/20 0357 02/11/20 0727 02/11/20 1128 02/11/20 1525  GLUCAP 96 84 104* 97 111*   D-Dimer No results for input(s): DDIMER in the last 72 hours. Hgb A1c No results for input(s): HGBA1C in the last 72 hours. Lipid Profile No results for input(s): CHOL, HDL, LDLCALC, TRIG, CHOLHDL, LDLDIRECT in the last 72 hours. Thyroid function studies No results for input(s): TSH, T4TOTAL, T3FREE, THYROIDAB in the last 72 hours.  Invalid input(s): FREET3 Anemia work up No results for input(s): VITAMINB12, FOLATE, FERRITIN, TIBC, IRON, RETICCTPCT in  the last 72 hours. Urinalysis    Component Value Date/Time   COLORURINE YELLOW 02/05/2020 1745   APPEARANCEUR HAZY (A) 02/05/2020 1745   LABSPEC 1.013 02/05/2020 1745   PHURINE 6.0 02/05/2020 1745   GLUCOSEU 50 (A) 02/05/2020 1745   HGBUR SMALL (A) 02/05/2020 1745   BILIRUBINUR NEGATIVE 02/05/2020 1745   KETONESUR NEGATIVE 02/05/2020 1745   PROTEINUR >=300 (A) 02/05/2020 1745   NITRITE NEGATIVE 02/05/2020 1745   LEUKOCYTESUR NEGATIVE 02/05/2020 1745   Sepsis Labs Invalid input(s): PROCALCITONIN,  WBC,  LACTICIDVEN Microbiology Recent Results (from the past 240 hour(s))  Resp Panel by RT PCR (RSV, Flu A&B, Covid) - Nasopharyngeal Swab     Status: None   Collection Time: 02/05/20  6:14 PM   Specimen: Nasopharyngeal Swab  Result Value Ref Range Status   SARS Coronavirus 2 by RT PCR NEGATIVE NEGATIVE Final    Comment: (NOTE) SARS-CoV-2 target nucleic acids are NOT DETECTED.  The SARS-CoV-2 RNA is generally detectable in upper respiratoy specimens during the acute phase of infection. The lowest concentration of SARS-CoV-2 viral copies this assay can detect is 131 copies/mL. A negative result does not preclude SARS-Cov-2 infection and should not be used as the sole basis for treatment or other patient management decisions. A negative result may occur with  improper specimen collection/handling, submission of specimen other than nasopharyngeal swab, presence of viral mutation(s) within the areas targeted by this assay, and inadequate number of viral copies (<131 copies/mL). A negative result must be combined with clinical observations,  patient history, and epidemiological information. The expected result is Negative.  Fact Sheet for Patients:  https://www.moore.com/  Fact Sheet for Healthcare Providers:  https://www.young.biz/  This test is no t yet approved or cleared by the [place] FDA and  has been authorized for detection and/or  diagnosis of SARS-CoV-2 by FDA under an Emergency Use Authorization (EUA). This EUA will remain  in effect (meaning this test can be used) for the duration of the COVID-19 declaration under Section 564(b)(1) of the Act, 21 U.S.C. section 360bbb-3(b)(1), unless the authorization is terminated or revoked sooner.     Influenza A by PCR NEGATIVE NEGATIVE Final   Influenza B by PCR NEGATIVE NEGATIVE Final    Comment: (NOTE) The Xpert Xpress SARS-CoV-2/FLU/RSV assay is intended as an aid in  the diagnosis of influenza from Nasopharyngeal swab specimens and  should not be used as a sole basis for treatment. Nasal washings and  aspirates are unacceptable for Xpert Xpress SARS-CoV-2/FLU/RSV  testing.  Fact Sheet for Patients: https://www.moore.com/  Fact Sheet for Healthcare Providers: https://www.young.biz/  This test is not yet approved or cleared by the [place] FDA and  has been authorized for detection and/or diagnosis of SARS-CoV-2 by  FDA under an Emergency Use Authorization (EUA). This EUA will remain  in effect (meaning this test can be used) for the duration of the  Covid-19 declaration under Section 564(b)(1) of the Act, 21  U.S.C. section 360bbb-3(b)(1), unless the authorization is  terminated or revoked.    Respiratory Syncytial Virus by PCR NEGATIVE NEGATIVE Final    Comment: (NOTE) Fact Sheet for Patients: https://www.moore.com/  Fact Sheet for Healthcare Providers: https://www.young.biz/  This test is not yet approved or cleared by the [place] FDA and  has been authorized for detection and/or diagnosis of SARS-CoV-2 by  FDA under an Emergency Use Authorization (EUA). This EUA will remain  in effect (meaning this test can be used) for the duration of the  COVID-19 declaration under Section 564(b)(1) of the Act, 21 U.S.C.  section 360bbb-3(b)(1), unless the authorization is  terminated or  revoked. Performed at [facility], [address] [place], [place] [address]   MRSA PCR Screening     Status: None   Collection Time: 02/05/20 10:25 PM   Specimen: Nasopharyngeal  Result Value Ref Range Status   MRSA by PCR NEGATIVE NEGATIVE Final    Comment:        The GeneXpert MRSA Assay (FDA approved for NASAL specimens only), is one component of a comprehensive MRSA colonization surveillance program. It is not intended to diagnose MRSA infection nor to guide or monitor treatment for MRSA infections. Performed at [facility], [address] [place], [place] [address]   Culture, blood (routine x 2)     Status: None   Collection Time: 02/07/20  8:21 AM   Specimen: BLOOD LEFT ARM  Result Value Ref Range Status   Specimen Description BLOOD LEFT ARM  Final   Special Requests   Final    BOTTLES DRAWN AEROBIC AND ANAEROBIC Blood Culture adequate volume   Culture   Final    NO GROWTH 5 DAYS Performed at [facility], [address], [place], [place] [address]    Report Status 02/12/2020 FINAL  Final  Culture, blood (routine x 2)     Status: None   Collection Time: 02/07/20  8:34 AM   Specimen: BLOOD  Result Value Ref Range Status   Specimen Description BLOOD LEFT  ANTECUBITAL  Final   Special Requests   Final    BOTTLES DRAWN AEROBIC AND ANAEROBIC Blood Culture adequate volume   Culture   Final    NO GROWTH 5 DAYS Performed at [facility], [address], [place], [place] [address]    Report Status 02/12/2020 FINAL  Final  Culture, Urine     Status: None   Collection Time: 02/07/20  2:42 PM   Specimen: Urine, Catheterized  Result Value Ref Range Status   Specimen Description URINE, CATHETERIZED  Final   Special Requests NONE  Final   Culture   Final    NO GROWTH Performed at [facility], [address] [place], [place] [address]    Report Status 02/08/2020 FINAL  Final  Surgical PCR screen     Status: None    Collection Time: 02/14/20 11:39 AM   Specimen: Nasal Mucosa; Nasal Swab  Result Value Ref Range Status   MRSA, PCR NEGATIVE NEGATIVE Final   Staphylococcus aureus NEGATIVE NEGATIVE Final    Comment: (NOTE) The Xpert SA Assay (FDA approved for NASAL specimens in patients 39 years of age and older), is one component of a comprehensive surveillance program. It is not intended to diagnose infection nor to guide or monitor treatment. Performed at [facility], [address] [place], [place] [address]      Time coordinating discharge: Over 30 minutes  SIGNED:   Cipriano Bunker, MD  [facility] 02/15/2020, 11:29 AM Pager   If 7PM-7AM, please contact night-coverage [email or web address]

## 2020-02-15 NOTE — Discharge Instructions (Signed)
After Your ICD (Implantable Cardiac Defibrillator)    You have a [employer] ICD  ACTIVITY  Do not lift your arm above shoulder height for 1 week after your procedure. After 7 days, you may progress as below.     Wednesday February 22, 2020  Thursday February 23, 2020 Friday February 24, 2020 Saturday February 25, 2020    Do not lift, push, pull, or carry anything over 10 pounds with the affected arm until 6 weeks (Wednesday March 28, 2020 ) after your procedure.    Do NOT DRIVE until you have been seen for your wound check, or as long as instructed by your healthcare provider.    Ask your healthcare provider when you can go back to work   INCISION/Dressing  If you are on a blood thinner such as Coumadin, Xarelto, Eliquis, Plavix, or Pradaxa please confirm with your provider when this should be resumed.    Monitor your defibrillator site for redness, swelling, and drainage. Call the device clinic at [phone] if you experience these symptoms or fever/chills.   If your incision is sealed with Steri-strips or staples, you may shower 10 days after your procedure or when told by your provider. Do not remove the steri-strips or let the shower hit directly on your site. You may wash around your site with soap and water.     Avoid lotions, ointments, or perfumes over your incision until it is well-healed.   You may use a hot tub or a pool AFTER your wound check appointment if the incision is completely closed.   Your ICD is designed to protect you from life threatening heart rhythms. Because of this, you may receive a shock.   o 1 shock with no symptoms:  Call the office during business hours. o 1 shock with symptoms (chest pain, chest pressure, dizziness, lightheadedness, shortness of breath, overall feeling unwell):  Call 911. o If you experience 2 or more shocks in 24 hours:  Call 911. o If you receive a shock, you should not drive for 6 months per the [place] DMV IF you  receive appropriate therapy from your ICD.    ICD Alerts:  Some alerts are vibratory and others beep. These are NOT emergencies. Please call our office to let us know. If this occurs at night or on weekends, it can wait until the next business day. Send a remote transmission.   If your device is capable of reading fluid status (for heart failure), you will be offered monthly monitoring to review this with you.   DEVICE MANAGEMENT  Remote monitoring is used to monitor your ICD from home. This monitoring is scheduled every 91 days by our office. It allows [place] to keep an eye on the functioning of your device to ensure it is working properly. You will routinely see your Electrophysiologist annually (more often if necessary).    You should receive your ID card for your new device in 4-8 weeks. Keep this card with you at all times once received. Consider wearing a medical alert bracelet or necklace.   Your ICD  may be MRI compatible. This will be discussed at your next office visit/wound check.  You should avoid contact with strong electric or magnetic fields.    Do not use amateur (ham) radio equipment or electric (arc) welding torches. MP3 player headphones with magnets should not be used. Some devices are safe to use if held at least 12 inches (30 cm) from your defibrillator. These include power tools,  lawn mowers, and speakers. If you are unsure if something is safe to use, ask your health care provider.   When using your cell phone, hold it to the ear that is on the opposite side from the defibrillator. Do not leave your cell phone in a pocket over the defibrillator.   You may safely use electric blankets, heating pads, computers, and microwave ovens.  Call the office right away if:  You have chest pain.  You feel more than one shock.  You feel more short of breath than you have felt before.  You feel more light-headed than you have felt before.  Your incision starts to open  up.  This information is not intended to replace advice given to you by your health care provider. Make sure you discuss any questions you have with your health care provider.

## 2020-02-15 NOTE — Progress Notes (Signed)
Pt and husband st she ambulated well this am. Discussed MI, restrictions, low sodium, daily wts, exercise, and CRPII. Pt and husband voiced understanding and requests her referral be sent to [place] CRPII. Might be more interested in virtual program. [phone] Ethelda Chick CES, ACSM 9:10 AM 02/15/2020

## 2020-02-15 NOTE — Progress Notes (Addendum)
Electrophysiology Rounding Note  Patient Name: Kristina Nichols Date of Encounter: 02/15/2020  Primary Cardiologist: No primary care provider on file. Electrophysiologist: Lanier Prude, MD   Subjective   The patient is doing well today.  At this time, the patient denies chest pain, shortness of breath, or any new concerns.  S/p single chamber SJ ICD 02/14/2020 CXR today with stable lead placement and no PTx.   Inpatient Medications    Scheduled Meds: . buPROPion  100 mg Oral TID  . carvedilol  3.125 mg Oral BID WC  . docusate sodium  100 mg Oral BID  . lamoTRIgine  150 mg Oral BID  . mouth rinse  15 mL Mouth Rinse BID  . mexiletine  150 mg Oral Q8H  . pantoprazole  40 mg Oral Daily  . sacubitril-valsartan  1 tablet Oral BID  . spironolactone  25 mg Oral Daily   Continuous Infusions: . sodium chloride Stopped (02/10/20 2015)  .  ceFAZolin (ANCEF) IV Stopped (02/15/20 3151)   PRN Meds: acetaminophen, acetaminophen, clonazepam, influenza vac split quadrivalent PF, levalbuterol, ondansetron (ZOFRAN) IV, polyethylene glycol, polyvinyl alcohol   Vital Signs    Vitals:   02/14/20 2011 02/14/20 2317 02/15/20 0500 02/15/20 0807  BP: 110/62  96/75 116/71  Pulse: 87  97   Resp: 18  19 18   Temp: 98.4 F (36.9 C)  98.2 F (36.8 C) 98.2 F (36.8 C)  TempSrc: Oral  Oral   SpO2: 100% 100% 96%   Weight:   72.9 kg   Height:        Intake/Output Summary (Last 24 hours) at 02/15/2020 0917 Last data filed at 02/15/2020 0639 Gross per 24 hour  Intake 228.91 ml  Output --  Net 228.91 ml   Filed Weights   02/12/20 0500 02/14/20 0500 02/15/20 0500  Weight: 74.7 kg 72.6 kg 72.9 kg    Physical Exam    GEN- The patient is well appearing, alert and oriented x 3 today.   Head- normocephalic, atraumatic Eyes-  Sclera clear, conjunctiva pink Ears- hearing intact Oropharynx- clear Neck- supple Lungs- Clear to ausculation bilaterally, normal work of breathing Heart- Regular  rate and rhythm, no murmurs, rubs or gallops GI- soft, NT, ND, + BS Extremities- no clubbing or cyanosis. No edema Skin- no rash or lesion Psych- euthymic mood, full affect Neuro- strength and sensation are intact  Labs    CBC Recent Labs    02/14/20 0227 02/15/20 0152  WBC 7.9 7.7  HGB 11.7* 11.1*  HCT 36.3 34.0*  MCV 97.3 96.9  PLT 276 291   Basic Metabolic Panel Recent Labs    13/03/21 0227 02/15/20 0152  NA 139 135  K 4.3 4.3  CL 100 100  CO2 28 25  GLUCOSE 109* 126*  BUN 14 15  CREATININE 0.71 0.73  CALCIUM 9.5 8.9  MG 2.0 1.7   Liver Function Tests Recent Labs    02/14/20 0227 02/15/20 0152  AST 38 28  ALT 78* 55*  ALKPHOS 135* 121  BILITOT 0.6 0.4  PROT 6.4* 5.9*  ALBUMIN 3.3* 3.0*   No results for input(s): LIPASE, AMYLASE in the last 72 hours. Cardiac Enzymes No results for input(s): CKTOTAL, CKMB, CKMBINDEX, TROPONINI in the last 72 hours.   Telemetry    NSR 80-90s with occasional PVCs (personally reviewed)  Radiology    DG Chest 2 View  Result Date: 02/15/2020 CLINICAL DATA:  54 year old female status post ICD placement. EXAM: CHEST - 2 VIEW COMPARISON:  Chest x-ray 02/08/2020. FINDINGS: Lung volumes are normal. No consolidative airspace disease. No pleural effusions. No pneumothorax. No pulmonary nodule or mass noted. Pulmonary vasculature and the cardiomediastinal silhouette are within normal limits. New left-sided pacemaker/AICD with lead tip projecting over the expected location of the right ventricular apex. IMPRESSION: 1. New pacemaker/AICD which appears appropriately located. No pneumothorax or other immediate complicating features. Electronically Signed   By: Trudie Reed M.D.   On: 02/15/2020 07:59   EP PPM/ICD IMPLANT  Result Date: 02/14/2020 CONCLUSIONS:  1. Non- Ischemic cardiomyopathy with recurrent VF.  2. Successful ICD implantation.  3. DFT less than or equal to 15 joules.  4. No early apparent complications. ICD Criteria  Current LVEF:55%. Within 12 months prior to implant: Yes Heart failure history: Yes, Class I Cardiomyopathy history: Yes, Non-Ischemic Cardiomyopathy. Atrial Fibrillation/Atrial Flutter: No. Ventricular tachycardia history: No. Cardiac arrest history: Yes, Ventricular Fibrillation. History of syndromes with risk of sudden death: No. Previous ICD: No. Current ICD indication: Secondary PPM indication: No.  Class I or II Bradycardia indication present: No Beta Blocker therapy for 3 or more months: no. Ace Inhibitor/ARB therapy for 3 or more months: No, medical reason. Lewayne Bunting, MD 3:33 PM 02/14/2020   MR CARDIAC MORPHOLOGY W WO CONTRAST  Result Date: 02/13/2020 CLINICAL DATA:  Cardiomyopathy of uncertain etiology EXAM: CARDIAC MRI TECHNIQUE: The patient was scanned on a 1.5 Tesla GE magnet. A dedicated cardiac coil was used. Functional imaging was done using Fiesta sequences. 2,3, and 4 chamber views were done to assess for RWMA's. Modified Simpson's rule using a short axis stack was used to calculate an ejection fraction on a dedicated work [employer]. The patient received 8 cc of Gadavist. After 10 minutes inversion recovery sequences were used to assess for infiltration and scar tissue. FINDINGS: Fluid in fissure on right, otherwise no gross lung abnormalities. Normal left ventricular size and wall thickness, EF 56% with normal wall motion. Normal right ventricular size and systolic function, EF 47%. Mild biatrial enlargement. Trileaflet aortic valve, no significant regurgitation or stenosis. No significant mitral regurgitation noted. On delayed enhancement imaging, there was no myocardial late gadolinium enhancement (LGE). Measurements: LVEDV 113 mL LVSV 63 mL LVEF 56% RVEDV 95 mL RVSV 44 mL RVEF 47% IMPRESSION: 1.  Normal LV size and systolic function, EF 56%. 2.  Normal RV size and systolic function, EF 47%. 3. No myocardial LGE, so no definitive evidence for prior MI, myocarditis, or  infiltrative disease. Significant improvement in LV systolic function compared to prior echo. Suggestive of recovery of Takotsubo cardiomyopathy. [name] Electronically Signed   By: Marca Ancona M.D.   On: 02/13/2020 13:23    Patient Profile     54 y.o.femalewith a hx of depression/anxiety, hypothyroidism (off tx), GERD,chronic granulomatosis carrier (?), no noted past cardiac history admitted with VF arrest  She has had recurrent TdP requiring multiple defibrillations  Assessment & Plan    1. VT/VF: S/p St Jude ICD 02/14/2020 CXR this am with stable lead placement and no PTx.  Will schedule for usual wound and ICD follow up with Dr. Lalla Brothers.   2. Acute systolic CHF/cardiogenic shock: EF <20%on echo (no LV thrombus), initial cath with normal coronaries. Concern for stress (Takotsubo-type) cardiomyopathy. Recent emotional stress. cMRI with normalization of EF.  Clinically stable per CHF team.   3. Anxiety Per primary.  EP to see as needed while here. Reviewed wound care and arm restrictions.   For questions or updates, please contact [facility] Please consult [email or web address] for contact info under Cardiology/STEMI.  Signed, Graciella Freer, PA-C  02/15/2020, 9:17 AM   EP Attending  Patient seen and examined. Agree with above. The patient is doing well after ICD insertion. She denies chest pain or sob. Interrogation of her ICD demonstrates normal device function. She will be discharged home with the usual followup.   Sharlot Gowda Domingo Fuson,MD

## 2020-02-15 NOTE — Progress Notes (Addendum)
Patient ID: Kristina Nichols, female   DOB: Nov 06, 1965, 54 y.o.   MRN: [id number]     Advanced Heart Failure Rounding Note  PCP-Cardiologist: No primary care provider on file.   Subjective:    10/25 S/P IABP.  Temp pacer removed. 10/27 IABP removed.  10/28 Extubated. Placed back on Bipap for several hours. Given solumedrol and later weaned to 4 liters. Failed swallow study.  10/29 Passed swallow eval, Swan out.  11/2 St Jude ICD placed.   No complaints, no chest pain or dyspnea.    Cardiac MRI: 1.  Normal LV size and systolic function, EF 56%. 2.  Normal RV size and systolic function, EF 47%. 3. No myocardial LGE, so no definitive evidence for prior MI, myocarditis, or infiltrative disease.   Objective:   Weight Range: 72.9 kg Body mass index is 26.74 kg/m.   Vital Signs:   Temp:  [98.2 F (36.8 C)-98.4 F (36.9 C)] 98.2 F (36.8 C) (11/03 0500) Pulse Rate:  [0-142] 97 (11/03 0500) Resp:  [0-22] 19 (11/03 0500) BP: (84-141)/(52-86) 96/75 (11/03 0500) SpO2:  [0 %-100 %] 96 % (11/03 0500) Weight:  [72.9 kg] 72.9 kg (11/03 0500) Last BM Date: 02/13/20  Weight change: Filed Weights   02/12/20 0500 02/14/20 0500 02/15/20 0500  Weight: 74.7 kg 72.6 kg 72.9 kg    Intake/Output:   Intake/Output Summary (Last 24 hours) at 02/15/2020 0806 Last data filed at 02/15/2020 6546 Gross per 24 hour  Intake 228.91 ml  Output --  Net 228.91 ml      Physical Exam   General: NAD Neck: No JVD, no thyromegaly or thyroid nodule.  Lungs: Clear to auscultation bilaterally with normal respiratory effort. CV: Nondisplaced PMI.  Heart regular S1/S2, no S3/S4, no murmur.  No peripheral edema.   Abdomen: Soft, nontender, no hepatosplenomegaly, no distention.  Skin: Intact without lesions or rashes.  Neurologic: Alert and oriented x 3.  Psych: Normal affect. Extremities: No clubbing or cyanosis.  HEENT: Normal.    Telemetry   NSR with occasional PVCs, no VT (personally reviewed).    EKG    N/a   Labs    CBC Recent Labs    02/14/20 0227 02/15/20 0152  WBC 7.9 7.7  HGB 11.7* 11.1*  HCT 36.3 34.0*  MCV 97.3 96.9  PLT 276 291   Basic Metabolic Panel Recent Labs    50/35/46 0227 02/15/20 0152  NA 139 135  K 4.3 4.3  CL 100 100  CO2 28 25  GLUCOSE 109* 126*  BUN 14 15  CREATININE 0.71 0.73  CALCIUM 9.5 8.9  MG 2.0 1.7   Liver Function Tests Recent Labs    02/14/20 0227 02/15/20 0152  AST 38 28  ALT 78* 55*  ALKPHOS 135* 121  BILITOT 0.6 0.4  PROT 6.4* 5.9*  ALBUMIN 3.3* 3.0*   No results for input(s): LIPASE, AMYLASE in the last 72 hours. Cardiac Enzymes No results for input(s): CKTOTAL, CKMB, CKMBINDEX, TROPONINI in the last 72 hours.  BNP: BNP (last 3 results) Recent Labs    02/11/20 1335  BNP 88.4    ProBNP (last 3 results) No results for input(s): PROBNP in the last 8760 hours.   D-Dimer No results for input(s): DDIMER in the last 72 hours. Hemoglobin A1C No results for input(s): HGBA1C in the last 72 hours. Fasting Lipid Panel No results for input(s): CHOL, HDL, LDLCALC, TRIG, CHOLHDL, LDLDIRECT in the last 72 hours. Thyroid Function Tests No results for input(s): TSH, T4TOTAL,  T3FREE, THYROIDAB in the last 72 hours.  Invalid input(s): FREET3  Other results:   Imaging    DG Chest 2 View  Result Date: 02/15/2020 CLINICAL DATA:  54 year old female status post ICD placement. EXAM: CHEST - 2 VIEW COMPARISON:  Chest x-ray 02/08/2020. FINDINGS: Lung volumes are normal. No consolidative airspace disease. No pleural effusions. No pneumothorax. No pulmonary nodule or mass noted. Pulmonary vasculature and the cardiomediastinal silhouette are within normal limits. New left-sided pacemaker/AICD with lead tip projecting over the expected location of the right ventricular apex. IMPRESSION: 1. New pacemaker/AICD which appears appropriately located. No pneumothorax or other immediate complicating features. Electronically Signed    By: Trudie Reed M.D.   On: 02/15/2020 07:59   EP PPM/ICD IMPLANT  Result Date: 02/14/2020 CONCLUSIONS:  1. Non- Ischemic cardiomyopathy with recurrent VF.  2. Successful ICD implantation.  3. DFT less than or equal to 15 joules.  4. No early apparent complications. ICD Criteria Current LVEF:55%. Within 12 months prior to implant: Yes Heart failure history: Yes, Class I Cardiomyopathy history: Yes, Non-Ischemic Cardiomyopathy. Atrial Fibrillation/Atrial Flutter: No. Ventricular tachycardia history: No. Cardiac arrest history: Yes, Ventricular Fibrillation. History of syndromes with risk of sudden death: No. Previous ICD: No. Current ICD indication: Secondary PPM indication: No.  Class I or II Bradycardia indication present: No Beta Blocker therapy for 3 or more months: no. Ace Inhibitor/ARB therapy for 3 or more months: No, medical reason. Lewayne Bunting, MD 3:33 PM 02/14/2020     Medications:     Scheduled Medications: . buPROPion  100 mg Oral TID  . carvedilol  3.125 mg Oral BID WC  . docusate sodium  100 mg Oral BID  . lamoTRIgine  150 mg Oral BID  . mouth rinse  15 mL Mouth Rinse BID  . mexiletine  150 mg Oral Q8H  . pantoprazole  40 mg Oral Daily  . sacubitril-valsartan  1 tablet Oral BID  . spironolactone  25 mg Oral Daily    Infusions: . sodium chloride Stopped (02/10/20 2015)  .  ceFAZolin (ANCEF) IV Stopped (02/15/20 2536)    PRN Medications: acetaminophen, acetaminophen, clonazepam, influenza vac split quadrivalent PF, levalbuterol, ondansetron (ZOFRAN) IV, polyethylene glycol, polyvinyl alcohol    Patient Profile  Kristina Nichols is a 54 year old with h/o witnessed cardiac arrest at homewith refractory vib/ torsades. Intubated in ER. Taken for [facility] with clean coronaries, findings consistent with stress induced cardiomyopathy, LVEF <25%.   Cardiogenic shock/torsades. Multiple shocks.   Assessment/Plan   1. VT: Recurrent torsades with multiple shocks, also triggered by  RV pacing from temporary wire (removed). Coronaries normal. Amiodarone stopped due to prolonged QT interval, on lidocaine now to mexiletine.  No further VT. I suspect that prolonged QT is the sequelae of stress (Takotsubo-type) cardiomyopathy.  Most recent ECG with resolution of QT prolongation. Now with St Jude ICD.  - Continue mexiletine.  - Continue Coreg.  2. Acute systolic CHF/cardiogenic shock: EF <20% on echo (no LV thrombus), initial cath with normal coronaries. Concern for stress (Takotsubo-type) cardiomyopathy. Recent emotional stress. Initially required IABP and norepinephrine, now weaned off and stable.  Cardiac MRI done yesterday showed LV EF back up to normal range (EF 56%) with no delayed enhancement => this is supportive of stress cardiomyopathy.  Not volume overloaded on exam.  - Digoxin stopped with recovery of EF.  - Continue current doses of spironolactone, Coreg, and Entresto for now.  If followup echo in 3 months or so remains normal EF, will probably  drop Entresto but continue the other two.    3. Psych/neuro: Significant anxiety at baseline.  4. Acute hypoxemic respiratory failure: On vent for several days. CXR with bilateral patchy infiltrates, ?aspiration in setting of cardiac arrest. Now resolved, extubated, CXR has cleared.  Off abx.  5. Elevated LFTs: Suspect shock liver, trending down.  6. ID: Initially treated with abx for PNA, now off.   7. Thrombocytopenia: Suspect due to IABP, resolved  She is ready for home today, followup in CHF clinic and device clinic.  Cardiac meds for discharge: mexiletine 150 mg bid, spironolactone 25 mg daily, Coreg 3.125 mg bid, Entresto 24/26 bid.   Length of Stay: 10  Marca Ancona, MD  02/15/2020, 8:06 AM  Advanced Heart Failure Team Pager [phone] (M-F; 7a - 4p)  Please contact [facility] for night-coverage after hours (4p -7a ) and weekends on [email or web address]

## 2020-02-22 ENCOUNTER — Encounter (HOSPITAL_COMMUNITY): Payer: BC Managed Care – PPO

## 2020-02-24 ENCOUNTER — Telehealth (HOSPITAL_COMMUNITY): Payer: Self-pay

## 2020-02-24 NOTE — Telephone Encounter (Signed)
Called and left message with pt husband Thayer Ohm to have pt return phone call to CR.

## 2020-02-24 NOTE — Telephone Encounter (Signed)
Pt insurance is active and benefits verified through [place]. Co-pay $50.00, DED $0.00/$0.00 met, out of pocket $3,000.00/$392.56 met, co-insurance 0%. No pre-authorization required. Passport, 02/24/20 @ 11:44AM, [id number]  Will contact patient to see if she is interested in the Cardiac Rehab Program. If interested, patient will need to complete follow up appt. Once completed, patient will be contacted for scheduling upon review by the RN Navigator.

## 2020-02-26 NOTE — Progress Notes (Signed)
x

## 2020-02-27 ENCOUNTER — Ambulatory Visit (INDEPENDENT_AMBULATORY_CARE_PROVIDER_SITE_OTHER): Payer: BC Managed Care – PPO | Admitting: Emergency Medicine

## 2020-02-27 ENCOUNTER — Other Ambulatory Visit: Payer: Self-pay

## 2020-02-27 DIAGNOSIS — I472 Ventricular tachycardia, unspecified: Secondary | ICD-10-CM

## 2020-02-27 DIAGNOSIS — I469 Cardiac arrest, cause unspecified: Secondary | ICD-10-CM

## 2020-02-27 LAB — CUP PACEART INCLINIC DEVICE CHECK
Battery Remaining Longevity: 105 mo
Brady Statistic RV Percent Paced: 0.08 %
Date Time Interrogation Session: 20211115124300
HighPow Impedance: 58.5 Ohm
Implantable Lead Implant Date: 20211102
Implantable Lead Location: 753860
Implantable Pulse Generator Implant Date: 20211102
Lead Channel Impedance Value: 450 Ohm
Lead Channel Pacing Threshold Amplitude: 0.75 V
Lead Channel Pacing Threshold Amplitude: 0.75 V
Lead Channel Pacing Threshold Pulse Width: 0.5 ms
Lead Channel Pacing Threshold Pulse Width: 0.5 ms
Lead Channel Sensing Intrinsic Amplitude: 12 mV
Lead Channel Setting Pacing Amplitude: 3.5 V
Lead Channel Setting Pacing Pulse Width: 0.5 ms
Lead Channel Setting Sensing Sensitivity: 0.5 mV
Pulse Gen Serial Number: 111028702

## 2020-02-27 NOTE — Progress Notes (Signed)
Wound check appointment. Steri-strips removed. Wound without redness or edema. Incision edges approximated, wound well healed.  Noted distal aspect of incision appears raised, a stitch under the skin is palpable, skin currently intact, patient educated to allow to heal naturally.   Normal device function. Thresholds, sensing, and impedances consistent with implant measurements. Device programmed at 3.5V for extra safety margin until 3 month visit. Histogram distribution appropriate for patient and level of activity. No ventricular arrhythmias noted. Patient educated about wound care, arm mobility, lifting restrictions, shock plan. Pt enrolled in remote monitoring, next scheduled check 05/15/20.  ROV with Dr. Ladona Ridgel 05/22/20.

## 2020-03-01 ENCOUNTER — Ambulatory Visit: Payer: BC Managed Care – PPO

## 2020-03-05 ENCOUNTER — Other Ambulatory Visit: Payer: Self-pay

## 2020-03-05 ENCOUNTER — Ambulatory Visit (HOSPITAL_COMMUNITY)
Admission: RE | Admit: 2020-03-05 | Discharge: 2020-03-05 | Disposition: A | Discharge status: Home / Self Care | Payer: BC Managed Care – PPO | Source: Ambulatory Visit | Attending: Cardiology | Admitting: Cardiology

## 2020-03-05 ENCOUNTER — Telehealth (HOSPITAL_COMMUNITY): Payer: Self-pay | Admitting: Pharmacist

## 2020-03-05 ENCOUNTER — Encounter (HOSPITAL_COMMUNITY): Payer: Self-pay

## 2020-03-05 VITALS — BP 128/80 | HR 73 | Wt 161.0 lb

## 2020-03-05 DIAGNOSIS — Z95810 Presence of automatic (implantable) cardiac defibrillator: Secondary | ICD-10-CM | POA: Insufficient documentation

## 2020-03-05 DIAGNOSIS — I472 Ventricular tachycardia: Secondary | ICD-10-CM | POA: Diagnosis not present

## 2020-03-05 DIAGNOSIS — I5022 Chronic systolic (congestive) heart failure: Secondary | ICD-10-CM

## 2020-03-05 DIAGNOSIS — I5181 Takotsubo syndrome: Secondary | ICD-10-CM | POA: Diagnosis not present

## 2020-03-05 DIAGNOSIS — Z79899 Other long term (current) drug therapy: Secondary | ICD-10-CM | POA: Diagnosis not present

## 2020-03-05 DIAGNOSIS — R9431 Abnormal electrocardiogram [ECG] [EKG]: Secondary | ICD-10-CM | POA: Diagnosis not present

## 2020-03-05 DIAGNOSIS — I469 Cardiac arrest, cause unspecified: Secondary | ICD-10-CM | POA: Insufficient documentation

## 2020-03-05 DIAGNOSIS — F32A Depression, unspecified: Secondary | ICD-10-CM | POA: Diagnosis not present

## 2020-03-05 DIAGNOSIS — F419 Anxiety disorder, unspecified: Secondary | ICD-10-CM | POA: Diagnosis not present

## 2020-03-05 LAB — COMPREHENSIVE METABOLIC PANEL
ALT: 15 U/L (ref 0–44)
AST: 18 U/L (ref 15–41)
Albumin: 3.8 g/dL (ref 3.5–5.0)
Alkaline Phosphatase: 83 U/L (ref 38–126)
Anion gap: 10 (ref 5–15)
BUN: 11 mg/dL (ref 6–20)
CO2: 26 mmol/L (ref 22–32)
Calcium: 9.2 mg/dL (ref 8.9–10.3)
Chloride: 107 mmol/L (ref 98–111)
Creatinine, Ser: 0.97 mg/dL (ref 0.44–1.00)
GFR, Estimated: >60 mL/min (ref >60)
Glucose, Bld: 90 mg/dL (ref 70–99)
Potassium: 4 mmol/L (ref 3.5–5.1)
Sodium: 143 mmol/L (ref 135–145)
Total Bilirubin: 0.6 mg/dL (ref 0.3–1.2)
Total Protein: 6.5 g/dL (ref 6.5–8.1)

## 2020-03-05 LAB — MAGNESIUM: Magnesium: 2 mg/dL (ref 1.7–2.4)

## 2020-03-05 NOTE — Patient Instructions (Signed)
It was great to see you today! No medication changes are needed at this time.   Labs today We will only contact you if something comes back abnormal or we need to make some changes. Otherwise no news is good news!  Your physician recommends that you schedule a follow-up appointment in: 4 weeks with Pharmacy team  Your physician recommends that you schedule a follow-up appointment in: 3 months with Dr Shirlee Latch and echo  Your physician has requested that you have an echocardiogram. Echocardiography is a painless test that uses sound waves to create images of your heart. It provides your doctor with information about the size and shape of your heart and how well your heart's chambers and valves are working. This procedure takes approximately one hour. There are no restrictions for this procedure.   If you have any questions or concerns before your next appointment please send [place] a message through [place] or call our office at [phone].    TO LEAVE A MESSAGE FOR THE NURSE SELECT OPTION 2, PLEASE LEAVE A MESSAGE INCLUDING: . YOUR NAME . DATE OF BIRTH . CALL BACK NUMBER . REASON FOR CALL**this is important as we prioritize the call backs  YOU WILL RECEIVE A CALL BACK THE SAME DAY AS LONG AS YOU CALL BEFORE 4:00 PM

## 2020-03-05 NOTE — Progress Notes (Signed)
Advanced Heart Failure Clinic Note   Referring Physician: PCP: Patient, No Pcp Per PCP-Cardiologist: Dr. Shirlee Latch  EP: Dr. Ladona Ridgel   HPI:  Kristina Nichols is a 54 year old with h/o depression, recent witnessed out of hospital cardiac arrest 10/21 (received 7 minutes of CPR) at [facility] refractory vib/ torsades.  Over the past few months she has been under a lot of stress. Witnessed cardiac arrest at home(CPR 7 min) with refractory vib/ torsades. Had cardioversion at home followed by transport to [facility]. Intubated in ER. Drug screen + benzo + amphetamines. HS Trop [id number]. Lactic Acid 2.4>1.3>1.9. Taken for Cath and this showed with normal coronaries with pacing wire left in place. LV gram EF 20% concerning for Takotsubo. Required multiple cardioversion's post cath for recurrent torsades. On amio drip initially but had prolonged QTc so amio was stopped. Lidocaine 2 mg started and on Norepi 2 mcg. Had recurrent torsades again and received another shock. EP/CT consulted.  ICD placed. Placed on GDMT for systolic heart failure. Mexiletine added for antiarrhymic therapy. Had cMRI prior to d/c which showed interval improvement in LVEF up to 56% suggestive of recovery of Takotsubo cardiomyopathy.  She presents to clinic today for post hospital f/u. Here w/ husband today. Very anxious. Denies exertional or resting dyspnea. Wt stable at home. BP 130s systolic. Tolerating meds ok w/o side effects. She was unable to get Entresto from pharmacy post d/c due to  issues w/ insurance coverage (needs PA).  QT/QTc stable on EKG, 370/450 Kristina. No ICD shocks.   Device pocket stable. Had f/u in device clinic last week. No ICD shocks.    Review of systems complete and found to be negative unless listed in HPI.      Past Medical History:  Diagnosis Date  . Anxiety   . Depression   . Hyperlipidemia   . Hypothyroidism   . Insomnia     Current Outpatient Medications  Medication Sig Dispense Refill  .  acetaminophen (TYLENOL) 500 MG tablet Take 500-1,000 mg by mouth every 6 (six) hours as needed for mild pain or headache.    Marland Kitchen buPROPion (WELLBUTRIN XL) 150 MG 24 hr tablet Take 150 mg by mouth at bedtime.    Marland Kitchen buPROPion (WELLBUTRIN XL) 300 MG 24 hr tablet Take 300 mg by mouth in the morning.    . carvedilol (COREG) 3.125 MG tablet Take 1 tablet (3.125 mg total) by mouth 2 (two) times daily with a meal. 60 tablet 1  . clonazePAM (KLONOPIN) 1 MG tablet Take 1 mg by mouth 3 (three) times daily as needed for anxiety.    . lamoTRIgine (LAMICTAL) 150 MG tablet Take 150 mg by mouth 2 (two) times daily.    Marland Kitchen mexiletine (MEXITIL) 150 MG capsule Take 1 capsule (150 mg total) by mouth 2 (two) times daily. 60 capsule 1  . omeprazole (PRILOSEC OTC) 20 MG tablet Take 20 mg by mouth daily as needed (for heartburn or indigestion).    Marland Kitchen spironolactone (ALDACTONE) 25 MG tablet Take 1 tablet (25 mg total) by mouth daily. 30 tablet 1  . sacubitril-valsartan (ENTRESTO) 24-26 MG Take 1 tablet by mouth 2 (two) times daily. (Patient not taking: Reported on 02/27/2020) 60 tablet 1   No current facility-administered medications for this encounter.    No Known Allergies    Social History   Socioeconomic History  . Marital status: Married    Spouse name: Shauntelle Jamerson  . Number of children: Not on file  . Years of  education: Not on file  . Highest education level: Not on file  Occupational History  . Not on file  Tobacco Use  . Smoking status: Never Smoker  . Smokeless tobacco: Never Used  Vaping Use  . Vaping Use: Never used  Substance and Sexual Activity  . Alcohol use: Not Currently  . Drug use: Never  . Sexual activity: Not on file  Other Topics Concern  . Not on file  Social History Narrative  . Not on file   Social Determinants of Health   Financial Resource Strain:   . Difficulty of Paying Living Expenses: Not on file  Food Insecurity:   . Worried About Programme researcher, broadcasting/film/video in the Last Year:  Not on file  . Ran Out of Food in the Last Year: Not on file  Transportation Needs:   . Lack of Transportation (Medical): Not on file  . Lack of Transportation (Non-Medical): Not on file  Physical Activity:   . Days of Exercise per Week: Not on file  . Minutes of Exercise per Session: Not on file  Stress:   . Feeling of Stress : Not on file  Social Connections:   . Frequency of Communication with Friends and Family: Not on file  . Frequency of Social Gatherings with Friends and Family: Not on file  . Attends Religious Services: Not on file  . Active Member of Clubs or Organizations: Not on file  . Attends Banker Meetings: Not on file  . Marital Status: Not on file  Intimate Partner Violence:   . Fear of Current or Ex-Partner: Not on file  . Emotionally Abused: Not on file  . Physically Abused: Not on file  . Sexually Abused: Not on file      Family History  Problem Relation Age of Onset  . Alzheimer's disease Father   . Chronic granulomatous disease Son     Vitals:   03/05/20 1436  BP: 128/80  Pulse: 73  SpO2: 98%  Weight: 73 kg     PHYSICAL EXAM: General:  Well appearing. No respiratory difficulty. A bit anxious  HEENT: normal Neck: supple. no JVD. Carotids 2+ bilat; no bruits. No lymphadenopathy or thyromegaly appreciated. Cor: PMI nondisplaced. Regular rate & rhythm. No rubs, gallops or murmurs. Lungs: clear Abdomen: soft, nontender, nondistended. No hepatosplenomegaly. No bruits or masses. Good bowel sounds. Extremities: no cyanosis, clubbing, rash, edema Neuro: alert & oriented x 3, cranial nerves grossly intact. moves all 4 extremities w/o difficulty. Affect pleasant.  ECG: NSR w/ frequent PVCs, QT/QTc 370/450 Kristina   ASSESSMENT & PLAN:  1. VT: Had out of hospital cardiac arrest 10/21 due to recurrent torsades with multiple shocks, also triggered by RV pacing from temporary wire(removed). Coronaries normal. Amiodarone stopped due to prolonged  QT interval. Suspect that prolonged QT was the sequelae of stress (Takotsubo-type) cardiomyopathy. EKG today w/ resolution of QT prolongation, 370/450 Kristina. S/p St Jude ICD. On mexiletine for maintenance therapy. No ICD shocks    - Continue mexiletine 150 mg bid  - Continue Coreg 3.125 mg bid  2. Takotsubo CM: Echo 10/21 w/  EF <20% (no LV thrombus), initial cath with normal coronaries. Concern for stress (Takotsubo-type) cardiomyopathy. Recent emotional stress. Cardiac MRI 11/21 showed LV EF back up to normal range (EF 56%) with no delayed enhancement => this is supportive of stress cardiomyopathy.  NYHA Class II. Euvolemic on exam  - Continue Entresto 25-26 mg bid.  - Digoxin stopped with recovery of EF.  -  Continue current doses of spironolactone and Coreg   If followup echo in 3 months or so remains normal EF, will probably drop Entresto but continue the other two.   - check BMP today   3. Psych/neuro: Significant anxiety at baseline.    F/u w/ PharmD in 3-4 weeks     Robbie Lis, PA-C 03/05/20

## 2020-03-05 NOTE — Telephone Encounter (Signed)
Patient Advocate Encounter   Received notification from [employer] that prior authorization for Sherryll Burger is required.   PA submitted on CoverMyMeds Key [id number] Status is pending   Will continue to follow.   Karle Plumber, PharmD, BCPS, BCCP, CPP Heart Failure Clinic Pharmacist [phone]

## 2020-03-06 ENCOUNTER — Encounter (HOSPITAL_COMMUNITY): Payer: Self-pay

## 2020-03-06 NOTE — Telephone Encounter (Signed)
Advanced Heart Failure Patient Advocate Encounter  Prior Authorization for Sherryll Burger has been approved.    Effective dates: 03/06/20 through 03/05/21  Patients co-pay is $40 (insurance only allows a 30 day supply)  Patient was given a $10 co-pay card in office. She knows to activate and take it to the pharmacy.  Advised her to call me with any issues.  Archer Asa, CPhT

## 2020-03-06 NOTE — Progress Notes (Signed)
Cardiac Rehab Note:  Patient seen in HF clinic 03/05/20. Doing well. Prolonged QT resolved on EKG. Tolerating medications. No shocks when device interrogated in clinic. Pt is making the expected progress in recovery.  Pt appropriate for scheduling for on site cardiac rehab and/or enrollment in Virtual Cardiac Rehab.  Pt Covid Risk Score is 1. Will forward to staff for follow up.  Venicia Vandall E. Suzie Portela RN, BSN [place]. [facility]  Cardiac and Pulmonary Rehabilitation Phone: [phone] Fax: [phone]

## 2020-03-15 ENCOUNTER — Encounter (HOSPITAL_COMMUNITY): Payer: Self-pay

## 2020-03-15 ENCOUNTER — Telehealth (HOSPITAL_COMMUNITY): Payer: Self-pay

## 2020-03-15 NOTE — Telephone Encounter (Signed)
Attempted to call patient in regards to Cardiac Rehab - LM on VM Mailed letter 

## 2020-03-19 NOTE — Progress Notes (Incomplete)
***In Progress*** PCP: Patient, No Pcp Per PCP-Cardiologist: Dr. Shirlee Latch  EP: Dr. Ladona Ridgel   HPI:  Ms Schabel is a 54 year old with h/o depression,recent witnessed out of hospital cardiac arrest 10/21(received 7 minutes of CPR)at homewith refractory vfib/ torsades.  Over the past few months she had been under a lot of stress. Witnessed cardiac arrest at home(CPR 7 min)with refractory vfib/ torsades. Had cardioversion at home followed by transport to [facility].Intubated in ER.Drug screen + benzo + amphetamines.HS Trop [id number]. Lactic Acid 2.4>1.3>1.9. Taken forCathand this showedwith normal coronarieswith pacing wire left in place. LV gram EF 20% concerning for Takotsubo. Required multiple cardioversion's post cath for recurrent torsades. On amio drip initially but had prolongedQTc so amio was stopped. Lidocaine 2 mg started and on Norepi 2 mcg. Had recurrent torsades again and received another shock. EP/CT consulted.ICD placed. Placed on GDMT for systolic heart failure. Mexiletine added for antiarrhythmic therapy. Had cMRI prior to d/c which showed interval improvement in LVEF up to 56% suggestive of recovery of Takotsubo cardiomyopathy.  She presented to HF clinic on 03/05/20 for post hospital f/u. Came with her husband. Was very anxious. Denied exertional or resting dyspnea. Weight was stable at home. BP 130s systolic. Was tolerating medications ok w/o side effects. She was unable to get Entresto from pharmacy post d/c due to  issues w/ insurance coverage (needed PA) - this has since resolved.  QT/QTc was stable on EKG, 370/450 ms. No ICD shocks.   Today he returns to HF clinic for pharmacist medication titration. At last visit with [facility], Sherryll Burger was restarted (had not been able to get in outpatient setting as a PA was required).    . Shortness of breath/dyspnea on exertion? {YES [id number]  . Orthopnea/PND? {YES [id number] . Edema? {YES [id number] . Lightheadedness/dizziness?  {YES [id number] . Daily weights at home? {YES [id number] . Blood pressure/heart rate monitoring at home? {YES [id number] . Following low-sodium/fluid-restricted diet? {YES NO:22349}  HF Medications: Carvedilol 3.125 mg BID Entresto 24/26 mg BID Spironolactone 25 mg daily  Has the patient been experiencing any side effects to the medications prescribed?  {YES NO:22349}  Does the patient have any problems obtaining medications due to transportation or finances?   {YES NO:22349}  Understanding of regimen: {excellent/good/fair/poor:19665} Understanding of indications: {excellent/good/fair/poor:19665} Potential of compliance: {excellent/good/fair/poor:19665} Patient understands to avoid NSAIDs. Patient understands to avoid decongestants.    Pertinent Lab Values: . Serum creatinine ***, BUN ***, Potassium ***, Sodium ***, BNP ***, Magnesium ***, Digoxin ***   Vital Signs: . Weight: *** (last clinic weight: ***) . Blood pressure: ***  . Heart rate: ***   Assessment: 1. VT: Had out of hospital cardiac arrest 10/21 due to recurrent torsades with multiple shocks, also triggered by RV pacing from temporary wire(removed). Coronaries normal. Amiodarone stopped due to prolonged QT interval. Suspect that prolonged QT was the sequelae of stress (Takotsubo-type) cardiomyopathy. EKG 03/05/20 w/ resolution of QT prolongation, 370/450 ms. S/p St Jude ICD. - On mexiletine for maintenance therapy. No ICD shocks   - Continue mexiletine 150 mg BID  - Continue carvedilol 3.125 mg BID  2. Takotsubo CM: Echo 10/21 w/  EF <20% (no LV thrombus), initial cath with normal coronaries. Concern for stress (Takotsubo-type) cardiomyopathy. Recent emotional stress. Cardiac MRI 11/21 showed LV EF back up to normal range (EF 56%) with no delayed enhancement =>this is supportive of stress cardiomyopathy.  - NYHA Class II. Euvolemic on exam  - Continue carvedilol 3.125 mg BID - Continue Entresto 25-26  mg BID. -  Continue spironolactone 25 mg daily  - Digoxin previously stopped with recovery of EF.  - If followup echo in 3 months or so remains normal EF, will probably drop Entresto but continue the other two.  - check BMP today  3. Psych/neuro: Significant anxiety at baseline.    Plan: 1) Medication changes: Based on clinical presentation, vital signs and recent labs will *** 2) Labs: *** 3) Follow-up: ***   Karle Plumber, PharmD, BCPS, BCCP, CPP Heart Failure Clinic Pharmacist [phone]

## 2020-03-29 ENCOUNTER — Telehealth (HOSPITAL_COMMUNITY): Payer: Self-pay

## 2020-03-29 NOTE — Telephone Encounter (Signed)
No response from pt.  Closed referral  

## 2020-04-02 ENCOUNTER — Inpatient Hospital Stay (HOSPITAL_COMMUNITY): Admission: RE | Admit: 2020-04-02 | Payer: BC Managed Care – PPO | Source: Ambulatory Visit

## 2020-04-15 NOTE — Progress Notes (Incomplete)
***In Progress*** Referring Physician: PCP: Patient, No Pcp Per PCP-Cardiologist: Dr. Shirlee Latch  EP: Dr. Ladona Ridgel   HPI:  Ms Stachnik is a 55 year old with h/o depression,recent witnessed out of hospital cardiac arrest 10/21(received 7 minutes of CPR)at homewith refractory vfib/ torsades.  Over the past few months, she has been under a lot of stress. Witnessed cardiac arrest at home(CPR 7 min)with refractory vfib/ torsades. Had cardioversion at home followed by transport to [facility] and intubated in ER.Drug screen was positive for benzodiazepines and amphetamines.HS Trop [id number]. Lactic Acid 2.4>1.3>1.9. Cath showednormal coronarieswith pacing wire left in place. LV gram EF 20% concerning for Takotsubo. Required multiple cardioversions post-cath for recurrent torsades. Was on amiodarone infusion initially but this was stopped due to prolongedQTc. Lidocaine and norepinephrine infusion was started. Had recurrent torsades again and received another shock. EP/CT consulted.ICD placed. Placed on GDMT for systolic heart failure. Mexiletine added for antiarrhymic therapy. Had cMRI prior to d/c which showed interval improvement in LVEF up to 56% suggestive of recovery of Takotsubo cardiomyopathy.  She recently presented to clinic on 03/05/20 for post-hospital follow-up with Robbie Lis, PA-C. She was very anxious. Denied exertional or resting dyspnea. Wt stable at home. SBP 130s. Was tolerating meds fine w/o side effects. She was unable to get Entresto from pharmacy post d/c due to issues w/ insurance coverage (needs PA).  QT/QTc stable on EKG, 370/450 ms. No ICD shocks, device pocket stable.   Today he returns to HF clinic for pharmacist medication titration. At last visit in [facility], no medication changes were made. Since then, [facility] PA was approved and patient was given $10 copay card.  128/80, 73, 161 lbs - Need BMET - Planning to get rid of Entresto if ECHO ok Plan A: increase  carvedilol  Plan B: increase entresto F/u with [facility] in 3 months + ECHO  Overall feeling ***. Dizziness, lightheadedness, fatigue:  Chest pain or palpitations:  How is your breathing?: *** SOB: Able to complete all ADLs. Activity level ***  Weight at home pounds. Takes furosemide/torsemide/bumex *** mg *** daily.  LEE PND/Orthopnea  Appetite *** Low-salt diet:   Physical Exam Cost/affordability of meds  HF Medications: Carvedilol 3.125 mg BID Entresto 24/26 mg BID Spironolactone 25 mg daily  Has the patient been experiencing any side effects to the medications prescribed?  {YES NO:22349}  Does the patient have any problems obtaining medications due to transportation or finances?   No - [employer] (uses [employer] copay card)  Understanding of regimen: {excellent/good/fair/poor:19665} Understanding of indications: {excellent/good/fair/poor:19665} Potential of compliance: {excellent/good/fair/poor:19665} Patient understands to avoid NSAIDs. Patient understands to avoid decongestants.    Pertinent Lab Values: . Serum creatinine ***, BUN ***, Potassium ***, Sodium ***, BNP ***, Magnesium 2.0 (03/05/20), Digoxin ***   Vital Signs: . Weight: *** (last clinic weight: ***) . Blood pressure: ***  . Heart rate: ***   Assessment: 1. VT: Had out of hospital cardiac arrest 10/21 due to recurrent torsades with multiple shocks, also triggered by RV pacing from temporary wire(removed). Coronaries normal. Amiodarone stopped due to prolonged QT interval. Suspect that prolonged QT was the sequelae of stress (Takotsubo-type) cardiomyopathy. EKG at last visit w/ resolution of QT prolongation, 370/450 ms. S/p St Jude ICD. On mexiletine for maintenance therapy. No ICD shocks at last check.  - Continue mexiletine 150 mg BID  - Continue carvedilol 3.125 mg BID   2. Takotsubo Cardiomyopathy: Echo 10/21 w/  EF <20% (no LV thrombus), initial cath with normal coronaries. Concern for stress  (  Takotsubo-type) cardiomyopathy. Recent emotional stress. Cardiac MRI 11/21 showed LV EF back up to normal range (EF 56%) with no delayed enhancement =>this is supportive of stress cardiomyopathy. NYHA Class II. Euvolemic on exam  - Continue carvedilol 3.125 mg BID - Continue Entresto 25-26 mg BID - Continue spironolactone 25 mg daily - Digoxin stopped with recovery of EF.  - Plans noted for follow-up ECHO next month - if EF remains normal, planning to drop Entresto but continue the carvedilol and spironolactone.   3. Psych/neuro: Significant anxiety at baseline.   Plan: 1) Medication changes: Based on clinical presentation, vital signs and recent labs will *** 2) Labs: *** 3) Follow-up visit and ECHO with Dr. Shirlee Latch on 06/05/20  Tama Headings, PharmD, BCPS PGY2 Cardiology Pharmacy Resident  Karle Plumber, PharmD, BCPS, [facility], CPP Heart Failure Clinic Pharmacist [phone]

## 2020-04-16 ENCOUNTER — Ambulatory Visit (HOSPITAL_COMMUNITY)
Admission: RE | Admit: 2020-04-16 | Discharge: 2020-04-16 | Disposition: A | Discharge status: Home / Self Care | Payer: BC Managed Care – PPO | Source: Ambulatory Visit | Attending: Cardiology | Admitting: Cardiology

## 2020-04-16 ENCOUNTER — Inpatient Hospital Stay (HOSPITAL_COMMUNITY): Admission: RE | Admit: 2020-04-16 | Payer: BC Managed Care – PPO | Source: Ambulatory Visit

## 2020-04-16 ENCOUNTER — Other Ambulatory Visit: Payer: Self-pay

## 2020-04-16 VITALS — BP 110/74 | HR 76 | Wt 167.4 lb

## 2020-04-16 DIAGNOSIS — I472 Ventricular tachycardia: Secondary | ICD-10-CM | POA: Insufficient documentation

## 2020-04-16 DIAGNOSIS — I5181 Takotsubo syndrome: Secondary | ICD-10-CM | POA: Insufficient documentation

## 2020-04-16 DIAGNOSIS — I5021 Acute systolic (congestive) heart failure: Secondary | ICD-10-CM | POA: Insufficient documentation

## 2020-04-16 DIAGNOSIS — F419 Anxiety disorder, unspecified: Secondary | ICD-10-CM | POA: Insufficient documentation

## 2020-04-16 LAB — BASIC METABOLIC PANEL
Anion gap: 11 (ref 5–15)
BUN: 13 mg/dL (ref 6–20)
CO2: 25 mmol/L (ref 22–32)
Calcium: 9.2 mg/dL (ref 8.9–10.3)
Chloride: 104 mmol/L (ref 98–111)
Creatinine, Ser: 0.89 mg/dL (ref 0.44–1.00)
GFR, Estimated: >60 mL/min (ref >60)
Glucose, Bld: 96 mg/dL (ref 70–99)
Potassium: 4 mmol/L (ref 3.5–5.1)
Sodium: 140 mmol/L (ref 135–145)

## 2020-04-16 MED ORDER — CARVEDILOL 6.25 MG PO TABS
6.2500 mg | ORAL_TABLET | Freq: Two times a day (BID) | ORAL | 11 refills | Status: DC
Start: 2020-04-16 — End: 2021-04-11

## 2020-04-16 MED ORDER — SPIRONOLACTONE 25 MG PO TABS
25.0000 mg | ORAL_TABLET | Freq: Every day | ORAL | 11 refills | Status: DC
Start: 2020-04-16 — End: 2021-02-25

## 2020-04-16 MED ORDER — MEXILETINE HCL 150 MG PO CAPS
150.0000 mg | ORAL_CAPSULE | Freq: Two times a day (BID) | ORAL | 11 refills | Status: DC
Start: 2020-04-16 — End: 2020-06-05

## 2020-04-16 NOTE — Patient Instructions (Addendum)
It was a pleasure seeing you today!  MEDICATIONS: -We are changing your medications today -Increase carvedilol to 6.25 mg (1 tablet) twice daily. You may take 2 tablets of the 3.125 mg strength twice daily until you pick up the new strength.  -Call if you have questions about your medications.   NEXT APPOINTMENT: Return to clinic in 1 month with Dr. Shirlee Latch.  In general, to take care of your heart failure: -Limit your fluid intake to 2 Liters (half-gallon) per day.   -Limit your salt intake to ideally 2-3 grams (2000-3000 mg) per day. -Weigh yourself daily and record, and bring that "weight diary" to your next appointment.  (Weight gain of 2-3 pounds in 1 day typically means fluid weight.) -The medications for your heart are to help your heart and help you live longer.   -Please contact us before stopping any of your heart medications.  Call the clinic at [phone] with questions or to reschedule future appointments.

## 2020-04-16 NOTE — Progress Notes (Signed)
Referring Physician: PCP: Patient, No Pcp Per PCP-Cardiologist: Dr. Shirlee Latch  EP: Dr. Ladona Ridgel   HPI:  Ms Jacquet is a 55 year old with h/o depression,recent witnessed out of hospital cardiac arrest 10/21(received 7 minutes of CPR)at homewith refractory vfib/ torsades.  Over the past few months, she has been under a lot of stress. Witnessed cardiac arrest at home(CPR 7 min)with refractory vfib/ torsades. Had cardioversion at home followed by transport to [facility] and intubated in ER.Drug screen was positive for benzodiazepines and amphetamines.HS Trop [id number]. Lactic Acid 2.4>1.3>1.9. Cath showednormal coronarieswith pacing wire left in place. LV gram EF 20% concerning for Takotsubo. Required multiple cardioversions post-cath for recurrent torsades. Was on amiodarone infusion initially but this was stopped due to prolongedQTc. Lidocaine and norepinephrine infusion was started. Had recurrent torsades again and received another shock. EP/CT consulted.ICD placed. Placed on GDMT for systolic heart failure. Mexiletine added for antiarrhymic therapy. Had cMRI prior to d/c which showed interval improvement in LVEF up to 56% suggestive of recovery of Takotsubo cardiomyopathy.  She recently presented to clinic on 03/05/20 for post-hospital follow-up with Robbie Lis, PA-C. She was very anxious. Denied exertional or resting dyspnea. Wt stable at home. SBP 130s. Was tolerating meds fine w/o side effects. She was unable to get Entresto from pharmacy post d/c due to issues w/ insurance coverage (needs PA).  QT/QTc stable on EKG, 370/450 ms. No ICD shocks, device pocket stable.   Today she returns to HF clinic for pharmacist medication titration. At last visit in [facility], no medication changes were made. Entresto PA was approved and patient was given $10 copay card. Today, she reports that she stopped her Entresto after 1.5 weeks due to severe depression - this resolved 2 days after stopping  medication. She is feeling good overall. Denies dizziness, lightheadedness, fatigue. No chest pain or palpitations. Breathing is very good and she is able to go up 4 flights of stairs without SOB. Weight is up 6 lbs since last visit but has been stable at home ~164 lbs. She attributes weight gain to poor diet. No LEE, PND/orthopnea. Trying to stay hydrated and follows low-salt diet.  HF Medications: Carvedilol 3.125 mg BID Spironolactone 25 mg daily  Has the patient been experiencing any side effects to the medications prescribed?  no  Does the patient have any problems obtaining medications due to transportation or finances?   No - [employer]  Understanding of regimen: fair - husband (former paramedic) helps with medications. Understanding of indications: good Potential of compliance: good Patient understands to avoid NSAIDs. Patient understands to avoid decongestants.    Pertinent Lab Values: . Serum creatinine 0.89, BUN 13, Potassium 4.0, Sodium 140  Vital Signs: . Weight: 167.4 lbs (last clinic weight: 161 lbs) . Blood pressure: 110/74   . Heart rate: 76   Assessment: 1. VT: Had out of hospital cardiac arrest 10/21 due to recurrent torsades with multiple shocks, also triggered by RV pacing from temporary wire(removed). Coronaries normal. Amiodarone stopped due to prolonged QT interval. Suspect that prolonged QT was the sequelae of stress (Takotsubo-type) cardiomyopathy. EKG at last visit w/ resolution of QT prolongation, 370/450 ms. S/p St Jude ICD. On mexiletine for maintenance therapy. No ICD shocks at last check.  - Continue mexiletine 150 mg BID  - Increase carvedilol to 6.25 mg BID   2. Takotsubo Cardiomyopathy: Echo 10/21 w/  EF <20% (no LV thrombus), initial cath with normal coronaries. Concern for stress (Takotsubo-type) cardiomyopathy. Recent emotional stress. Cardiac MRI 11/21  showed LV EF back up to normal range (EF 56%) with no delayed enhancement  =>this is supportive of stress cardiomyopathy. NYHA Class II. Euvolemic on exam  - Increase carvedilol to 6.25 mg BID - She self-discontinued Entresto due to severe depression while taking Entresto - Continue spironolactone 25 mg daily - Digoxin stopped with recovery of EF.  - Plans noted for follow-up ECHO next month   3. Psych/neuro: Significant anxiety at baseline.   Plan: 1) Medication changes: Based on clinical presentation, vital signs and recent labs will increase carvedilol to 6.25 mg BID  2) Follow-up visit and ECHO with Dr. Shirlee Latch on 06/05/20  Tama Headings, PharmD, BCPS PGY2 Cardiology Pharmacy Resident  Karle Plumber, PharmD, BCPS, [facility], CPP Heart Failure Clinic Pharmacist [phone]

## 2020-05-15 ENCOUNTER — Telehealth: Payer: Self-pay | Admitting: Emergency Medicine

## 2020-05-15 ENCOUNTER — Ambulatory Visit (INDEPENDENT_AMBULATORY_CARE_PROVIDER_SITE_OTHER): Payer: BC Managed Care – PPO

## 2020-05-15 DIAGNOSIS — I472 Ventricular tachycardia, unspecified: Secondary | ICD-10-CM

## 2020-05-15 LAB — CUP PACEART REMOTE DEVICE CHECK
Battery Remaining Longevity: 101 mo
Battery Remaining Percentage: 95 %
Battery Voltage: 3.07 V
Brady Statistic RV Percent Paced: 1 %
Date Time Interrogation Session: 20220201010100
HighPow Impedance: 72 Ohm
Implantable Lead Implant Date: 20211102
Implantable Lead Location: 753860
Implantable Pulse Generator Implant Date: 20211102
Lead Channel Impedance Value: 550 Ohm
Lead Channel Pacing Threshold Amplitude: 0.75 V
Lead Channel Pacing Threshold Pulse Width: 0.5 ms
Lead Channel Sensing Intrinsic Amplitude: 12 mV
Lead Channel Setting Pacing Amplitude: 3.5 V
Lead Channel Setting Pacing Pulse Width: 0.5 ms
Lead Channel Setting Sensing Sensitivity: 0.5 mV
Pulse Gen Serial Number: 111028702

## 2020-05-15 NOTE — Telephone Encounter (Signed)
LMOM to call DC, # provided.  Corvue elevated. Has f/u with Dr. Lalla Brothers 05/22/20.

## 2020-05-18 NOTE — Telephone Encounter (Signed)
Attempted to call patient to assess. No answer, LMOVM.

## 2020-05-22 ENCOUNTER — Encounter: Payer: BC Managed Care – PPO | Admitting: Cardiology

## 2020-05-23 NOTE — Telephone Encounter (Signed)
LMOM to call device clinic , DC # provided.

## 2020-05-24 NOTE — Progress Notes (Signed)
Remote ICD transmission.   

## 2020-06-01 NOTE — Telephone Encounter (Signed)
Patient reports over the past few weeks she was not eating well or moving around much. Reported of lower leg edema a week or so ago, since has improved. States she is active again and walking.  Reports she missed several days of spironolactone 25 mg daily, has taken it the past 3 days.  She missed her last OV w/ Dr. Lalla Brothers. Will send to scheduler to reschedule.

## 2020-06-05 ENCOUNTER — Encounter (HOSPITAL_COMMUNITY): Payer: Self-pay | Admitting: Cardiology

## 2020-06-05 ENCOUNTER — Other Ambulatory Visit: Payer: Self-pay

## 2020-06-05 ENCOUNTER — Ambulatory Visit (HOSPITAL_BASED_OUTPATIENT_CLINIC_OR_DEPARTMENT_OTHER)
Admission: RE | Admit: 2020-06-05 | Discharge: 2020-06-05 | Disposition: A | Discharge status: Home / Self Care | Payer: BC Managed Care – PPO | Source: Ambulatory Visit | Attending: Cardiology | Admitting: Cardiology

## 2020-06-05 ENCOUNTER — Ambulatory Visit (HOSPITAL_COMMUNITY)
Admission: RE | Admit: 2020-06-05 | Discharge: 2020-06-05 | Disposition: A | Discharge status: Home / Self Care | Payer: BC Managed Care – PPO | Source: Ambulatory Visit | Attending: Cardiology | Admitting: Cardiology

## 2020-06-05 VITALS — BP 150/80 | HR 65 | Wt 168.6 lb

## 2020-06-05 DIAGNOSIS — F419 Anxiety disorder, unspecified: Secondary | ICD-10-CM | POA: Diagnosis not present

## 2020-06-05 DIAGNOSIS — Z79899 Other long term (current) drug therapy: Secondary | ICD-10-CM | POA: Diagnosis not present

## 2020-06-05 DIAGNOSIS — Z8674 Personal history of sudden cardiac arrest: Secondary | ICD-10-CM | POA: Diagnosis not present

## 2020-06-05 DIAGNOSIS — I5181 Takotsubo syndrome: Secondary | ICD-10-CM | POA: Insufficient documentation

## 2020-06-05 DIAGNOSIS — Z95810 Presence of automatic (implantable) cardiac defibrillator: Secondary | ICD-10-CM | POA: Insufficient documentation

## 2020-06-05 DIAGNOSIS — I5022 Chronic systolic (congestive) heart failure: Secondary | ICD-10-CM

## 2020-06-05 HISTORY — DX: Heart failure, unspecified: I50.9

## 2020-06-05 HISTORY — DX: Scoliosis, unspecified: M41.9

## 2020-06-05 LAB — ECHOCARDIOGRAM COMPLETE
Area-P 1/2: 2.73 cm2
S' Lateral: 2.55 cm

## 2020-06-05 LAB — BASIC METABOLIC PANEL
Anion gap: 8 (ref 5–15)
BUN: 13 mg/dL (ref 6–20)
CO2: 27 mmol/L (ref 22–32)
Calcium: 9.6 mg/dL (ref 8.9–10.3)
Chloride: 103 mmol/L (ref 98–111)
Creatinine, Ser: 0.9 mg/dL (ref 0.44–1.00)
GFR, Estimated: >60 mL/min (ref >60)
Glucose, Bld: 94 mg/dL (ref 70–99)
Potassium: 4.4 mmol/L (ref 3.5–5.1)
Sodium: 138 mmol/L (ref 135–145)

## 2020-06-05 MED ORDER — LOSARTAN POTASSIUM 25 MG PO TABS: 25.0000 mg | ORAL_TABLET | Freq: Every day | ORAL | 3 refills | Status: DC

## 2020-06-05 NOTE — Patient Instructions (Addendum)
EKG done today.  Labs done today. We will contact you only if your labs are abnormal.  STOP taing Mexiletine  START taking Losartan 25mg  (1 tablet) by mouth daily.  No other medication changes were made. Please continue all current medications as prescribed.  Your physician recommends that you schedule a follow-up appointment in:  10 days for a lab only appointment,3 momths for a lab only appointment and in 6 months for an appointment with Dr. . Please contact our office in July to schedule an August appointment.    If you have any questions or concerns before your next appointment please send [place] a message through [place] or call our office at [phone].    TO LEAVE A MESSAGE FOR THE NURSE SELECT OPTION 2, PLEASE LEAVE A MESSAGE INCLUDING: . YOUR NAME . DATE OF BIRTH . CALL BACK NUMBER . REASON FOR CALL**this is important as we prioritize the call backs  YOU WILL RECEIVE A CALL BACK THE SAME DAY AS LONG AS YOU CALL BEFORE 4:00 PM   Do the following things EVERYDAY: 1) Weigh yourself in the morning before breakfast. Write it down and keep it in a log. 2) Take your medicines as prescribed 3) Eat low salt foods-Limit salt (sodium) to 2000 mg per day.  4) Stay as active as you can everyday 5) Limit all fluids for the day to less than 2 liters   At the [facility], you and your health needs are our priority. As part of our continuing mission to provide you with exceptional heart care, we have created designated Provider Care Teams. These Care Teams include your primary Cardiologist (physician) and Advanced Practice Providers (APPs- Physician Assistants and Nurse Practitioners) who all work together to provide you with the care you need, when you need it.   You may see any of the following providers on your designated Care Team at your next follow up: [phone] Dr Marland Kitchen . Dr Arvilla Meres . Marca Ancona, NP . Tonye Becket, PA . Robbie Lis,  PharmD   Please be sure to bring in all your medications bottles to every appointment.

## 2020-06-05 NOTE — Progress Notes (Signed)
Echocardiogram 2D Echocardiogram has been performed.  Warren Lacy Anessa Charley 06/05/2020, 1:47 PM

## 2020-06-06 NOTE — Progress Notes (Signed)
[facility] Note   PCP: Patient, No Pcp Per Cardiology: Dr. Shirlee Latch    HPI:  Kristina Nichols is a 55 y.o. with h/o depression, witnessed out of hospital cardiac arrest 10/21 (received 7 minutes of CPR) at [facility] refractory vib/ torsades.  Prior to her 10/21 admission, she had been under a lot of stress. Witnessed cardiac arrest at home(CPR 7 min) with refractory vib/ torsades. Had cardioversion at home followed by transport to [facility]. Intubated in ER.  Cardiac cath showed normal coronaries. LV gram EF 20% concerning for Takotsubo cardiomyopathy. Required multiple cardioversions post cath for recurrent torsades. On amio drip initially but had prolonged QTc so amiodarone was stopped. Lidocaine then started. Had recurrent torsades again and received another shock. EP consulted.  St Jude ICD placed. Placed on GDMT for systolic heart failure. Mexiletine added for antiarrhymic therapy. Had cMRI prior to d/c which showed interval improvement in LVEF up to 56% suggestive of recovery of Takotsubo cardiomyopathy.  Echo was done today and reviewed, EF 50-55%, normal RV.   She returns for followup of Takotsubo cardiomyopathy.  She remains anxious.  She stopped Entresto as she says that it made her depressed (depression started when [facility] begun and resolved when she stopped it).  Overall, she is doing well symptomatically.  No exertional dyspnea.  No chest pain.  Walks for exercise.  No orthopnea/PND.  No palpitations or lightheadedness.  Stable weight. BP is high in the office today but she is nervous.  She takes her BP regularly at home, SBP runs 120s-130s.   ECG (personally reviewed): NSR, normal  Labs (1/22): K 4, creatinine 0.89  PMH: 1. Anxiety.  2. Scoliosis 3. GERD 4. Takotsubo cardiomyopathy: Occurred in setting of severe emotional stress in 10/21.  - Cath in 10/21 with normal coronaries, EF 20% on LV-gram.  - Echo (10/21) with EF < 20%, global HK, normal RV.  - Cardiac  MRI (11/21): EF 56%, RVEF 47%, no LGE.  - Echo (2/22): EF 50-55%, normal diastolic function, normal RV.  5. VT: Multiple episodes of torsades de pointes in setting of Takotsubo cardiomyopathy.  - St Jude ICD placed.   Review of systems complete and found to be negative unless listed in HPI.     Current Outpatient Medications  Medication Sig Dispense Refill  . acetaminophen (TYLENOL) 500 MG tablet Take 500-1,000 mg by mouth every 6 (six) hours as needed for mild pain or headache.    Marland Kitchen buPROPion (WELLBUTRIN XL) 150 MG 24 hr tablet Take 150 mg by mouth at bedtime.    Marland Kitchen buPROPion (WELLBUTRIN XL) 300 MG 24 hr tablet Take 300 mg by mouth in the morning.    . carvedilol (COREG) 6.25 MG tablet Take 1 tablet (6.25 mg total) by mouth 2 (two) times daily with a meal. 60 tablet 11  . clonazePAM (KLONOPIN) 1 MG tablet Take 1 mg by mouth 3 (three) times daily as needed for anxiety.    . lamoTRIgine (LAMICTAL) 150 MG tablet Take 150 mg by mouth 2 (two) times daily.    Marland Kitchen losartan (COZAAR) 25 MG tablet Take 1 tablet (25 mg total) by mouth daily. 90 tablet 3  . omeprazole (PRILOSEC OTC) 20 MG tablet Take 20 mg by mouth daily as needed (for heartburn or indigestion).    Marland Kitchen spironolactone (ALDACTONE) 25 MG tablet Take 1 tablet (25 mg total) by mouth daily. 30 tablet 11   No current facility-administered medications for this encounter.    No Known Allergies  Social History   Socioeconomic History  . Marital status: Married    Spouse name: Kristina Nichols  . Number of children: Not on file  . Years of education: Not on file  . Highest education level: Not on file  Occupational History  . Not on file  Tobacco Use  . Smoking status: Never Smoker  . Smokeless tobacco: Never Used  Vaping Use  . Vaping Use: Never used  Substance and Sexual Activity  . Alcohol use: Not Currently  . Drug use: Never  . Sexual activity: Not on file  Other Topics Concern  . Not on file  Social History Narrative  . Not on  file   Social Determinants of Health   Financial Resource Strain: Not on file  Food Insecurity: Not on file  Transportation Needs: Not on file  Physical Activity: Not on file  Stress: Not on file  Social Connections: Not on file  Intimate Partner Violence: Not on file      Family History  Problem Relation Age of Onset  . Alzheimer's disease Father   . Chronic granulomatous disease Son     Vitals:   06/05/20 1402  BP: (!) 150/80  Pulse: 65  SpO2: 99%  Weight: 76.5 kg (168 lb 9.6 oz)   PHYSICAL EXAM: General: NAD Neck: No JVD, no thyromegaly or thyroid nodule.  Lungs: Clear to auscultation bilaterally with normal respiratory effort. CV: Nondisplaced PMI.  Heart regular S1/S2, no S3/S4, no murmur.  No peripheral edema.  No carotid bruit.  Normal pedal pulses.  Abdomen: Soft, nontender, no hepatosplenomegaly, no distention.  Skin: Intact without lesions or rashes.  Neurologic: Alert and oriented x 3.  Psych: Normal affect. Extremities: No clubbing or cyanosis.  HEENT: Normal.   ASSESSMENT & PLAN:  1. VT: Had out of hospital cardiac arrest 10/21 due to recurrent torsades with multiple shocks.  Coronaries normal in 10/21, EF <20% on echo. Amiodarone stopped due to prolonged QT interval. Suspect that prolonged QT was the sequelae of stress (Takotsubo-type) cardiomyopathy. S/p St Jude ICD. On mexiletine for maintenance therapy.  Cardiac MRI in 11/21 showed no delayed enhancement.  Echo done today and reviewed, shows EF improved to 50-55%.     - I think she can stop mexiletine with ICD present and recovery of LV EF.  - Continue Coreg 6.25 mg bid  - No driving for 6 months, 7/20.  2. Takotsubo cardiomyopathy: Echo 10/21 w/  EF <20% (no LV thrombus), cath in 10/21 with normal coronaries. Suspect stress (Takotsubo-type) cardiomyopathy. Recent emotional stress. Cardiac MRI 11/21 showed LV EF back up to normal range (EF 56%) with no delayed enhancement => this is supportive of  stress cardiomyopathy.  Echo today showed EF 50-55%.  NYHA Class II. Euvolemic on exam.   - She did not tolerate Entresto as it caused her to feel depressed.  I will have her start losartan 25 mg daily instead. BMET today and in 10 days.  - Continue current doses of spironolactone and Coreg 3. Psych/neuro: Significant anxiety at baseline.   BMET in 3 months, followup in 6 months.   Marca Ancona, MD 06/06/20

## 2020-06-15 ENCOUNTER — Other Ambulatory Visit: Payer: Self-pay

## 2020-06-15 ENCOUNTER — Ambulatory Visit (HOSPITAL_COMMUNITY)
Admission: RE | Admit: 2020-06-15 | Discharge: 2020-06-15 | Disposition: A | Discharge status: Home / Self Care | Payer: 59 | Source: Ambulatory Visit | Attending: Cardiology | Admitting: Cardiology

## 2020-06-15 DIAGNOSIS — I5022 Chronic systolic (congestive) heart failure: Secondary | ICD-10-CM | POA: Diagnosis not present

## 2020-06-15 LAB — BASIC METABOLIC PANEL
Anion gap: 7 (ref 5–15)
BUN: 15 mg/dL (ref 6–20)
CO2: 27 mmol/L (ref 22–32)
Calcium: 9.2 mg/dL (ref 8.9–10.3)
Chloride: 102 mmol/L (ref 98–111)
Creatinine, Ser: 0.86 mg/dL (ref 0.44–1.00)
GFR, Estimated: >60 mL/min (ref >60)
Glucose, Bld: 91 mg/dL (ref 70–99)
Potassium: 4.9 mmol/L (ref 3.5–5.1)
Sodium: 136 mmol/L (ref 135–145)

## 2020-06-19 ENCOUNTER — Encounter: Payer: 59 | Admitting: Cardiology

## 2020-08-14 ENCOUNTER — Ambulatory Visit (INDEPENDENT_AMBULATORY_CARE_PROVIDER_SITE_OTHER): Payer: 59

## 2020-08-14 DIAGNOSIS — I469 Cardiac arrest, cause unspecified: Secondary | ICD-10-CM | POA: Diagnosis not present

## 2020-08-14 LAB — CUP PACEART REMOTE DEVICE CHECK
Battery Remaining Longevity: 109 mo
Battery Remaining Percentage: 93 %
Battery Voltage: 3.04 V
Brady Statistic RV Percent Paced: 1 %
Date Time Interrogation Session: 20220503030059
HighPow Impedance: 77 Ohm
Implantable Lead Implant Date: 20211102
Implantable Lead Location: 753860
Implantable Pulse Generator Implant Date: 20211102
Lead Channel Impedance Value: 380 Ohm
Lead Channel Pacing Threshold Amplitude: 0.75 V
Lead Channel Pacing Threshold Pulse Width: 0.5 ms
Lead Channel Sensing Intrinsic Amplitude: 12 mV
Lead Channel Setting Pacing Amplitude: 3.5 V
Lead Channel Setting Pacing Pulse Width: 0.5 ms
Lead Channel Setting Sensing Sensitivity: 0.5 mV
Pulse Gen Serial Number: 111028702

## 2020-08-16 DIAGNOSIS — M4316 Spondylolisthesis, lumbar region: Secondary | ICD-10-CM | POA: Diagnosis not present

## 2020-08-16 DIAGNOSIS — M47816 Spondylosis without myelopathy or radiculopathy, lumbar region: Secondary | ICD-10-CM | POA: Diagnosis not present

## 2020-08-16 DIAGNOSIS — M419 Scoliosis, unspecified: Secondary | ICD-10-CM | POA: Diagnosis not present

## 2020-08-16 DIAGNOSIS — M5416 Radiculopathy, lumbar region: Secondary | ICD-10-CM | POA: Diagnosis not present

## 2020-09-04 ENCOUNTER — Other Ambulatory Visit (HOSPITAL_COMMUNITY): Payer: BC Managed Care – PPO

## 2020-09-05 NOTE — Progress Notes (Signed)
Remote ICD transmission.   

## 2020-11-01 ENCOUNTER — Other Ambulatory Visit (HOSPITAL_COMMUNITY): Payer: 59

## 2020-11-13 ENCOUNTER — Ambulatory Visit (INDEPENDENT_AMBULATORY_CARE_PROVIDER_SITE_OTHER): Payer: 59

## 2020-11-13 ENCOUNTER — Other Ambulatory Visit: Payer: Self-pay

## 2020-11-13 ENCOUNTER — Ambulatory Visit (HOSPITAL_COMMUNITY)
Admission: RE | Admit: 2020-11-13 | Discharge: 2020-11-13 | Disposition: A | Discharge status: Home / Self Care | Payer: 59 | Source: Ambulatory Visit | Attending: Internal Medicine | Admitting: Internal Medicine

## 2020-11-13 DIAGNOSIS — I5022 Chronic systolic (congestive) heart failure: Secondary | ICD-10-CM | POA: Diagnosis not present

## 2020-11-13 DIAGNOSIS — I469 Cardiac arrest, cause unspecified: Secondary | ICD-10-CM | POA: Diagnosis not present

## 2020-11-13 LAB — BASIC METABOLIC PANEL
Anion gap: 8 (ref 5–15)
BUN: 17 mg/dL (ref 6–20)
CO2: 23 mmol/L (ref 22–32)
Calcium: 9.5 mg/dL (ref 8.9–10.3)
Chloride: 105 mmol/L (ref 98–111)
Creatinine, Ser: 0.95 mg/dL (ref 0.44–1.00)
GFR, Estimated: >60 mL/min (ref >60)
Glucose, Bld: 87 mg/dL (ref 70–99)
Potassium: 4.1 mmol/L (ref 3.5–5.1)
Sodium: 136 mmol/L (ref 135–145)

## 2020-11-13 LAB — CUP PACEART REMOTE DEVICE CHECK
Battery Remaining Longevity: 108 mo
Battery Remaining Percentage: 91 %
Battery Voltage: 3.02 V
Brady Statistic RV Percent Paced: 1 %
Date Time Interrogation Session: 20220802020042
HighPow Impedance: 91 Ohm
Implantable Lead Implant Date: 20211102
Implantable Lead Location: 753860
Implantable Pulse Generator Implant Date: 20211102
Lead Channel Impedance Value: 460 Ohm
Lead Channel Pacing Threshold Amplitude: 0.75 V
Lead Channel Pacing Threshold Pulse Width: 0.5 ms
Lead Channel Sensing Intrinsic Amplitude: 12 mV
Lead Channel Setting Pacing Amplitude: 3.5 V
Lead Channel Setting Pacing Pulse Width: 0.5 ms
Lead Channel Setting Sensing Sensitivity: 0.5 mV
Pulse Gen Serial Number: 111028702

## 2020-11-15 DIAGNOSIS — M419 Scoliosis, unspecified: Secondary | ICD-10-CM | POA: Diagnosis not present

## 2020-11-15 DIAGNOSIS — M5416 Radiculopathy, lumbar region: Secondary | ICD-10-CM | POA: Diagnosis not present

## 2020-11-15 DIAGNOSIS — Z6833 Body mass index (BMI) 33.0-33.9, adult: Secondary | ICD-10-CM | POA: Diagnosis not present

## 2020-11-15 DIAGNOSIS — M4316 Spondylolisthesis, lumbar region: Secondary | ICD-10-CM | POA: Diagnosis not present

## 2020-11-26 ENCOUNTER — Encounter (HOSPITAL_COMMUNITY): Payer: 59

## 2020-12-08 NOTE — Progress Notes (Signed)
Remote ICD transmission.   

## 2020-12-25 ENCOUNTER — Other Ambulatory Visit (HOSPITAL_COMMUNITY): Payer: Self-pay | Admitting: Orthopaedic Surgery

## 2020-12-25 DIAGNOSIS — M4316 Spondylolisthesis, lumbar region: Secondary | ICD-10-CM

## 2020-12-25 DIAGNOSIS — M5416 Radiculopathy, lumbar region: Secondary | ICD-10-CM

## 2021-01-01 ENCOUNTER — Encounter (HOSPITAL_COMMUNITY): Payer: Self-pay | Admitting: Cardiology

## 2021-01-01 ENCOUNTER — Other Ambulatory Visit: Payer: Self-pay

## 2021-01-01 ENCOUNTER — Other Ambulatory Visit (HOSPITAL_COMMUNITY): Payer: Self-pay

## 2021-01-01 ENCOUNTER — Ambulatory Visit (HOSPITAL_COMMUNITY)
Admission: RE | Admit: 2021-01-01 | Discharge: 2021-01-01 | Disposition: A | Discharge status: Home / Self Care | Payer: 59 | Source: Ambulatory Visit | Attending: Cardiology | Admitting: Cardiology

## 2021-01-01 VITALS — BP 104/70 | HR 62 | Wt 199.8 lb

## 2021-01-01 DIAGNOSIS — Z6833 Body mass index (BMI) 33.0-33.9, adult: Secondary | ICD-10-CM | POA: Diagnosis not present

## 2021-01-01 DIAGNOSIS — Z09 Encounter for follow-up examination after completed treatment for conditions other than malignant neoplasm: Secondary | ICD-10-CM | POA: Insufficient documentation

## 2021-01-01 DIAGNOSIS — M545 Low back pain, unspecified: Secondary | ICD-10-CM | POA: Insufficient documentation

## 2021-01-01 DIAGNOSIS — M419 Scoliosis, unspecified: Secondary | ICD-10-CM | POA: Diagnosis not present

## 2021-01-01 DIAGNOSIS — I5022 Chronic systolic (congestive) heart failure: Secondary | ICD-10-CM | POA: Diagnosis not present

## 2021-01-01 DIAGNOSIS — R69 Illness, unspecified: Secondary | ICD-10-CM | POA: Diagnosis not present

## 2021-01-01 DIAGNOSIS — Z7901 Long term (current) use of anticoagulants: Secondary | ICD-10-CM | POA: Diagnosis not present

## 2021-01-01 DIAGNOSIS — I5181 Takotsubo syndrome: Secondary | ICD-10-CM | POA: Insufficient documentation

## 2021-01-01 DIAGNOSIS — Z95810 Presence of automatic (implantable) cardiac defibrillator: Secondary | ICD-10-CM | POA: Diagnosis not present

## 2021-01-01 DIAGNOSIS — F419 Anxiety disorder, unspecified: Secondary | ICD-10-CM | POA: Diagnosis not present

## 2021-01-01 DIAGNOSIS — Z8674 Personal history of sudden cardiac arrest: Secondary | ICD-10-CM | POA: Diagnosis not present

## 2021-01-01 DIAGNOSIS — R9431 Abnormal electrocardiogram [ECG] [EKG]: Secondary | ICD-10-CM | POA: Diagnosis not present

## 2021-01-01 DIAGNOSIS — E669 Obesity, unspecified: Secondary | ICD-10-CM | POA: Diagnosis not present

## 2021-01-01 DIAGNOSIS — Z79899 Other long term (current) drug therapy: Secondary | ICD-10-CM | POA: Diagnosis not present

## 2021-01-01 MED ORDER — DAPAGLIFLOZIN PROPANEDIOL 10 MG PO TABS: 10.0000 mg | ORAL_TABLET | Freq: Every day | ORAL | 3 refills | Status: DC

## 2021-01-01 NOTE — Progress Notes (Signed)
[facility] Note   PCP: Patient, No Pcp Per (Inactive) Cardiology: Dr. Shirlee Latch    HPI:  Kristina Nichols is a 55 y.o. with h/o depression, witnessed out of hospital cardiac arrest 10/21 (received 7 minutes of CPR) at home with refractory vib/ torsades.   Prior to her 10/21 admission, she had been under a lot of stress. Witnessed cardiac arrest at home (CPR 7 min) with refractory vib/ torsades. Had cardioversion at home followed by transport to [facility]. Intubated in ER.  Cardiac cath showed normal coronaries. LV gram EF 20% concerning for Takotsubo cardiomyopathy. Required multiple cardioversions post cath for recurrent torsades. On amio drip initially but had prolonged QTc so amiodarone was stopped. Lidocaine then started. Had recurrent torsades again and received another shock. EP consulted.  St Jude ICD placed. Placed on GDMT for systolic heart failure. Mexiletine added for antiarrhymic therapy. Had cMRI prior to d/c which showed interval improvement in LVEF up to 56% suggestive of recovery of Takotsubo cardiomyopathy.  Echo in 2/22 showed EF 50-55%, normal RV.   She returns for followup of Takotsubo cardiomyopathy.  Weight is up 31 lbs.  She attributes this to poor diet.  Main complaint is low back pain from scoliosis.  She is walking for exercise.  No dyspnea walking on flat ground, up hills or up stairs.  No orthopnea/PND.  Anxiety is more controlled.  No orthopnea/PND. No chest pain.  No palpitations.   ECG (personally reviewed): NSR, 1st degree AVB, normal QT interval.   Labs (1/22): K 4, creatinine 0.89 Labs (8/22): K 4.1, creatinine 0.95  PMH: 1. Anxiety.  2. Scoliosis 3. GERD 4. Takotsubo cardiomyopathy: Occurred in setting of severe emotional stress in 10/21.  - Cath in 10/21 with normal coronaries, EF 20% on LV-gram.  - Echo (10/21) with EF < 20%, global HK, normal RV.  - Cardiac MRI (11/21): EF 56%, RVEF 47%, no LGE.  - Echo (2/22): EF 50-55%, normal diastolic  function, normal RV.  5. VT: Multiple episodes of torsades de pointes in setting of Takotsubo cardiomyopathy.  - St Jude ICD placed.   Review of systems complete and found to be negative unless listed in HPI.     Current Outpatient Medications  Medication Sig Dispense Refill   acetaminophen (TYLENOL) 500 MG tablet Take 1,000 mg by mouth in the morning, at noon, and at bedtime.     buPROPion (WELLBUTRIN XL) 150 MG 24 hr tablet Take 150 mg by mouth at bedtime.     buPROPion (WELLBUTRIN XL) 300 MG 24 hr tablet Take 300 mg by mouth in the morning.     carvedilol (COREG) 6.25 MG tablet Take 1 tablet (6.25 mg total) by mouth 2 (two) times daily with a meal. 60 tablet 11   clonazePAM (KLONOPIN) 1 MG tablet Take 1 mg by mouth 3 (three) times daily as needed for anxiety.     dapagliflozin propanediol (FARXIGA) 10 MG TABS tablet Take 1 tablet (10 mg total) by mouth daily before breakfast. 90 tablet 3   lamoTRIgine (LAMICTAL) 150 MG tablet Take 150 mg by mouth 2 (two) times daily.     losartan (COZAAR) 25 MG tablet Take 1 tablet (25 mg total) by mouth daily. 90 tablet 3   omeprazole (PRILOSEC OTC) 20 MG tablet Take 20 mg by mouth daily as needed (for heartburn or indigestion).     spironolactone (ALDACTONE) 25 MG tablet Take 1 tablet (25 mg total) by mouth daily. 30 tablet 11   No  current facility-administered medications for this encounter.    No Known Allergies    Social History   Socioeconomic History   Marital status: Married    Spouse name: Kristina Nichols   Number of children: Not on file   Years of education: Not on file   Highest education level: Not on file  Occupational History   Not on file  Tobacco Use   Smoking status: Never   Smokeless tobacco: Never  Vaping Use   Vaping Use: Never used  Substance and Sexual Activity   Alcohol use: Not Currently   Drug use: Never   Sexual activity: Not on file  Other Topics Concern   Not on file  Social History Narrative   Not on file    Social Determinants of Health   Financial Resource Strain: Not on file  Food Insecurity: Not on file  Transportation Needs: Not on file  Physical Activity: Not on file  Stress: Not on file  Social Connections: Not on file  Intimate Partner Violence: Not on file      Family History  Problem Relation Age of Onset   Alzheimer's disease Father    Chronic granulomatous disease Son     Vitals:   01/01/21 1111  BP: 104/70  Pulse: 62  SpO2: 98%  Weight: 90.6 kg (199 lb 12.8 oz)   PHYSICAL EXAM: General: NAD Neck: No JVD, no thyromegaly or thyroid nodule.  Lungs: Clear to auscultation bilaterally with normal respiratory effort. CV: Nondisplaced PMI.  Heart regular S1/S2, no S3/S4, 1/6 SEM RUSB.  No peripheral edema.  No carotid bruit.  Normal pedal pulses.  Abdomen: Soft, nontender, no hepatosplenomegaly, no distention.  Skin: Intact without lesions or rashes.  Neurologic: Alert and oriented x 3.  Psych: Normal affect. Extremities: No clubbing or cyanosis.  HEENT: Normal.   ASSESSMENT & PLAN:  1. VT: Had out of hospital cardiac arrest 10/21 due to recurrent torsades with multiple shocks.  Coronaries normal in 10/21, EF <20% on echo.  Amiodarone stopped due to prolonged QT interval. Suspect that prolonged QT was the sequelae of stress (Takotsubo-type) cardiomyopathy. S/p St Jude ICD.  Cardiac MRI in 11/21 showed no delayed enhancement.  Echo in 2/22 with EF improved to 50-55%.     - Continue Coreg 6.25 mg bid  2. Takotsubo cardiomyopathy: Echo 10/21 w/  EF < 20%  (no LV thrombus), cath in 10/21 with normal coronaries.  Suspect stress (Takotsubo-type) cardiomyopathy. Recent emotional stress. Cardiac MRI 11/21 showed LV EF back up to normal range (EF 56%) with no delayed enhancement => this is supportive of stress cardiomyopathy.  Echo in 2/22 showed EF 50-55%.  NYHA Class I-II. Euvolemic on exam.  I think that her weight gain is caloric rather than due to CHF.  - She did not  tolerate Entresto as it caused her to feel depressed.  Continue losartan 25 mg daily.  - Continue current doses of spironolactone and Coreg - Add Farxiga 10 mg daily or Jardiance 10 mg daily (based on insurance preference). BMET/BNP today and BMET in 10 days.  - Repeat echo in 2/23.  3. Psych/neuro: Significant anxiety at baseline.  4. Obesity: Weight gain appears caloric.  She has started exercising more.  - [facility] to [facility]. - We discussed limiting starches and soft drinks in her diet.   Followup in 2/23 with echo.    Kristina Ancona, MD 01/01/21

## 2021-01-01 NOTE — Patient Instructions (Signed)
EKG done today.  No Labs done today.   START Farxiga 10mg  (1 tablet) by mouth daily.   No medication changes were made. Please continue all current medications as prescribed.  Please follow up with your Electrophysiologist(EP MD) soon.   [facility] have been [facility] to [facility]. They will contact you to schedule an appointment.   Your physician recommends that you schedule a follow-up appointment in: 10 days for a lab only appointment and in 5 months with an echo prior to your exam. Please contact our office in January to schedule this appointment.   Your physician has requested that you have an echocardiogram. Echocardiography is a painless test that uses sound waves to create images of your heart. It provides your doctor with information about the size and shape of your heart and how well your heart's chambers and valves are working. This procedure takes approximately one hour. There are no restrictions for this procedure.  If you have any questions or concerns before your next appointment please send [place] a message through [place] or call our office at [phone].    TO LEAVE A MESSAGE FOR THE NURSE SELECT OPTION 2, PLEASE LEAVE A MESSAGE INCLUDING: YOUR NAME DATE OF BIRTH CALL BACK NUMBER REASON FOR CALL**this is important as we prioritize the call backs  YOU WILL RECEIVE A CALL BACK THE SAME DAY AS LONG AS YOU CALL BEFORE 4:00 PM   Do the following things EVERYDAY: Weigh yourself in the morning before breakfast. Write it down and keep it in a log. Take your medicines as prescribed Eat low salt foods--Limit salt (sodium) to 2000 mg per day.  Stay as active as you can everyday Limit all fluids for the day to less than 2 liters   At the [facility], you and your health needs are our priority. As part of our continuing mission to provide you with exceptional heart care, we have created designated Provider Care Teams. These Care Teams include  your primary Cardiologist (physician) and Advanced Practice Providers (APPs- Physician Assistants and Nurse Practitioners) who all work together to provide you with the care you need, when you need it.   You may see any of the following providers on your designated Care Team at your next follow up: Dr [phone] Dr Arvilla Meres, NP Carron Curie, Robbie Lis [place], PharmD   Please be sure to bring in all your medications bottles to every appointment.

## 2021-01-11 ENCOUNTER — Other Ambulatory Visit (HOSPITAL_COMMUNITY): Payer: 59

## 2021-02-01 ENCOUNTER — Other Ambulatory Visit: Payer: Self-pay

## 2021-02-01 ENCOUNTER — Ambulatory Visit (HOSPITAL_COMMUNITY)
Admission: RE | Admit: 2021-02-01 | Discharge: 2021-02-01 | Disposition: A | Discharge status: Home / Self Care | Payer: 59 | Source: Ambulatory Visit | Attending: Orthopaedic Surgery | Admitting: Orthopaedic Surgery

## 2021-02-01 DIAGNOSIS — M4316 Spondylolisthesis, lumbar region: Secondary | ICD-10-CM

## 2021-02-01 DIAGNOSIS — M5416 Radiculopathy, lumbar region: Secondary | ICD-10-CM

## 2021-02-12 ENCOUNTER — Ambulatory Visit (INDEPENDENT_AMBULATORY_CARE_PROVIDER_SITE_OTHER): Payer: 59

## 2021-02-12 DIAGNOSIS — I469 Cardiac arrest, cause unspecified: Secondary | ICD-10-CM

## 2021-02-12 LAB — CUP PACEART REMOTE DEVICE CHECK
Battery Remaining Longevity: 105 mo
Battery Remaining Percentage: 89 %
Battery Voltage: 3.02 V
Brady Statistic RV Percent Paced: 1 %
Date Time Interrogation Session: 20221101020205
HighPow Impedance: 71 Ohm
Implantable Lead Implant Date: 20211102
Implantable Lead Location: 753860
Implantable Pulse Generator Implant Date: 20211102
Lead Channel Impedance Value: 410 Ohm
Lead Channel Pacing Threshold Amplitude: 0.75 V
Lead Channel Pacing Threshold Pulse Width: 0.5 ms
Lead Channel Sensing Intrinsic Amplitude: 12 mV
Lead Channel Setting Pacing Amplitude: 3.5 V
Lead Channel Setting Pacing Pulse Width: 0.5 ms
Lead Channel Setting Sensing Sensitivity: 0.5 mV
Pulse Gen Serial Number: 111028702

## 2021-02-19 NOTE — Progress Notes (Signed)
Remote ICD transmission.   

## 2021-02-23 ENCOUNTER — Other Ambulatory Visit (HOSPITAL_COMMUNITY): Payer: Self-pay | Admitting: Cardiology

## 2021-03-01 ENCOUNTER — Telehealth (HOSPITAL_COMMUNITY): Payer: Self-pay | Admitting: Vascular Surgery

## 2021-03-11 ENCOUNTER — Other Ambulatory Visit: Payer: Self-pay

## 2021-03-11 ENCOUNTER — Ambulatory Visit (HOSPITAL_COMMUNITY)
Admission: RE | Admit: 2021-03-11 | Discharge: 2021-03-11 | Disposition: A | Discharge status: Home / Self Care | Payer: 59 | Source: Ambulatory Visit | Attending: Orthopaedic Surgery | Admitting: Orthopaedic Surgery

## 2021-03-11 DIAGNOSIS — M48061 Spinal stenosis, lumbar region without neurogenic claudication: Secondary | ICD-10-CM | POA: Diagnosis not present

## 2021-03-11 DIAGNOSIS — M4316 Spondylolisthesis, lumbar region: Secondary | ICD-10-CM | POA: Insufficient documentation

## 2021-03-11 DIAGNOSIS — M5416 Radiculopathy, lumbar region: Secondary | ICD-10-CM | POA: Insufficient documentation

## 2021-03-11 IMAGING — MR MR LUMBAR SPINE W/O CM
4 of 6 series · 18 of 48 positions shown · non-contrast
Comparison: None.

CLINICAL DATA: Radiculopathy, lumbar region. Spondylolisthesis,
lumbar region.

EXAM:
MRI LUMBAR SPINE WITHOUT CONTRAST
TECHNIQUE: Multiplanar, multisequence MR imaging of the lumbar spine was
performed. No intravenous contrast was administered.

[Series 3: T2 · sagittal · 4.0mm · 0.55mm/px · 6 of 18 slices shown (1 of 3)]
[im 1/18]
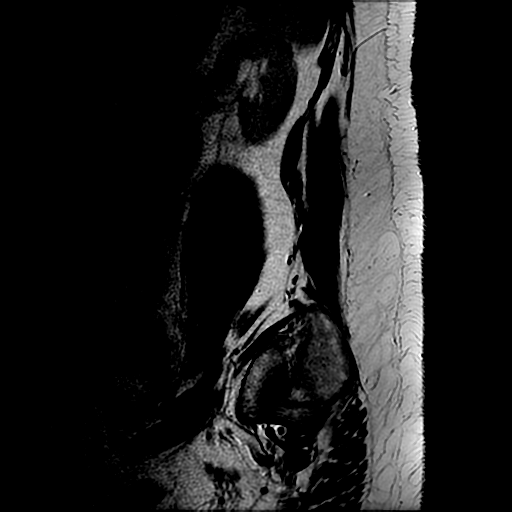
[im 4/18]
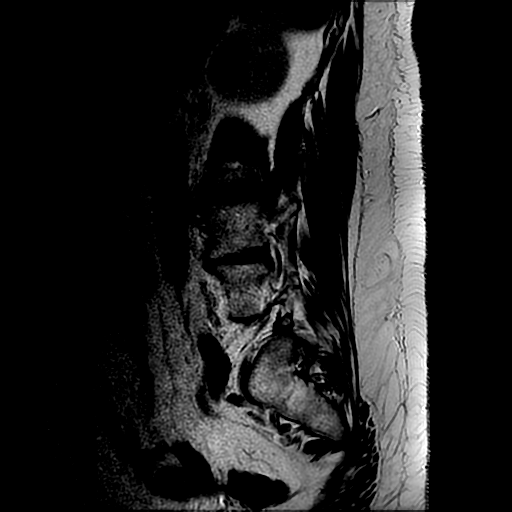
[im 7/18]
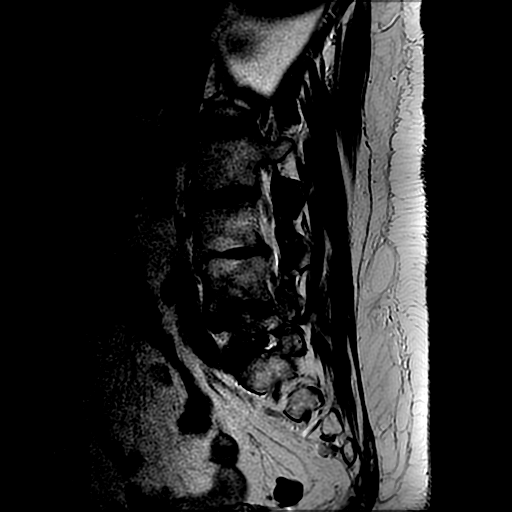
[im 11/18]
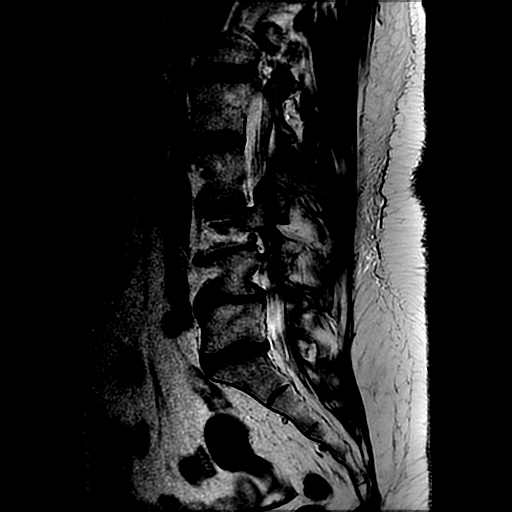
[im 14/18]
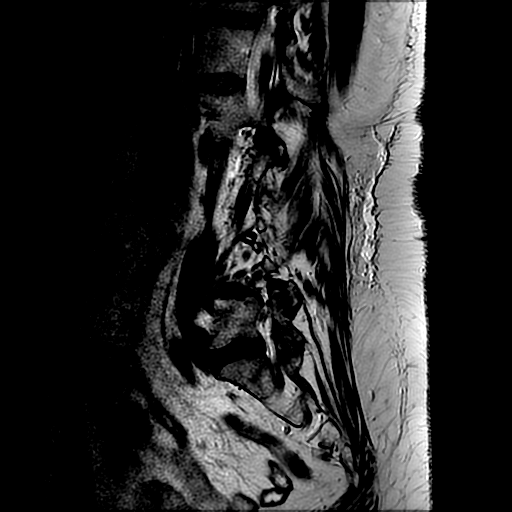
[im 18/18]
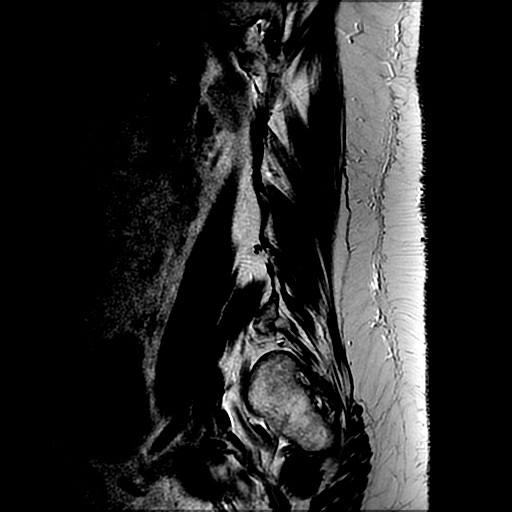

[Series 5: T2 · coronal · 4.0mm · 0.55mm/px · 6 of 17 slices shown (2 of 3)]
[im 1/17]
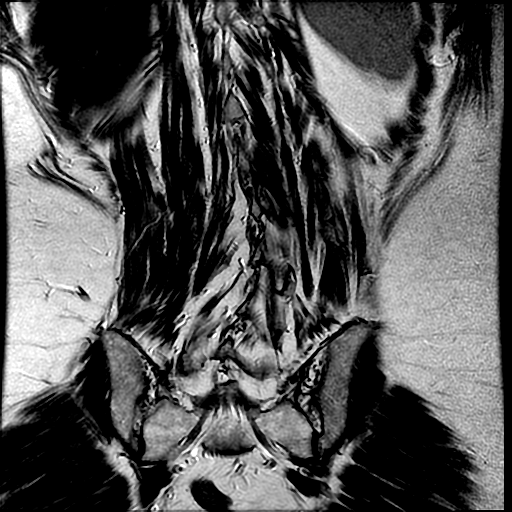
[im 4/17]
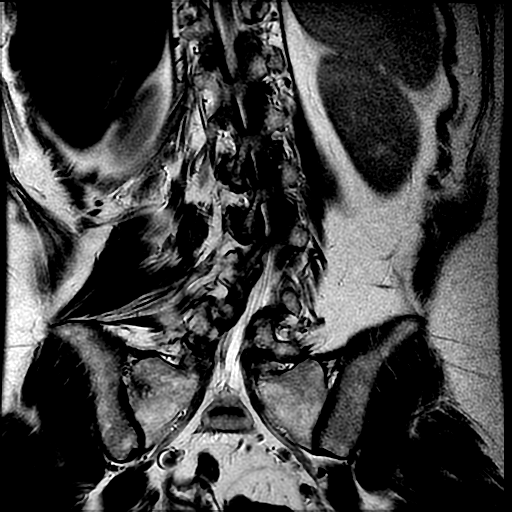
[im 7/17]
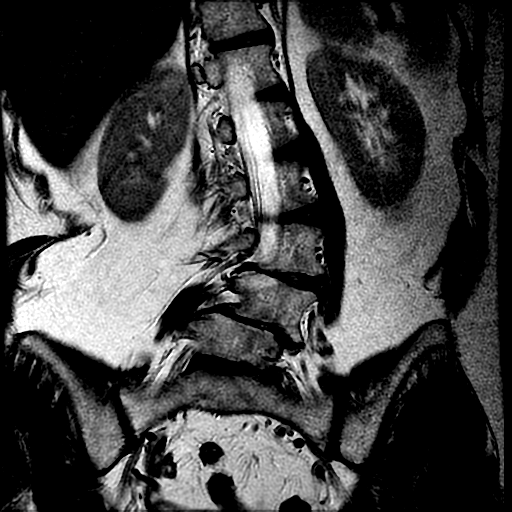
[im 10/17]
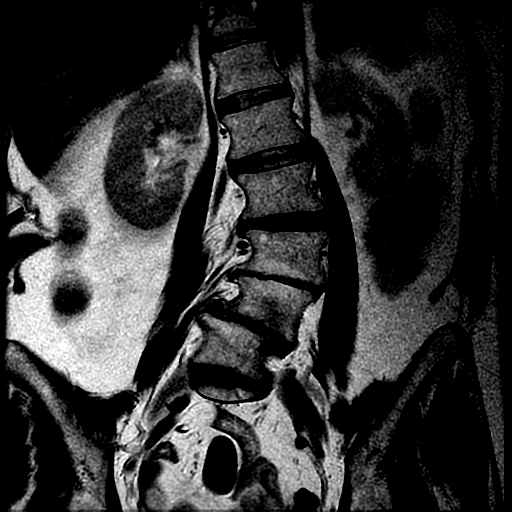
[im 13/17]
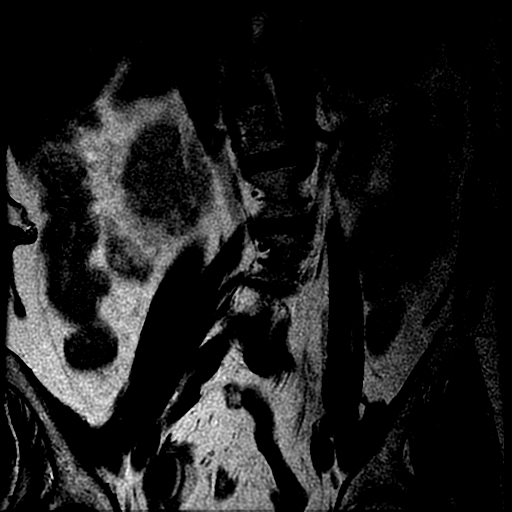
[im 17/17]
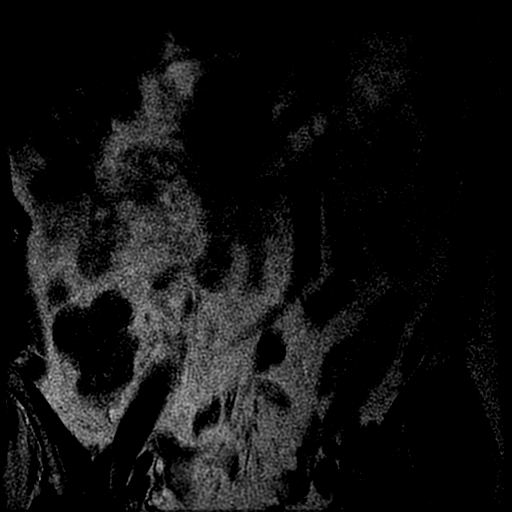

[Series 6: T1 · sagittal · 4.0mm · 0.55mm/px · 3 of 18 slices shown]
[im 4/18]
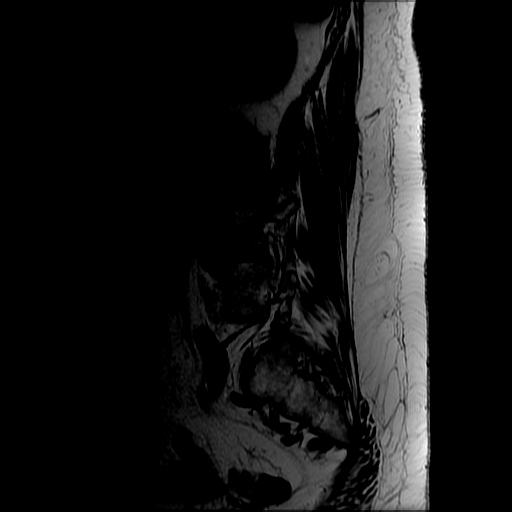
[im 11/18]
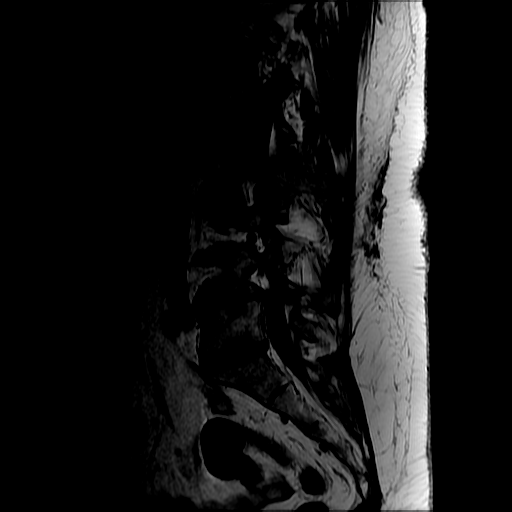
[im 18/18]
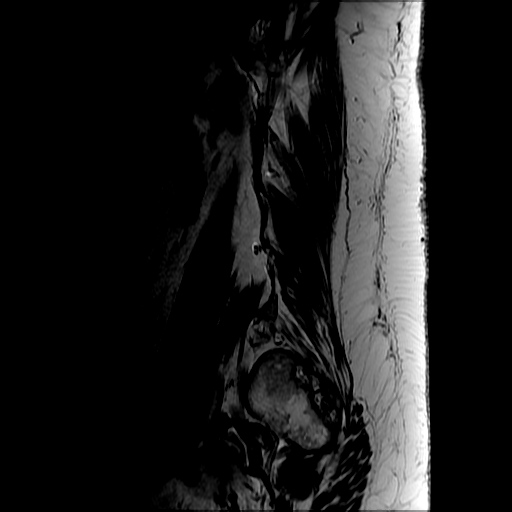

[Series 7: T2 · axial · 4.0mm · 0.39mm/px · z∈[-57,+52]mm · 3 of 36 slices shown (3 of 3)]
[im 7/36]
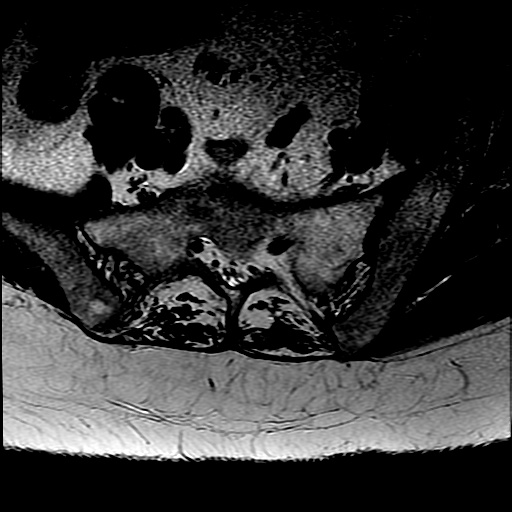
[im 20/36]
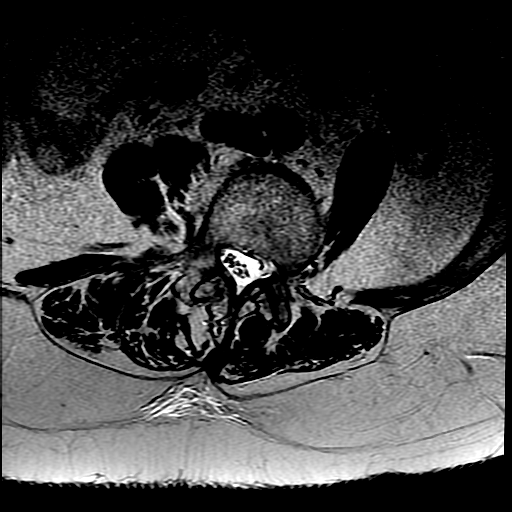
[im 32/36]
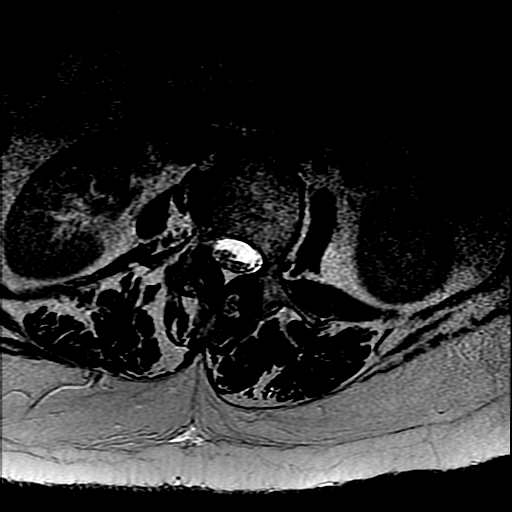

[18 of 48 positions shown; findings below may reference images not displayed]

FINDINGS: Segmentation:  Standard.

Alignment: Moderate to severe lumbar levoscoliosis. Straightening of
the normal lumbar lordosis. Trace anterolisthesis of L3 on L4.

Vertebrae: No fracture or suspicious marrow lesion. Degenerative
endplate changes from L3-S1 including mild degenerative edema at
L4-5.

Conus medullaris and cauda equina: Conus extends to the T12-L1
level. Conus and cauda equina appear normal.

Paraspinal and other soft tissues: Unremarkable.

Disc levels:

Disc desiccation and disc space narrowing from L2-3 to L5-S1 with
narrowing being most severe at L4-5.

L1-2: Mild to moderate facet and ligamentum flavum hypertrophy and a
shallow left subarticular to left foraminal disc protrusion without
stenosis.

L2-3: Mild disc bulging and mild-to-moderate facet and ligamentum
flavum hypertrophy without stenosis.

L3-4: Anterolisthesis with bulging uncovered disc, asymmetric
right-sided disc space height loss, and severe facet arthrosis
result in mild right neural foraminal stenosis without significant
spinal stenosis. Suspected partial right facet ankylosis.

L4-5: Disc bulging, severe disc space height loss, mild ligamentum
flavum hypertrophy, and severe facet arthrosis result in mild spinal
stenosis, mild bilateral lateral recess stenosis, and mild bilateral
neural foraminal stenosis.

L5-S1: Left eccentric disc bulging, asymmetrically severe left-sided
disc space height loss, and moderate facet arthrosis result in
mild-to-moderate left neural foraminal stenosis without spinal
stenosis.
IMPRESSION: 1. Lumbar levoscoliosis with advanced disc and facet degeneration.
2. Mild spinal stenosis at L4-5.
3. Mild-to-moderate multilevel neural foraminal stenosis.

## 2021-03-11 NOTE — Telephone Encounter (Signed)
Encounter open in error 

## 2021-03-11 NOTE — Progress Notes (Signed)
Patient here today at [facility] for MRI lumbar spine wo contrast. Patient has St. Jude device, gallant model. Unable to send transmission. Drew-rep at the bedside to perform interrogation and program patient. Tachy therapy disabled and pacing off. Will re-program once scan is complete.

## 2021-03-14 ENCOUNTER — Other Ambulatory Visit (HOSPITAL_COMMUNITY): Payer: Self-pay | Admitting: Cardiology

## 2021-03-14 DIAGNOSIS — M4316 Spondylolisthesis, lumbar region: Secondary | ICD-10-CM | POA: Diagnosis not present

## 2021-03-14 DIAGNOSIS — M5416 Radiculopathy, lumbar region: Secondary | ICD-10-CM | POA: Diagnosis not present

## 2021-03-14 DIAGNOSIS — M419 Scoliosis, unspecified: Secondary | ICD-10-CM | POA: Diagnosis not present

## 2021-03-14 DIAGNOSIS — M5136 Other intervertebral disc degeneration, lumbar region: Secondary | ICD-10-CM | POA: Diagnosis not present

## 2021-04-02 DIAGNOSIS — M5416 Radiculopathy, lumbar region: Secondary | ICD-10-CM | POA: Diagnosis not present

## 2021-04-11 ENCOUNTER — Other Ambulatory Visit (HOSPITAL_COMMUNITY): Payer: Self-pay | Admitting: Cardiology

## 2021-05-14 ENCOUNTER — Ambulatory Visit (INDEPENDENT_AMBULATORY_CARE_PROVIDER_SITE_OTHER): Payer: 59

## 2021-05-14 DIAGNOSIS — I469 Cardiac arrest, cause unspecified: Secondary | ICD-10-CM | POA: Diagnosis not present

## 2021-05-14 LAB — CUP PACEART REMOTE DEVICE CHECK
Battery Remaining Longevity: 103 mo
Battery Remaining Percentage: 87 %
Battery Voltage: 3.01 V
Brady Statistic RV Percent Paced: 1 %
Date Time Interrogation Session: 20230131010116
HighPow Impedance: 96 Ohm
Implantable Lead Implant Date: 20211102
Implantable Lead Location: 753860
Implantable Pulse Generator Implant Date: 20211102
Lead Channel Impedance Value: 430 Ohm
Lead Channel Pacing Threshold Amplitude: 0.75 V
Lead Channel Pacing Threshold Pulse Width: 0.5 ms
Lead Channel Sensing Intrinsic Amplitude: 11.7 mV
Lead Channel Setting Pacing Amplitude: 3.5 V
Lead Channel Setting Pacing Pulse Width: 0.5 ms
Lead Channel Setting Sensing Sensitivity: 0.5 mV
Pulse Gen Serial Number: 111028702

## 2021-05-22 NOTE — Progress Notes (Signed)
Remote ICD transmission.   

## 2021-05-31 ENCOUNTER — Telehealth (HOSPITAL_COMMUNITY): Payer: Self-pay | Admitting: Cardiology

## 2021-05-31 NOTE — Telephone Encounter (Signed)
I scheduled appt for pt on 0/37 for echo, then App, does she need an lab appt. Please advise

## 2021-05-31 NOTE — Telephone Encounter (Signed)
Labs will be drawn at appt if needed

## 2021-06-17 ENCOUNTER — Telehealth (HOSPITAL_COMMUNITY): Payer: Self-pay

## 2021-06-17 NOTE — Telephone Encounter (Signed)
error 

## 2021-06-17 NOTE — Telephone Encounter (Signed)
Called and spoke to patient's husband Kristina Nichols to give patient message to confirm/remind patient of their appointment at the [facility] on 06/18/21.  ? ?Patient reminded to bring all medications and/or complete list. ? ?Confirmed patient has transportation. Gave directions, instructed to utilize valet parking. ? ?Confirmed appointment prior to ending call.  ? ?

## 2021-06-18 ENCOUNTER — Ambulatory Visit (HOSPITAL_BASED_OUTPATIENT_CLINIC_OR_DEPARTMENT_OTHER)
Admission: RE | Admit: 2021-06-18 | Discharge: 2021-06-18 | Disposition: A | Discharge status: Home / Self Care | Payer: 59 | Source: Ambulatory Visit | Attending: Family Medicine | Admitting: Family Medicine

## 2021-06-18 ENCOUNTER — Other Ambulatory Visit: Payer: Self-pay

## 2021-06-18 ENCOUNTER — Encounter (HOSPITAL_COMMUNITY): Payer: Self-pay

## 2021-06-18 ENCOUNTER — Telehealth: Payer: Self-pay | Admitting: Pharmacist

## 2021-06-18 ENCOUNTER — Ambulatory Visit (HOSPITAL_COMMUNITY)
Admission: RE | Admit: 2021-06-18 | Discharge: 2021-06-18 | Disposition: A | Discharge status: Home / Self Care | Payer: 59 | Source: Ambulatory Visit | Attending: Family Medicine | Admitting: Family Medicine

## 2021-06-18 VITALS — BP 120/70 | HR 56 | Wt 198.0 lb

## 2021-06-18 DIAGNOSIS — E785 Hyperlipidemia, unspecified: Secondary | ICD-10-CM | POA: Insufficient documentation

## 2021-06-18 DIAGNOSIS — F419 Anxiety disorder, unspecified: Secondary | ICD-10-CM | POA: Insufficient documentation

## 2021-06-18 DIAGNOSIS — I472 Ventricular tachycardia, unspecified: Secondary | ICD-10-CM | POA: Insufficient documentation

## 2021-06-18 DIAGNOSIS — Z95810 Presence of automatic (implantable) cardiac defibrillator: Secondary | ICD-10-CM | POA: Insufficient documentation

## 2021-06-18 DIAGNOSIS — Z79899 Other long term (current) drug therapy: Secondary | ICD-10-CM | POA: Insufficient documentation

## 2021-06-18 DIAGNOSIS — M419 Scoliosis, unspecified: Secondary | ICD-10-CM | POA: Insufficient documentation

## 2021-06-18 DIAGNOSIS — I469 Cardiac arrest, cause unspecified: Secondary | ICD-10-CM

## 2021-06-18 DIAGNOSIS — M545 Low back pain, unspecified: Secondary | ICD-10-CM | POA: Insufficient documentation

## 2021-06-18 DIAGNOSIS — F32A Depression, unspecified: Secondary | ICD-10-CM | POA: Insufficient documentation

## 2021-06-18 DIAGNOSIS — I5022 Chronic systolic (congestive) heart failure: Secondary | ICD-10-CM

## 2021-06-18 DIAGNOSIS — Z6832 Body mass index (BMI) 32.0-32.9, adult: Secondary | ICD-10-CM | POA: Insufficient documentation

## 2021-06-18 DIAGNOSIS — Z8674 Personal history of sudden cardiac arrest: Secondary | ICD-10-CM | POA: Diagnosis not present

## 2021-06-18 DIAGNOSIS — I5181 Takotsubo syndrome: Secondary | ICD-10-CM

## 2021-06-18 DIAGNOSIS — R69 Illness, unspecified: Secondary | ICD-10-CM | POA: Diagnosis not present

## 2021-06-18 DIAGNOSIS — E669 Obesity, unspecified: Secondary | ICD-10-CM | POA: Insufficient documentation

## 2021-06-18 DIAGNOSIS — Z7984 Long term (current) use of oral hypoglycemic drugs: Secondary | ICD-10-CM | POA: Insufficient documentation

## 2021-06-18 LAB — BASIC METABOLIC PANEL
Anion gap: 5 (ref 5–15)
BUN: 15 mg/dL (ref 6–20)
CO2: 28 mmol/L (ref 22–32)
Calcium: 9.3 mg/dL (ref 8.9–10.3)
Chloride: 107 mmol/L (ref 98–111)
Creatinine, Ser: 0.94 mg/dL (ref 0.44–1.00)
GFR, Estimated: >60 mL/min (ref >60)
Glucose, Bld: 99 mg/dL (ref 70–99)
Potassium: 4.3 mmol/L (ref 3.5–5.1)
Sodium: 140 mmol/L (ref 135–145)

## 2021-06-18 LAB — ECHOCARDIOGRAM COMPLETE
AR max vel: 2.3 cm2
AV Area VTI: 2.01 cm2
AV Area mean vel: 2.12 cm2
AV Mean grad: 5 mmHg
AV Peak grad: 9.7 mmHg
Ao pk vel: 1.56 m/s
Area-P 1/2: 3.21 cm2
Calc EF: 65.4 %
S' Lateral: 3.3 cm
Single Plane A2C EF: 63.3 %
Single Plane A4C EF: 63.2 %

## 2021-06-18 LAB — LIPID PANEL
Cholesterol: 234 mg/dL — ABNORMAL HIGH (ref 0–200)
HDL: 72 mg/dL (ref >40)
LDL Cholesterol: 150 mg/dL — ABNORMAL HIGH (ref 0–99)
Total CHOL/HDL Ratio: 3.3 RATIO
Triglycerides: 58 mg/dL (ref <150)
VLDL: 12 mg/dL (ref 0–40)

## 2021-06-18 LAB — BRAIN NATRIURETIC PEPTIDE: B Natriuretic Peptide: 80.1 pg/mL (ref 0.0–100.0)

## 2021-06-18 NOTE — Telephone Encounter (Signed)
Pt referred to PharmD to initiate GLP1RA therapy for weight loss. Insurance does not cover [facility]. No history of DM. 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 ?

## 2021-06-18 NOTE — Patient Instructions (Addendum)
Thank you for coming in today ? ?Labs were done today, if any labs are abnormal the clinic will call you ? ?You have been referred for Semeglutide they will contact you for further information ? ?Your physician recommends that you schedule a follow-up appointment in:  ?6 months with Dr. Shirlee Latch please call in June/July for appointment ? ?You have been referred to cardiac rehab their office will call you to set up appointment ? ?At the [facility], you and your health needs are our priority. As part of our continuing mission to provide you with exceptional heart care, we have created designated Provider Care Teams. These Care Teams include your primary Cardiologist (physician) and Advanced Practice Providers (APPs- Physician Assistants and Nurse Practitioners) who all work together to provide you with the care you need, when you need it.  ? ?You may see any of the following providers on your designated Care Team at your next follow up: ?Dr Arvilla Meres ?Dr Marca Ancona ?Tonye Becket, NP ?Robbie Lis, PA ?Jessica Milford,NP ?Anna Genre, PA ?Karle Plumber, PharmD ? ? ?Please be sure to bring in all your medications bottles to every appointment.  ? ?If you have any questions or concerns before your next appointment please send [place] a message through [place] or call our office at [phone].   ? ?TO LEAVE A MESSAGE FOR THE NURSE SELECT OPTION 2, PLEASE LEAVE A MESSAGE INCLUDING: ?YOUR NAME ?DATE OF BIRTH ?CALL BACK NUMBER ?REASON FOR CALL**this is important as we prioritize the call backs ? ?YOU WILL RECEIVE A CALL BACK THE SAME DAY AS LONG AS YOU CALL BEFORE 4:00 PM ? ?

## 2021-06-18 NOTE — Progress Notes (Signed)
ReDS Vest / Clip - 06/18/21 0900   ? ?  ? ReDS Vest / Clip  ? Station Marker B   ? Ruler Value 33   ? ReDS Value Range Low volume   ? ReDS Actual Value 23   ? ?  ?  ? ?  ? ? ?

## 2021-06-18 NOTE — Progress Notes (Signed)
?  Echocardiogram ?2D Echocardiogram has been performed. ? ?Kristina Nichols ?06/18/2021, 9:01 AM ?

## 2021-06-18 NOTE — Progress Notes (Signed)
?Advanced Heart Failure Clinic Note  ? ?PCP: Patient, No Pcp Per (Inactive) ?Cardiology: Dr. Aundra Dubin   ? ?HPI: ? ?Kristina Nichols is a 56 y.o. with h/o depression, witnessed out of hospital cardiac arrest 10/21 (received 7 minutes of CPR) at home with refractory vib/ torsades. ?  ?Prior to her 10/21 admission, she had been under a lot of stress. Witnessed cardiac arrest at home (CPR 7 min) with refractory vib/ torsades. Had cardioversion at home followed by transport to [facility]. Intubated in ER.  Cardiac cath showed normal coronaries. LV gram EF 20% concerning for Takotsubo cardiomyopathy. Required multiple cardioversions post cath for recurrent torsades. On amio drip initially but had prolonged QTc so amiodarone was stopped. Lidocaine then started. Had recurrent torsades again and received another shock. EP consulted.  St Jude ICD placed. Placed on GDMT for systolic heart failure. Mexiletine added for antiarrhymic therapy. Had cMRI prior to d/c which showed interval improvement in LVEF up to 56% suggestive of recovery of Takotsubo cardiomyopathy. ? ?Echo in 2/22 showed EF 50-55%, normal RV.  ? ?Echo today 06/18/21, results pending. ? ?Today she returns for HF follow up. She gets weak walking up stairs  Able to carry laundry without much SOB. She has swelling in legs and arms. Had some palpitations today. Struggling with anxiety. Want to work out but it is her understanding she cannot do much physical activity because of her ICD. Continues with low back pain from scoliosis. Denies CP, dizziness, abnormal bleeding or PND/Orthopnea. Appetite ok. No fever or chills. Weight at home 193 pounds. Taking all medications.  ? ?ECG (personally reviewed): none ordered today. ? ?Device interrogation (personally reviewed): CorVue stable, <1 % v-pacing, no shocks, 4 hours daily activity. ? ?REDs: 23% ? ?Labs (1/22): K 4, creatinine 0.89 ?Labs (8/22): K 4.1, creatinine 0.95 ? ?PMH: ?1. Anxiety.  ?2. Scoliosis ?3. GERD ?4. Takotsubo  cardiomyopathy: Occurred in setting of severe emotional stress in 10/21.  ?- Cath in 10/21 with normal coronaries, EF 20% on LV-gram.  ?- Echo (10/21) with EF < 20%, global HK, normal RV.  ?- Cardiac MRI (11/21): EF 56%, RVEF 47%, no LGE.  ?- Echo (2/22): EF 99991111, normal diastolic function, normal RV.  ?5. VT: Multiple episodes of torsades de pointes in setting of Takotsubo cardiomyopathy.  ?- St Jude ICD placed.  ? ?Review of systems complete and found to be negative unless listed in HPI.   ? ? ?Current Outpatient Medications  ?Medication Sig Dispense Refill  ? acetaminophen (TYLENOL) 500 MG tablet Take 1,000 mg by mouth in the morning, at noon, and at bedtime.    ? buPROPion (WELLBUTRIN XL) 300 MG 24 hr tablet Take 300 mg by mouth in the morning.    ? carvedilol (COREG) 6.25 MG tablet TAKE 1 TABLET BY MOUTH 2 TIMES DAILY WITH A MEAL. 180 tablet 3  ? clonazePAM (KLONOPIN) 1 MG tablet Take 1 mg by mouth 3 (three) times daily as needed for anxiety.    ? dapagliflozin propanediol (FARXIGA) 10 MG TABS tablet Take 1 tablet (10 mg total) by mouth daily before breakfast. 90 tablet 3  ? lamoTRIgine (LAMICTAL) 150 MG tablet Patient takes 150 mg by mouth at night and takes 100 mg in the morning.    ? losartan (COZAAR) 25 MG tablet Take 1 tablet (25 mg total) by mouth daily. 90 tablet 3  ? omeprazole (PRILOSEC OTC) 20 MG tablet Take 20 mg by mouth daily as needed (for heartburn or indigestion).    ?  spironolactone (ALDACTONE) 25 MG tablet TAKE 1 TABLET (25 MG TOTAL) BY MOUTH DAILY. 90 tablet 3  ? ?No current facility-administered medications for this encounter.  ? ? ?No Known Allergies ? ?  ?Social History  ? ?Socioeconomic History  ? Marital status: Married  ?  Spouse name: Kristina Nichols  ? Number of children: Not on file  ? Years of education: Not on file  ? Highest education level: Not on file  ?Occupational History  ? Not on file  ?Tobacco Use  ? Smoking status: Never  ? Smokeless tobacco: Never  ?Vaping Use  ? Vaping Use:  Never used  ?Substance and Sexual Activity  ? Alcohol use: Not Currently  ? Drug use: Never  ? Sexual activity: Not on file  ?Other Topics Concern  ? Not on file  ?Social History Narrative  ? Not on file  ? ?Social Determinants of Health  ? ?Financial Resource Strain: Not on file  ?Food Insecurity: Not on file  ?Transportation Needs: Not on file  ?Physical Activity: Not on file  ?Stress: Not on file  ?Social Connections: Not on file  ?Intimate Partner Violence: Not on file  ? ?Family History  ?Problem Relation Age of Onset  ? Alzheimer's disease Father   ? Chronic granulomatous disease Son   ? ?BP 120/70   Pulse (!) 56   Wt 89.8 kg (198 lb)   SpO2 96%   BMI 32.95 kg/m?  ? ?Wt Readings from Last 3 Encounters:  ?06/18/21 89.8 kg (198 lb)  ?01/01/21 90.6 kg (199 lb 12.8 oz)  ?06/05/20 76.5 kg (168 lb 9.6 oz)  ? ?PHYSICAL EXAM: ?General:  NAD. No resp difficulty, mildly anxious appearing and tearful ?HEENT: Normal ?Neck: Supple. No JVD. Carotids 2+ bilat; no bruits. No lymphadenopathy or thryomegaly appreciated. ?Cor: PMI nondisplaced. Regular rate & rhythm. No rubs, gallops or murmurs. ?Lungs: Clear ?Abdomen: Obese, nontender, nondistended. No hepatosplenomegaly. No bruits or masses. Good bowel sounds. ?Extremities: No cyanosis, clubbing, rash, edema ?Neuro: Alert & oriented x 3, cranial nerves grossly intact. Moves all 4 extremities w/o difficulty. + anxious ? ?ASSESSMENT & PLAN: ?1. VT: Had out of hospital cardiac arrest 10/21 due to recurrent torsades with multiple shocks.  Coronaries normal in 10/21, EF <20% on echo.  Amiodarone stopped due to prolonged QT interval. Suspect that prolonged QT was the sequelae of stress (Takotsubo-type) cardiomyopathy. S/p St Jude ICD.  Cardiac MRI in 11/21 showed no delayed enhancement.  Echo in 2/22 with EF improved to 50-55%.   Echo today, results pending. ?- Continue Coreg 6.25 mg bid.  ?2. Takotsubo cardiomyopathy: Echo 10/21 w/  EF < 20%  (no LV thrombus), cath in 10/21  with normal coronaries.  Suspect stress (Takotsubo-type) cardiomyopathy. Recent emotional stress. Cardiac MRI 11/21 showed LV EF back up to normal range (EF 56%) with no delayed enhancement => this is supportive of stress cardiomyopathy.  Echo in 2/22 showed EF 50-55%.  NYHA Class I-II. Euvolemic on exam and by CorVue.  Weight remains up, is likely caloric rather than due to CHF.  ?- She did not tolerate Entresto as it caused her to feel depressed.  Continue losartan 25 mg daily.  ?- Continue spironolactone 25 mg daily. ?- Continue Coreg 6.25 mg bid. ?- Continue Farxiga 10 mg daily. No GU symptoms. ?- Refer to Cardiac Rehab ?3. Psych/neuro: Significant anxiety at baseline.  ?4. Obesity: Body mass index is 32.95 kg/m?Marland Kitchen Weight gain appears caloric.  Insurance does not cover [employer] [facility]. Wants  to start working out, we discussed starting slow and focusing on setting a routine. ?- Refer to Cardiac Rehab. ?- Refer to Pharmacy for semaglutide. ? ?Followup in 6 months with Dr. Aundra Dubin. She was given a list of PCPs to establish care at today's visit. ?  ?Rafael Bihari, FNP ?06/18/21 ?

## 2021-06-19 ENCOUNTER — Telehealth (HOSPITAL_COMMUNITY): Payer: Self-pay

## 2021-06-19 MED ORDER — OZEMPIC (0.25 OR 0.5 MG/DOSE) 2 MG/1.5ML ~~LOC~~ SOPN: PEN_INJECTOR | SUBCUTANEOUS | 1 refills | Status: DC

## 2021-06-19 NOTE — Telephone Encounter (Signed)
Called patient to see if she is interested in the Cardiac Rehab Program. Patient expressed interest. Explained scheduling process and went over insurance, patient verbalized understanding. Will contact patient for scheduling at a later date. °

## 2021-06-19 NOTE — Telephone Encounter (Signed)
Pt insurance is active and benefits verified through [employer] Co-pay 0, DED $1,000/$862.15 met, out of pocket $4,000/$1,985.48 met, co-insurance 20%. no pre-authorization required. Passport, 06/19/2021_0 :21pm, REF# [id number] ? ?Will contact patient to see if she is interested in the Cardiac Rehab Program. If interested, patient will need to complete follow up appt. Once completed, patient will be contacted for scheduling upon review by the RN Navigator. ?

## 2021-06-19 NOTE — Telephone Encounter (Signed)
[facility] prior authorization denied - her plan does not cover weight loss medications. She does not have DM but her insurance does have Ozempic available on formulary without PA requirement. This could change in the future in which case she would need to stop therapy due to off label use, and weight gain is typically seen with GLP discontinuation. ? ?Spoke with pt. She has been eating a low carb diet, cut out sugar and soda after gaining about 40 lbs in a year. Also reports 10 lb weight gain on Lyrica. Reports some GI issues, doesn't really have portion control issues. She is still interested in trying Ozempic. Denies personal or family history of thyroid cancer. Rx sent to pharmacy and appt with PharmD scheduled next available on 3/30. ? ?20 minutes were spent on phone with pt discussing med ahead of visit. ?

## 2021-06-21 ENCOUNTER — Encounter (HOSPITAL_COMMUNITY): Payer: Self-pay | Admitting: *Deleted

## 2021-06-21 NOTE — Progress Notes (Signed)
Received referral from Dr. Shirlee Latch for this pt to participate in Cardiac rehab with the diagnosis of chronic systolic heart failure. Clinical review of pt follow up appt on 3/7 with Prince Rome NP with Dr. Shirlee Latch - heart failure clinic/cardiologist office note. Pt is making the expected progress in recovery.  Pt appropriate for scheduling for on site cardiac rehab and/or enrollment in Virtual Cardiac Rehab.  Pt Covid Risk Score is 2.  Will forward to staff for follow up. Alanson Aly, BSN ?Cardiac and Pulmonary Rehab Nurse Navigator  ? ?

## 2021-06-30 NOTE — Progress Notes (Deleted)
? ?Cardiology Office Note ?Date:  06/30/2021  ?Patient ID:  Kristina Nichols, DOB 12-May-1965, MRN [id number] ?PCP:  Patient, No Pcp Per (Inactive)  ?Cardiologist:  Dr. Aundra Dubin ?Electrophysiologist: Dr. Quentin Ore ? ?***refresh ?  ?Chief Complaint: *** 91 day ??? ? ?History of Present Illness: ?Kristina Nichols is a 56 y.o. female with history of depression/anxiety, hypothyroidism (off tx), GERD, hronic granulomatosis carrier ? ?Hospitalized 02/05/2020  ?W/OOH arrest brought emergently to the cath lab > normal coronaries, LVEF <20%, dysfunction with wall motion suggesting Takotsubo's cardiomyopathy  ?In hospital had recurrent VF/TdP requiring several defibrillations ?Dr. Quentin Ore saw her, noted triggered by a purkinje related PVC (originating from the left posterior fascicle). ?Ultimately underwent ICD implant 02/14/20 ?Discharged 02/15/2020 ? ?Looks like she has followed with HF team since seem last by them 06/18/2021, she had recovery of her LVEF, c/o edema, poor exertional capacity, under the impression she had limitations 2/2 the device. ?Struggling w/anxiety ?Updated echo was pending ?Intolerant of Entresto with increased feelings of depression ?Planned for cardiac rehab ?Needed a PCP ? ?*** no EP f/u  ?*** + remotes ?*** site ?*** outputs? ?Labs, meds etc... HF team ?*** volume ? ? ? ? ?Device information ?Abbot single chamber ICD implanted 02/14/2020 ?SECONDARY prevention ? ?AAD hx ?Amiodarone briefly on her hospital admission, stopped with concerns of QT prolongation ?Lidocaine gtt in the hospital >> to mexiletine ?No AAD out patient ? ? ?Past Medical History:  ?Diagnosis Date  ? Anxiety   ? CHF (congestive heart failure) ([place])   ? Depression   ? Hyperlipidemia   ? Hypothyroidism   ? Insomnia   ? Scoliosis   ? ? ?Past Surgical History:  ?Procedure Laterality Date  ? ABDOMINAL HYSTERECTOMY    ? CHOLECYSTECTOMY    ? IABP INSERTION N/A 02/06/2020  ? Procedure: IABP Insertion;  Surgeon: Larey Dresser, MD;  Location: [facility];  Service: Cardiovascular;  Laterality: N/A;  ? ICD IMPLANT N/A 02/14/2020  ? Procedure: ICD IMPLANT;  Surgeon: Evans Lance, MD;  Location: [facility];  Service: Cardiovascular;  Laterality: N/A;  ? LEFT HEART CATH AND CORONARY ANGIOGRAPHY N/A 02/05/2020  ? Procedure: LEFT HEART CATH AND CORONARY ANGIOGRAPHY;  Surgeon: Burnell Blanks, MD;  Location: [facility];  Service: Cardiovascular;  Laterality: N/A;  ? PARTIAL HYSTERECTOMY    ? RIGHT HEART CATH N/A 02/06/2020  ? Procedure: RIGHT HEART CATH;  Surgeon: Larey Dresser, MD;  Location: [facility];  Service: Cardiovascular;  Laterality: N/A;  ? TEMPORARY PACEMAKER N/A 02/05/2020  ? Procedure: TEMPORARY PACEMAKER;  Surgeon: Burnell Blanks, MD;  Location: [facility];  Service: Cardiovascular;  Laterality: N/A;  ? ? ?Current Outpatient Medications  ?Medication Sig Dispense Refill  ? acetaminophen (TYLENOL) 500 MG tablet Take 1,000 mg by mouth in the morning, at noon, and at bedtime.    ? buPROPion (WELLBUTRIN XL) 300 MG 24 hr tablet Take 300 mg by mouth in the morning.    ? carvedilol (COREG) 6.25 MG tablet TAKE 1 TABLET BY MOUTH 2 TIMES DAILY WITH A MEAL. 180 tablet 3  ? clonazePAM (KLONOPIN) 1 MG tablet Take 1 mg by mouth 3 (three) times daily as needed for anxiety.    ? dapagliflozin propanediol (FARXIGA) 10 MG TABS tablet Take 1 tablet (10 mg total) by mouth daily before breakfast. 90 tablet 3  ? lamoTRIgine (LAMICTAL) 150 MG tablet Patient takes 150 mg by mouth at night and takes 100 mg  in the morning.    ? losartan (COZAAR) 25 MG tablet Take 1 tablet (25 mg total) by mouth daily. 90 tablet 3  ? omeprazole (PRILOSEC OTC) 20 MG tablet Take 20 mg by mouth daily as needed (for heartburn or indigestion).    ? pregabalin (LYRICA) 100 MG capsule Take 100 mg by mouth daily.    ? Semaglutide,0.25 or 0.5MG /DOS, (OZEMPIC, 0.25 OR 0.5 MG/DOSE,) 2 MG/1.5ML SOPN Inject 0.25mg  subcutaneously once weekly for 4 weeks,  then increase to 0.5mg  weekly 1.5 mL 1  ? spironolactone (ALDACTONE) 25 MG tablet TAKE 1 TABLET (25 MG TOTAL) BY MOUTH DAILY. 90 tablet 3  ? ?No current facility-administered medications for this visit.  ? ? ?Allergies:   Patient has no known allergies.  ? ?Social History:  The patient  reports that she has never smoked. She has never used smokeless tobacco. She reports that she does not currently use alcohol. She reports that she does not use drugs.  ? ?Family History:  The patient's family history includes Alzheimer's disease in her father; Chronic granulomatous disease in her son. ? ?ROS:  Please see the history of present illness.    ?All other systems are reviewed and otherwise negative.  ? ?PHYSICAL EXAM:  ?VS:  There were no vitals taken for this visit. BMI: There is no height or weight on file to calculate BMI. ?Well nourished, well developed, in no acute distress ?HEENT: normocephalic, atraumatic ?Neck: no JVD, carotid bruits or masses ?Cardiac:  *** RRR; no significant murmurs, no rubs, or gallops ?Lungs:  *** CTA b/l, no wheezing, rhonchi or rales ?Abd: soft, nontender ?MS: no deformity or *** atrophy ?Ext: *** no edema ?Skin: warm and dry, no rash ?Neuro:  No gross deficits appreciated ?Psych: euthymic mood, full affect ? ?*** ICD site is stable, no tethering or discomfort ? ? ?EKG:  Done today and reviewed by myself shows  ?*** ? ?Device interrogation done today and reviewed by myself:  ?*** ? ?06/18/2021: TTE ? 1. Left ventricular ejection fraction, by estimation, is 55 to 60%. The  ?left ventricle has normal function. The left ventricle has no regional  ?wall motion abnormalities. Left ventricular diastolic parameters were  ?normal.  ? 2. Right ventricular systolic function is normal. The right ventricular  ?size is normal. There is normal pulmonary artery systolic pressure. The  ?estimated right ventricular systolic pressure is [id number] mmHg.  ? 3. The mitral valve is normal in structure. Trivial mitral  valve  ?regurgitation. No evidence of mitral stenosis.  ? 4. The aortic valve is tricuspid. Aortic valve regurgitation is trivial.  ?No aortic stenosis is present.  ? 5. The inferior vena cava is normal in size with greater than 50%  ?respiratory variability, suggesting right atrial pressure of 3 mmHg.  ? ? ?03/11/2021: c.MRI ?IMPRESSION: ?1.  Normal LV size and systolic function, EF 0000000. ?2.  Normal RV size and systolic function, EF 99991111 ?3. No myocardial LGE, so no definitive evidence for prior MI, ?myocarditis, or infiltrative disease. ?  ?Significant improvement in LV systolic function compared to prior ?echo. Suggestive of recovery of Takotsubo cardiomyopathy. ? ? ?02/06/2020: TTE ?IMPRESSIONS  ?1. No LV thrombus visualized, thought cannot excluded given LV substrate.  ?Left ventricular ejection fraction, by estimation, is <20%. The left  ?ventricle has severely decreased function. The left ventricle demonstrates  ?global hypokinesis. Left  ?ventricular diastolic parameters are consistent with Grade I diastolic  ?dysfunction (impaired relaxation).  ? 2. Right ventricular systolic function is normal.  The right ventricular  ?size is normal.  ? 3. The mitral valve is grossly normal. No evidence of mitral valve  ?regurgitation.  ? 4. The aortic valve was not well visualized. Aortic valve regurgitation  ?is trivial.  ? 5. Aortic IABP in Descending Aorta.  ? 6. The inferior vena cava is dilated in size with <50% respiratory  ?variability, suggesting right atrial pressure of 15 mmHg.  ? ?Comparison(s): No prior Echocardiogram.  ?  ?  ?02/06/2020: [facility], IABP insertion ?1. Mildly elevated left and right heart filling pressures.  ?2. Low cardiac output by thermodilution on norepinephrine 4, preserved output by Fick.  ?3. Successful IABP placement.  ?4. Swan left in place right IJ.  ?  ?  ?  ?02/06/2020: LHC ?There is severe left ventricular systolic dysfunction. ?LV end diastolic pressure is moderately elevated. ?The left  ventricular ejection fraction is less than 25% by visual estimate. ?There is no mitral valve regurgitation. ?  ?1. No angiographic evidence of CAD ?2. Severe segmental LV systolic dysfunction with wall

## 2021-07-03 ENCOUNTER — Encounter: Payer: 59 | Admitting: Physician Assistant

## 2021-07-11 ENCOUNTER — Ambulatory Visit: Payer: 59

## 2021-07-11 NOTE — Progress Notes (Deleted)
Patient ID: Kristina Nichols                 DOB: Jun 09, 1965                    MRN: [id number] ? ? ? ? ?HPI: ?Kristina Nichols is a 56 y.o. female patient referred to pharmacy clinic by Allena Katz, NP to initiate weight loss therapy with GLP1-RA. PMH is significant for cardiac arrest, recurrent torsades, Takotsubo cardiomyopathy and EF of 20%, ICD placed, ICD now up to 55-60%. Most recent BMI 33. ? ?[facility] prior authorization denied - her plan does not cover weight loss medications. She does not have DM but her insurance does have Ozempic available on formulary without PA requirement. This could change in the future in which case she would need to stop therapy due to off label use, and weight gain is typically seen with GLP discontinuation. ?  ?Spoke with pt. She has been eating a low carb diet, cut out sugar and soda after gaining about 40 lbs in a year. Also reports 10 lb weight gain on Lyrica. Reports some GI issues, doesn't really have portion control issues. She is still interested in trying Ozempic. Denies personal or family history of thyroid cancer.  ? ?She presents today for Ozempic injection training. ? ?Check weight ? ?Referred to cardiac rehab to slowly increase activity - did she start yet? ? ?Review no DM so off label use and may not be covered in future, trials have showed 2/3 weight regain ? ?Other meds tried in past? ? ?Current weight management medications: none ? ?Previously tried meds: ? ?Current meds that may affect weight: Lyrica (weight gain), lamotrigine (weight loss or weight gain), bupropion (weight loss) ? ?Baseline weight/BMI: ? ?Insurance payor: Com[employer]roller ? ?Diet:  ?-Breakfast: ?-Lunch: ?-Dinner: ?-Snacks: ?-Drinks: ? ?Exercise: Weak walking up stairs, low back pain from scoliosis. ? ?Family History: Father with Alzheimer's disease. ? ?Social History: Denies tobacco, alcohol and drug use. ? ?Labs: ?Lab Results  ?Component Value Date  ? HGBA1C 5.3 02/06/2020  ? ? ?Wt Readings from  Last 1 Encounters:  ?06/18/21 198 lb (89.8 kg)  ? ? ?BP Readings from Last 1 Encounters:  ?06/18/21 120/70  ? ?Pulse Readings from Last 1 Encounters:  ?06/18/21 (!) 56  ? ? ?   ?Component Value Date/Time  ? CHOL 234 (H) 06/18/2021 1028  ? TRIG 58 06/18/2021 1028  ? HDL 72 06/18/2021 1028  ? CHOLHDL 3.3 06/18/2021 1028  ? VLDL 12 06/18/2021 1028  ? LDLCALC 150 (H) 06/18/2021 1028  ? ? ?Past Medical History:  ?Diagnosis Date  ? Anxiety   ? CHF (congestive heart failure) ([place])   ? Depression   ? Hyperlipidemia   ? Hypothyroidism   ? Insomnia   ? Scoliosis   ? ? ?Current Outpatient Medications on File Prior to Visit  ?Medication Sig Dispense Refill  ? acetaminophen (TYLENOL) 500 MG tablet Take 1,000 mg by mouth in the morning, at noon, and at bedtime.    ? buPROPion (WELLBUTRIN XL) 300 MG 24 hr tablet Take 300 mg by mouth in the morning.    ? carvedilol (COREG) 6.25 MG tablet TAKE 1 TABLET BY MOUTH 2 TIMES DAILY WITH A MEAL. 180 tablet 3  ? clonazePAM (KLONOPIN) 1 MG tablet Take 1 mg by mouth 3 (three) times daily as needed for anxiety.    ? dapagliflozin propanediol (FARXIGA) 10 MG TABS tablet Take 1 tablet (10 mg total) by mouth daily  before breakfast. 90 tablet 3  ? lamoTRIgine (LAMICTAL) 150 MG tablet Patient takes 150 mg by mouth at night and takes 100 mg in the morning.    ? losartan (COZAAR) 25 MG tablet Take 1 tablet (25 mg total) by mouth daily. 90 tablet 3  ? omeprazole (PRILOSEC OTC) 20 MG tablet Take 20 mg by mouth daily as needed (for heartburn or indigestion).    ? pregabalin (LYRICA) 100 MG capsule Take 100 mg by mouth daily.    ? Semaglutide,0.25 or 0.5MG/DOS, (OZEMPIC, 0.25 OR 0.5 MG/DOSE,) 2 MG/1.5ML SOPN Inject 0.36m subcutaneously once weekly for 4 weeks, then increase to 0.586mweekly 1.5 mL 1  ? spironolactone (ALDACTONE) 25 MG tablet TAKE 1 TABLET (25 MG TOTAL) BY MOUTH DAILY. 90 tablet 3  ? ?No current facility-administered medications on file prior to visit.  ? ? ?No Known  Allergies ? ? ?Assessment/Plan: ? ?1. Weight loss - Patient has not met goal of at least 5% of body weight loss with comprehensive lifestyle modifications alone in the past 3-6 months. Pharmacotherapy is appropriate to pursue as augmentation. Will start ***. Confirmed patient not ***pregnant and no personal or family history of medullary thyroid carcinoma (MTC) or Multiple Endocrine Neoplasia syndrome type 2 (MEN 2).  ? ?Advised patient on common side effects including nausea, diarrhea, dyspepsia, decreased appetite, and fatigue. Counseled patient on reducing meal size and how to titrate medication to minimize side effects. Counseled patient to call if intolerable side effects or if experiencing dehydration, abdominal pain, or dizziness. Patient will adhere to dietary modifications and will target at least 150 minutes of moderate intensity exercise weekly.  ? ?Injection technique reviewed at today's visit and patient successfully self-administered first dose of *** into the fatty tissue of the abdomen. ? ?Titration Plan:  ?Will plan to follow the titration plan as below, pending patient is tolerating each dose before increasing to the next. Can slow titration if needed for tolerability.  ?  ?-Month 1: Inject *** SQ once weekly x 4 weeks ?-Month 2: Inject *** SQ once weekly x 4 weeks ?-Month 3: Inject *** SQ once weekly x 4 weeks ?-Month 4+: Inject *** SQ once weekly  ? ?Follow up in ***. ? ?

## 2021-07-18 ENCOUNTER — Ambulatory Visit: Payer: 59 | Admitting: Surgery

## 2021-08-05 NOTE — Progress Notes (Signed)
Patient ID: Kristina Nichols                 DOB: 03-Jan-1966                    MRN: [id number] ? ? ? ? ?HPI: ?Kristina Nichols is a 56 y.o. female patient referred to pharmacy clinic by Allena Katz, FNP to initiate weight loss therapy with GLP1-RA. PMH is significant for obesity, anxiety/depression, scoliosis, GERD, takotsubo cardiopathy, V tach s/p [employer] ICD placed in 01/2020. Most recent BMI 32.95. Saw Allena Katz FNP on 06/18/21 for HF f/u. Discussed that weight gain appeared caloric. Pt stated she wanted to start working out and they discussed focusing on a routine.  ? ?Pt was referred to pharmacy for [facility] start. Her plan does not cover weight loss medications and a prior auth for [facility] was denied. Ozempic is on formulary with [place] requirement, but pt does not have DM so coverage could change since it is being used off-label. I spoke with pt on 06/19/21 - pt stated she has been eating a low carb diet, cut out sugar and soda after gaining ~40 lbs in one year. Also reported a 10 lb weight gain on Lyrica. Did report some GI issues and does not have portion control issues. She denied personal or family history of thyroid cancer. She was still interested in trying Ozempic so Rx was sent to pharmacy and pt scheduled for appointment on 08/06/21 for Ozempic start. ? ?Pt presents today for [facility] appointment. Pt has started the medication prior to visit. She gave herself 3 doses of the 0.78m dose then self-increased to 0.532monce weekly. She has given herself 2 doses of the 0.59m859mose and is on her second pen. She is having side effects with the med including decreased appetite, slight constipation, and nausea, but reports they are tolerable. Has been eating eating smaller portions and staying hydrated with water to help. Her current home weight is 183 lbs which is 15 lbs down from her 06/18/21 appt. ? ?Current weight management medications: none ? ?Previously tried meds: none ? ?Current meds that may affect weight:   ?Increase weight: pregabalin, carvedilol ?Decrease weight: bupropion, dapagliflozin ? ?Baseline weight/BMI: 198 (32.95) ? ?Insurance payor: [employer] ?Diet: ?Started eating smaller portions ?-Breakfast: oatmeal with cinnamon ?-Lunch: did not discuss ?-Dinner: did not discuss ?-Snacks: blueberries, strawberries, nuts ?-Drinks: Starbucks coffee at home (almond creamer, 2.5 tsp of sugar), water ? ?Exercise: Used to walk every day, but stopped after her husband sprained his ankle. Wants to get back to walking. Does have a gym membership ? ?Family History: None pertinent ? ?Social History: Denies tobacco, illicit drug, and alcohol use ? ?Labs: ?Lab Results  ?Component Value Date  ? HGBA1C 5.3 02/06/2020  ? ? ?Wt Readings from Last 1 Encounters:  ?06/18/21 198 lb (89.8 kg)  ? ? ?BP Readings from Last 1 Encounters:  ?06/18/21 120/70  ? ?Pulse Readings from Last 1 Encounters:  ?06/18/21 (!) 56  ? ? ?   ?Component Value Date/Time  ? CHOL 234 (H) 06/18/2021 1028  ? TRIG 58 06/18/2021 1028  ? HDL 72 06/18/2021 1028  ? CHOLHDL 3.3 06/18/2021 1028  ? VLDL 12 06/18/2021 1028  ? LDLCALC 150 (H) 06/18/2021 1028  ? ? ?Past Medical History:  ?Diagnosis Date  ? Anxiety   ? [place] Depression   ? Hyperlipidemia   ? Hypothyroidism   ?  Insomnia   ? Scoliosis   ? ? ?Current Outpatient Medications on File Prior to Visit  ?Medication Sig Dispense Refill  ? acetaminophen (TYLENOL) 500 MG tablet Take 1,000 mg by mouth in the morning, at noon, and at bedtime.    ? buPROPion (WELLBUTRIN XL) 300 MG 24 hr tablet Take 300 mg by mouth in the morning.    ? carvedilol (COREG) 6.25 MG tablet TAKE 1 TABLET BY MOUTH 2 TIMES DAILY WITH A MEAL. 180 tablet 3  ? clonazePAM (KLONOPIN) 1 MG tablet Take 1 mg by mouth 3 (three) times daily as needed for anxiety.    ? dapagliflozin propanediol (FARXIGA) 10 MG TABS tablet Take 1 tablet (10 mg total) by mouth daily before breakfast. 90 tablet 3  ?  lamoTRIgine (LAMICTAL) 150 MG tablet Patient takes 150 mg by mouth at night and takes 100 mg in the morning.    ? losartan (COZAAR) 25 MG tablet Take 1 tablet (25 mg total) by mouth daily. 90 tablet 3  ? omeprazole (PRILOSEC OTC) 20 MG tablet Take 20 mg by mouth daily as needed (for heartburn or indigestion).    ? pregabalin (LYRICA) 100 MG capsule Take 100 mg by mouth daily.    ? Semaglutide,0.25 or 0.5MG/DOS, (OZEMPIC, 0.25 OR 0.5 MG/DOSE,) 2 MG/1.5ML SOPN Inject 0.19m subcutaneously once weekly for 4 weeks, then increase to 0.550mweekly 1.5 mL 1  ? spironolactone (ALDACTONE) 25 MG tablet TAKE 1 TABLET (25 MG TOTAL) BY MOUTH DAILY. 90 tablet 3  ? ?No current facility-administered medications on file prior to visit.  ? ?No Known Allergies ? ?Assessment/Plan: ? ?1. Weight loss - Patient has not met goal of at least 5% of body weight loss with comprehensive lifestyle modifications alone in the past 3-6 months. Pharmacotherapy is appropriate to pursue as augmentation. Will continue Ozempic 0.75m89mubcutaneously weekly for 2 more weeks and then can increase to 1mg34mse based on tolerability. ? ?Advised patient on common side effects including nausea, diarrhea, dyspepsia, decreased appetite, and fatigue. Counseled patient on reducing meal size. ? ?Titration Plan:  ?Will plan to follow the titration plan as below. Can slow titration if needed for tolerability.  ?  ?-Continue Ozempic 0.50 mg subcutaneously once weekly for 2 more weeks ?-Then inject Ozempic 1 mg subcutaneously once weekly x 4 weeks ?-Then inject Ozempic 2 mg subcutaneously once weekly  ? ?Follow up with PharmD in 1 month over the phone to assess tolerability of 1mg 77me. ? ?Megan E. Supple, PharmD, BCACP, CPP ?[id number]: [phone]: [phone]/2023 4:20 PM ? ?

## 2021-08-06 ENCOUNTER — Ambulatory Visit (INDEPENDENT_AMBULATORY_CARE_PROVIDER_SITE_OTHER): Payer: 59 | Admitting: Pharmacist

## 2021-08-06 DIAGNOSIS — E669 Obesity, unspecified: Secondary | ICD-10-CM | POA: Insufficient documentation

## 2021-08-06 MED ORDER — OZEMPIC (1 MG/DOSE) 4 MG/3ML ~~LOC~~ SOPN: 1.0000 mg | PEN_INJECTOR | SUBCUTANEOUS | 0 refills | Status: DC

## 2021-08-06 NOTE — Patient Instructions (Addendum)
Ozempic Counseling Points ?This medication reduces your appetite and may make you feel fuller longer.  ?Stop eating when your body tells you that you are full. This will likely happen sooner than you are used to. ?Store your medication in the fridge until you are ready to use it. ?Inject your medication in the fatty tissue of your lower abdominal area (2 inches away from belly button) or upper outer thigh. Rotate injection sites. ?Each pen will last you about 1 month (the first month it will last a few weeks longer). Use a different needle with each weekly injection. ?Common side effects include: nausea, diarrhea/constipation, and heartburn, and are more likely to occur if you overeat. ? ?Dosing schedule: ?Continue Ozempic 0.5mg  subcutaneously once weekly for 2 more weeks ?Then inject Ozempic 1mg  subcutaneously once weekly for 4 weeks ?Then inject Ozempic 2mg  subcutaneously once weekly ? ?Will call after you have been on the 1mg  dose to see how you are tolerating the med ? ?Tips for living a healthier life ? ? ? ? ?Building a Diet ?Make most of your meal vegetables and fruits - ? of your plate. ?Aim for color and variety, and remember that potatoes don?t count as vegetables on the Healthy Eating Plate because of their negative impact on blood sugar. ? ?Go for whole grains - ? of your plate. ?Whole and intact grains--whole wheat, barley, wheat berries, quinoa, oats, brown rice, and foods made with them, such as whole wheat pasta--have a milder effect on blood sugar and insulin than white bread, white rice, and other refined grains. ? ?Protein power - ? of your plate. ?Fish, poultry, beans, and nuts are all healthy, versatile protein sources--they can be mixed into salads, and pair well with vegetables on a plate. Limit red meat, and avoid processed meats such as bacon and sausage. ? ?Healthy plant oils - in moderation. ?Choose healthy vegetable oils like olive, canola, soy, corn, sunflower,  peanut, and others, and avoid partially hydrogenated oils, which contain unhealthy trans fats. Remember that low-fat does not mean ?healthy.? ? ?Drink water, coffee, or tea. ?Skip sugary drinks, limit milk and dairy products to one to two servings per day, and limit juice to a small glass per day. ? ?Stay active. ?The red figure running across the Healthy Eating Plate?s placemat is a reminder that staying active is also important in weight control. ? ?The main message of the Healthy Eating Plate is to focus on diet quality: ? ?The type of carbohydrate in the diet is more important than the amount of carbohydrate in the diet, because some sources of carbohydrate--like vegetables (other than potatoes), fruits, whole grains, and beans--are healthier than others. ?The Healthy Eating Plate also advises consumers to avoid sugary beverages, a major source of calories--usually with little nutritional value--in the American diet. ?The Healthy Eating Plate encourages consumers to use healthy oils, and it does not set a maximum on the percentage of calories people should get each day from healthy sources of fat. In this way, the Healthy Eating Plate recommends the opposite of the low-fat message promoted for decades by the USDA. ? ? ? ?SUGAR ? ?Sugar is a huge problem in the modern day diet. Sugar is a big contributor to heart disease, diabetes, high triglyceride levels, fatty liver disease and obesity. Sugar is hidden in almost all packaged foods/beverages. Added sugar is extra sugar that is added beyond what is naturally found and has no nutritional benefit for your body. The American Heart Association  recommends limiting added sugars to no more than 25g for women and 36 grams for men per day. There are many names for sugar including maltose, sucrose (names ending in "ose"), high fructose corn syrup, molasses, cane sugar, corn sweetener, raw sugar, syrup,  honey or fruit juice concentrate.  ? ?One of the best ways to limit your added sugars is to stop drinking sweetened beverages such as soda, sweet tea, and fruit juice. ? ?There is 65g of added sugars in one 20oz bottle of Coke! That is equal to 7.5 donuts.  ? ?Pay attention and read all nutrition facts labels. Below is an examples of a nutrition facts label. The #1 is showing you the total sugars where the # 2 is showing you the added sugars. This one serving has almost the max amount of added sugars per day! ? ? ? ? ?20 oz Soda ?65g Sugar = 7.5 Glazed Donuts ? ?16oz Energy  ?Drink ?54g Sugar = 6.5 Glazed Donuts ? ?Large Sweet  ?Tea ?38g Sugar = 4 Glazed Donuts ? ?20oz Sports  ?Drink ?34g Sugar = 3.5 Glazed Donuts ? ?8oz Chocolate Milk ?24g Sugar =2.5 Glazed Donuts ? ?8oz Orange  ?Juice ?21g Sugar = 2 Glazed Donuts ? ?1 Juice Box ?14g Sugar = 1.5 Glazed Donuts ? ?16oz Water= NO SUGAR!! ? ?EXERCISE ? ?Exercise is good. We?ve all heard that. In an ideal world, we would all have time and resources to get plenty of it. When you are active, your heart pumps more efficiently and you will feel better.  Multiple studies show that even walking regularly has benefits that include living a longer life. The American Heart Association recommends 150 minutes per week of exercise (30 minutes per day most days of the week). You can do this in any increment you wish. Nine or more 10-minute walks count. So does an hour-long exercise class. Break the time apart into what will work in your life. Some of the best things you can do include walking briskly, jogging, cycling or swimming laps. Not everyone is ready to ?exercise.? Sometimes we need to start with just getting active. Here are some easy ways to be more active throughout the day: ? Take the stairs instead of the elevator ? Go for a 10-15 minute walk during your lunch break (find a friend to make it more enjoyable) ? When shopping, park at the back of the parking lot ? If you  take public transportation, get off one stop early and walk the extra distance ? Pace around while making phone calls ? ?Check with your doctor if you aren?t sure what your limitations may be. Always remember to drink plenty of water when doing any type of exercise. Don?t feel like a failure if you?re not getting the 90-150 minutes per week. If you started by being a couch potato, then just a 10-minute walk each day is a huge improvement. Start with little victories and work your way up. ? ? ?HEALTHY EATING TIPS ? ?When looking to improve your eating habits, whether to lose weight, lower blood pressure or just be healthier, it helps to know what a serving size is.  ? ?Grains ?1 slice of bread, ? bagel, ? cup pasta or rice  Vegetables ?1 cup fresh or raw vegetables, ? cup cooked or canned ?Fruits ?1 piece of medium sized fruit, ? cup canned,   Meats/Proteins ?? cup dried       1 oz meat, 1 egg, ? cup cooked beans, nuts or  seeds ? ?Dairy        Fats ?Individual yogurt container, 1 cup (8oz)    1 teaspoon margarine/butter or vegetable  ?milk or milk alternative, 1 slice of cheese          oil; 1 tablespoon mayonnaise or salad dressing                 ? ?Plan ahead: make a menu of the meals for a week then create a grocery list to go with that menu. Consider meals that easily stretch into a night of leftovers, such as stews or casseroles. Or consider making two of your favorite meal and put one in the freezer for another night. Try a night or two each week that is ?meatless? or ?no cook? such as salads. When you get home from the grocery store wash and prepare your vegetables and fruits. Then when you need them they are ready to go.  ? ?Tips for going to the grocery store: ? Hall store or generic brands ? Check the weekly ad from your store on-line or in their in-store flyer ? Look at the unit price on the shelf tag to compare/contrast the costs of different items ? Buy fruits/vegetables in season ? Carrots, bananas and  apples are low-cost, naturally healthy items ? If meats or frozen vegetables are on sale, buy some extras and put in your freezer ? Limit buying prepared or ?ready to eat? items, even if they are pre-made salads

## 2021-08-08 ENCOUNTER — Telehealth (HOSPITAL_COMMUNITY): Payer: Self-pay

## 2021-08-08 NOTE — Telephone Encounter (Signed)
Called patient to see if she was interested in participating in the Cardiac Rehab Program. Patient stated yes. Patient will come in for orientation on 08/13/21 @ 1:15PM and will attend the 3PM exercise class. ?  ?Pensions consultant.  ?

## 2021-08-09 ENCOUNTER — Telehealth (HOSPITAL_COMMUNITY): Payer: Self-pay

## 2021-08-09 DIAGNOSIS — I469 Cardiac arrest, cause unspecified: Secondary | ICD-10-CM

## 2021-08-09 NOTE — Telephone Encounter (Signed)
Successful telephone encounter to patient to confirm cardiac rehab orientation appointment for May 2 at 1:15 pm. Health history completed. Provided patient with directions and cardiac rehab phone number for any additional questions or concerns that may arise. All questions answered to patient satisfaction.  ?

## 2021-08-09 NOTE — Telephone Encounter (Signed)
Erroneous encounter

## 2021-08-13 ENCOUNTER — Telehealth: Payer: Self-pay | Admitting: Cardiology

## 2021-08-13 ENCOUNTER — Ambulatory Visit (INDEPENDENT_AMBULATORY_CARE_PROVIDER_SITE_OTHER): Payer: 59

## 2021-08-13 ENCOUNTER — Encounter (HOSPITAL_COMMUNITY): Payer: Self-pay

## 2021-08-13 ENCOUNTER — Encounter (HOSPITAL_COMMUNITY)
Admission: RE | Admit: 2021-08-13 | Discharge: 2021-08-13 | Disposition: A | Discharge status: Home / Self Care | Payer: 59 | Source: Ambulatory Visit | Attending: Cardiology | Admitting: Cardiology

## 2021-08-13 VITALS — BP 106/73 | HR 74 | Ht 63.0 in | Wt 186.9 lb

## 2021-08-13 DIAGNOSIS — I469 Cardiac arrest, cause unspecified: Secondary | ICD-10-CM

## 2021-08-13 DIAGNOSIS — I5022 Chronic systolic (congestive) heart failure: Secondary | ICD-10-CM

## 2021-08-13 LAB — CUP PACEART REMOTE DEVICE CHECK
Battery Remaining Longevity: 101 mo
Battery Remaining Percentage: 85 %
Battery Voltage: 3.01 V
Brady Statistic RV Percent Paced: 1 %
Date Time Interrogation Session: 20230502030012
HighPow Impedance: 80 Ohm
Implantable Lead Implant Date: 20211102
Implantable Lead Location: 753860
Implantable Pulse Generator Implant Date: 20211102
Lead Channel Impedance Value: 530 Ohm
Lead Channel Pacing Threshold Amplitude: 0.75 V
Lead Channel Pacing Threshold Pulse Width: 0.5 ms
Lead Channel Sensing Intrinsic Amplitude: 11.9 mV
Lead Channel Setting Pacing Amplitude: 3.5 V
Lead Channel Setting Pacing Pulse Width: 0.5 ms
Lead Channel Setting Sensing Sensitivity: 0.5 mV
Pulse Gen Serial Number: 111028702

## 2021-08-13 NOTE — Progress Notes (Signed)
Cardiac Rehab Medication Review by a Nurse ? ?Does the patient  feel that his/her medications are working for him/her?  yes ? ?Has the patient been experiencing any side effects to the medications prescribed?  no ? ?Does the patient measure his/her own blood pressure or blood glucose at home?  no  ? ?Does the patient have any problems obtaining medications due to transportation or finances?   no ? ?Understanding of regimen: good ?Understanding of indications: good ?Potential of compliance: good ? ? ? ?Nurse comments: Kristina Nichols is taking her medications as prescribed and has a good understanding of what her medications are for. Kristina Nichols has a wrist BP cuff but does not take her BP on a regular basis as she thinks it does not give her an accurate reading. ? ? ? ?Kristina Headings RN ?08/13/2021 3:04 PM ?  ?

## 2021-08-13 NOTE — Telephone Encounter (Signed)
Pt presented for orientation of cardiac rehab and on strips her HR was drop and she would have V pacing.  She has ICD, the pacing was slow due to used for back up.  No lightheadedness or dizziness. I cleared her to continue with rehab.  Am sending this note to Dr. Aundra Dubin.  Rehab was sending the strips to him as well.    ?

## 2021-08-13 NOTE — Progress Notes (Signed)
Cardiac Individual Treatment Plan ? ?Patient Details  ?Name: Kristina Nichols ?MRN: [id number] ?Date of Birth: [birth year] ?Referring Provider:   ?Flowsheet Row CARDIAC REHAB PHASE II ORIENTATION from 08/13/2021 in [facility] CARDIAC REHAB  ?Referring Provider Marca Ancona, MD  ? ?  ? ? ?Initial Encounter Date:  ?Flowsheet Row CARDIAC REHAB PHASE II ORIENTATION from 08/13/2021 in [facility] CARDIAC REHAB  ?Date 08/13/21  ? ?  ? ? ?Visit Diagnosis: Heart failure, chronic systolic (HCC) ? ?Patient's Home Medications on Admission: ? ?Current Outpatient Medications:  ?  acetaminophen (TYLENOL) 500 MG tablet, Take 1,000 mg by mouth every 6 (six) hours as needed for moderate pain., Disp: , Rfl:  ?  buPROPion (WELLBUTRIN XL) 300 MG 24 hr tablet, Take 300 mg by mouth in the morning., Disp: , Rfl:  ?  carvedilol (COREG) 6.25 MG tablet, TAKE 1 TABLET BY MOUTH 2 TIMES DAILY WITH A MEAL., Disp: 180 tablet, Rfl: 3 ?  clonazePAM (KLONOPIN) 1 MG tablet, Take 1 mg by mouth 3 (three) times daily as needed for anxiety., Disp: , Rfl:  ?  dapagliflozin propanediol (FARXIGA) 10 MG TABS tablet, Take 1 tablet (10 mg total) by mouth daily before breakfast., Disp: 90 tablet, Rfl: 3 ?  lamoTRIgine (LAMICTAL) 100 MG tablet, Take 100 mg by mouth in the morning., Disp: , Rfl:  ?  lamoTRIgine (LAMICTAL) 150 MG tablet, Take 150 mg by mouth at bedtime., Disp: , Rfl:  ?  losartan (COZAAR) 25 MG tablet, Take 1 tablet (25 mg total) by mouth daily., Disp: 90 tablet, Rfl: 3 ?  omeprazole (PRILOSEC OTC) 20 MG tablet, Take 20 mg by mouth daily as needed (for heartburn or indigestion)., Disp: , Rfl:  ?  pregabalin (LYRICA) 100 MG capsule, Take 100 mg by mouth daily., Disp: , Rfl:  ?  Semaglutide, 1 MG/DOSE, (OZEMPIC, 1 MG/DOSE,) 4 MG/3ML SOPN, Inject 1 mg into the skin once a week., Disp: 3 mL, Rfl: 0 ?  spironolactone (ALDACTONE) 25 MG tablet, TAKE 1 TABLET (25 MG TOTAL) BY MOUTH DAILY., Disp: 90 tablet, Rfl: 3 ? ?Past Medical  History: ?Past Medical History:  ?Diagnosis Date  ? Anxiety   ? CHF (congestive heart failure) (HCC)   ? Depression   ? Hyperlipidemia   ? Hypothyroidism   ? Insomnia   ? Scoliosis   ? ? ?Tobacco Use: ?Social History  ? ?Tobacco Use  ?Smoking Status Never  ?Smokeless Tobacco Never  ? ? ?Labs: ?Review Flowsheet   ? ?  ?  Latest Ref Rng & Units 02/07/2020 02/08/2020 02/09/2020 02/10/2020  ?Labs for ITP Cardiac and Pulmonary Rehab  ?Cholestrol 0 - 200 mg/dL      ?LDL (calc) 0 - 99 mg/dL      ?HDL-C >40 mg/dL      ?Trlycerides <150 mg/dL  85      ?O2 Saturation % 75.9   82.3   86.1   81.2    ? ?  06/18/2021  ?Labs for ITP Cardiac and Pulmonary Rehab  ?Cholestrol 234    ?LDL (calc) 150    ?HDL-C 72    ?Trlycerides 58    ?O2 Saturation   ?  ? ? Multiple values from one day are sorted in reverse-chronological order  ?  ?  ? ? ?Capillary Blood Glucose: ?Lab Results  ?Component Value Date  ? GLUCAP 111 (H) 02/11/2020  ? GLUCAP 97 02/11/2020  ? GLUCAP 104 (H) 02/11/2020  ? GLUCAP 84 02/11/2020  ?  GLUCAP 96 02/11/2020  ? ? ? ?Exercise Target Goals: ?Exercise Program Goal: ?Individual exercise prescription set using results from initial 6 min walk test and THRR while considering  patient?s activity barriers and safety.  ? ?Exercise Prescription Goal: ?Starting with aerobic activity 30 plus minutes a day, 3 days per week for initial exercise prescription. Provide home exercise prescription and guidelines that participant acknowledges understanding prior to discharge. ? ?Activity Barriers & Risk Stratification: ? Activity Barriers & Cardiac Risk Stratification - 08/13/21 1610   ? ?  ? Activity Barriers & Cardiac Risk Stratification  ? Activity Barriers Back Problems;Joint Problems;Neck/Spine Problems   ? Cardiac Risk Stratification High   ? ?  ?  ? ?  ? ? ?6 Minute Walk: ? 6 Minute Walk   ? ? Row Name 08/13/21 1453  ?  ?  ?  ? 6 Minute Walk  ? Phase Initial    ? Distance 1302 feet    ? Walk Time 6 minutes    ? # of Rest Breaks 0     ? MPH 2.5    ? METS 3.32    ? RPE 7    ? Perceived Dyspnea  0    ? VO2 Peak 11.6    ? Symptoms No    ? Resting HR 87 bpm    ? Resting BP 106/73    ? Resting Oxygen Saturation  95 %    ? Exercise Oxygen Saturation  during 6 min walk 97 %    ? Max Ex. HR 97 bpm    ? Max Ex. BP 114/79    ? 2 Minute Post BP 94/59    ? ?  ?  ? ?  ? ? ?Oxygen Initial Assessment: ? ? ?Oxygen Re-Evaluation: ? ? ?Oxygen Discharge (Final Oxygen Re-Evaluation): ? ? ?Initial Exercise Prescription: ? Initial Exercise Prescription - 08/13/21 1600   ? ?  ? Date of Initial Exercise RX and Referring Provider  ? Date 08/13/21   ? Referring Provider Marca Ancona, MD   ? Expected Discharge Date 10/11/21   ?  ? NuStep  ? Level 2   ? SPM 75   ? Minutes 15   ? METs 2.4   ?  ? Track  ? Laps 12   ? Minutes 15   ? METs 2.39   ?  ? Prescription Details  ? Frequency (times per week) 3   ? Duration Progress to 30 minutes of continuous aerobic without signs/symptoms of physical distress   ?  ? Intensity  ? THRR 40-80% of Max Heartrate 67-134   ? Ratings of Perceived Exertion 11-13   ? Perceived Dyspnea 0-4   ?  ? Progression  ? Progression Continue progressive overload as per policy without signs/symptoms or physical distress.   ?  ? Resistance Training  ? Training Prescription Yes   ? Weight 2 lbs   ? Reps 10-15   ? ?  ?  ? ?  ? ? ?Perform Capillary Blood Glucose checks as needed. ? ?Exercise Prescription Changes: ? ? ?Exercise Comments: ? ? ?Exercise Goals and Review: ? ? Exercise Goals   ? ? Row Name 08/13/21 1359  ?  ?  ?  ?  ?  ? Exercise Goals  ? Increase Physical Activity Yes      ? Intervention Provide advice, education, support and counseling about physical activity/exercise needs.;Develop an individualized exercise prescription for aerobic and resistive training based on initial evaluation findings, risk stratification,  comorbidities and participant's personal goals.      ? Expected Outcomes Short Term: Attend rehab on a regular basis to increase  amount of physical activity.;Long Term: Exercising regularly at least 3-5 days a week.;Long Term: Add in home exercise to make exercise part of routine and to increase amount of physical activity.      ? Increase Strength and Stamina Yes      ? Intervention Provide advice, education, support and counseling about physical activity/exercise needs.;Develop an individualized exercise prescription for aerobic and resistive training based on initial evaluation findings, risk stratification, comorbidities and participant's personal goals.      ? Expected Outcomes Short Term: Increase workloads from initial exercise prescription for resistance, speed, and METs.;Short Term: Perform resistance training exercises routinely during rehab and add in resistance training at home;Long Term: Improve cardiorespiratory fitness, muscular endurance and strength as measured by increased METs and functional capacity ( )      ? Able to understand and use rate of perceived exertion (RPE) scale Yes      ? Intervention Provide education and explanation on how to use RPE scale      ? Expected Outcomes Short Term: Able to use RPE daily in rehab to express subjective intensity level;Long Term:  Able to use RPE to guide intensity level when exercising independently      ? Knowledge and understanding of Target Heart Rate Range (THRR) Yes      ? Intervention Provide education and explanation of THRR including how the numbers were predicted and where they are located for reference      ? Expected Outcomes Short Term: Able to state/look up THRR;Long Term: Able to use THRR to govern intensity when exercising independently;Short Term: Able to use daily as guideline for intensity in rehab      ? Able to check pulse independently Yes      ? Intervention Provide education and demonstration on how to check pulse in carotid and radial arteries.;Review the importance of being able to check your own pulse for safety during independent exercise      ?  Expected Outcomes Short Term: Able to explain why pulse checking is important during independent exercise;Long Term: Able to check pulse independently and accurately      ? Understanding of Exercise Prescripti

## 2021-08-13 NOTE — Telephone Encounter (Signed)
Please get device check to assess for RV pacing percentage.  ?

## 2021-08-14 NOTE — Telephone Encounter (Signed)
Transmission reviewed per Dr. Shirlee Latch request. RV pacing <1%. Of note patient is overdue for manual in clinic interrogation and appointment with Dr. Lalla Brothers as she has had several no shows, last being 07/03/21 with Almyra Free, PA. Patient has only been seen for wound check post implant and never returned for 91 day follow up. RV output still set at 3.5V. Routing to Dr. Shirlee Latch as patient is schedule to begin participating in Cardiac Rehab Monday May 8th and V pacing at 40 was noted during Cardiac Rehab orientation. V-V intervals irregular on transmission. Strongly advise patient have manual testing an follow up to ensure device is functioning properly. BB noted on medication profile. No history of Atrial arhythmia. Routing to EP scheduling for follow up appointment with CL or EP APP.   ? ? ? ? ? ? ? ? ? ? ? ?

## 2021-08-14 NOTE — Telephone Encounter (Signed)
Transmission received 08/13/2021 ?

## 2021-08-14 NOTE — Telephone Encounter (Signed)
Spoke with pts husband he will have pt send in transmission.  ?

## 2021-08-14 NOTE — Telephone Encounter (Signed)
Message forwarded to device clinic for f/u ?

## 2021-08-16 ENCOUNTER — Telehealth: Payer: Self-pay

## 2021-08-16 NOTE — Telephone Encounter (Signed)
Successful telephone encounter to patient to follow up on recently request remote transmission to assess for RV pacing % per Dr. Aundra Dubin. Thanked patient for transmission and reassured her that her ICD was functioning properly however when at rest she was occasionally pacing at her LRL of 40 bpm. Also informed patient that she has been cleared to start Cardiac Rehab on Monday per Dr. Aundra Dubin however she needs to follow up with EP for manual interrogation and to decrease RV output to chronic lead settings. Patient is advised to call clinic if she cannot make appointment as she has had multiple "no shows" with EP providers post implant. Patient verbalizes understanding that she will be contacted next week by EP scheduling for follow up appointment with EP APP or Dr.Lambert. Patient appreciative of call.  ?

## 2021-08-19 ENCOUNTER — Encounter (HOSPITAL_COMMUNITY)
Admission: RE | Admit: 2021-08-19 | Discharge: 2021-08-19 | Disposition: A | Discharge status: Home / Self Care | Payer: 59 | Source: Ambulatory Visit | Attending: Cardiology | Admitting: Cardiology

## 2021-08-19 DIAGNOSIS — I5022 Chronic systolic (congestive) heart failure: Secondary | ICD-10-CM

## 2021-08-19 NOTE — Progress Notes (Signed)
Cardiac Individual Treatment Plan ? ?Patient Details  ?Name: Kristina Nichols ?MRN: [id number] ?Date of Birth: [birth year] ?Referring Provider:   ?Flowsheet Row CARDIAC REHAB PHASE II ORIENTATION from 08/13/2021 in [facility] CARDIAC REHAB  ?Referring Provider Marca Ancona, MD  ? ?  ? ? ?Initial Encounter Date:  ?Flowsheet Row CARDIAC REHAB PHASE II ORIENTATION from 08/13/2021 in [facility] CARDIAC REHAB  ?Date 08/13/21  ? ?  ? ? ?Visit Diagnosis: Heart failure, chronic systolic (HCC) ? ?Patient's Home Medications on Admission: ? ?Current Outpatient Medications:  ?  acetaminophen (TYLENOL) 500 MG tablet, Take 1,000 mg by mouth every 6 (six) hours as needed for moderate pain., Disp: , Rfl:  ?  buPROPion (WELLBUTRIN XL) 300 MG 24 hr tablet, Take 300 mg by mouth in the morning., Disp: , Rfl:  ?  carvedilol (COREG) 6.25 MG tablet, TAKE 1 TABLET BY MOUTH 2 TIMES DAILY WITH A MEAL., Disp: 180 tablet, Rfl: 3 ?  clonazePAM (KLONOPIN) 1 MG tablet, Take 1 mg by mouth 3 (three) times daily as needed for anxiety., Disp: , Rfl:  ?  dapagliflozin propanediol (FARXIGA) 10 MG TABS tablet, Take 1 tablet (10 mg total) by mouth daily before breakfast., Disp: 90 tablet, Rfl: 3 ?  lamoTRIgine (LAMICTAL) 100 MG tablet, Take 100 mg by mouth in the morning., Disp: , Rfl:  ?  lamoTRIgine (LAMICTAL) 150 MG tablet, Take 150 mg by mouth at bedtime., Disp: , Rfl:  ?  losartan (COZAAR) 25 MG tablet, Take 1 tablet (25 mg total) by mouth daily., Disp: 90 tablet, Rfl: 3 ?  omeprazole (PRILOSEC OTC) 20 MG tablet, Take 20 mg by mouth daily as needed (for heartburn or indigestion)., Disp: , Rfl:  ?  pregabalin (LYRICA) 100 MG capsule, Take 100 mg by mouth daily., Disp: , Rfl:  ?  Semaglutide, 1 MG/DOSE, (OZEMPIC, 1 MG/DOSE,) 4 MG/3ML SOPN, Inject 1 mg into the skin once a week., Disp: 3 mL, Rfl: 0 ?  spironolactone (ALDACTONE) 25 MG tablet, TAKE 1 TABLET (25 MG TOTAL) BY MOUTH DAILY., Disp: 90 tablet, Rfl: 3 ? ?Past Medical  History: ?Past Medical History:  ?Diagnosis Date  ? Anxiety   ? CHF (congestive heart failure) (HCC)   ? Depression   ? Hyperlipidemia   ? Hypothyroidism   ? Insomnia   ? Scoliosis   ? ? ?Tobacco Use: ?Social History  ? ?Tobacco Use  ?Smoking Status Never  ?Smokeless Tobacco Never  ? ? ?Labs: ?Review Flowsheet   ? ?  ?  Latest Ref Rng & Units 02/07/2020 02/08/2020 02/09/2020 02/10/2020  ?Labs for ITP Cardiac and Pulmonary Rehab  ?Cholestrol 0 - 200 mg/dL      ?LDL (calc) 0 - 99 mg/dL      ?HDL-C >40 mg/dL      ?Trlycerides <150 mg/dL  85      ?O2 Saturation % 75.9   82.3   86.1   81.2    ? ?  06/18/2021  ?Labs for ITP Cardiac and Pulmonary Rehab  ?Cholestrol 234    ?LDL (calc) 150    ?HDL-C 72    ?Trlycerides 58    ?O2 Saturation   ?  ? ? Multiple values from one day are sorted in reverse-chronological order  ?  ?  ? ? ?Capillary Blood Glucose: ?Lab Results  ?Component Value Date  ? GLUCAP 111 (H) 02/11/2020  ? GLUCAP 97 02/11/2020  ? GLUCAP 104 (H) 02/11/2020  ? GLUCAP 84 02/11/2020  ?  GLUCAP 96 02/11/2020  ? ? ? ?Exercise Target Goals: ?Exercise Program Goal: ?Individual exercise prescription set using results from initial 6 min walk test and THRR while considering  patient?s activity barriers and safety.  ? ?Exercise Prescription Goal: ?Starting with aerobic activity 30 plus minutes a day, 3 days per week for initial exercise prescription. Provide home exercise prescription and guidelines that participant acknowledges understanding prior to discharge. ? ?Activity Barriers & Risk Stratification: ? Activity Barriers & Cardiac Risk Stratification - 08/13/21 1610   ? ?  ? Activity Barriers & Cardiac Risk Stratification  ? Activity Barriers Back Problems;Joint Problems;Neck/Spine Problems   ? Cardiac Risk Stratification High   ? ?  ?  ? ?  ? ? ?6 Minute Walk: ? 6 Minute Walk   ? ? Row Name 08/13/21 1453  ?  ?  ?  ? 6 Minute Walk  ? Phase Initial    ? Distance 1302 feet    ? Walk Time 6 minutes    ? # of Rest Breaks 0     ? MPH 2.5    ? METS 3.32    ? RPE 7    ? Perceived Dyspnea  0    ? VO2 Peak 11.6    ? Symptoms No    ? Resting HR 87 bpm    ? Resting BP 106/73    ? Resting Oxygen Saturation  95 %    ? Exercise Oxygen Saturation  during 6 min walk 97 %    ? Max Ex. HR 97 bpm    ? Max Ex. BP 114/79    ? 2 Minute Post BP 94/59    ? ?  ?  ? ?  ? ? ?Oxygen Initial Assessment: ? ? ?Oxygen Re-Evaluation: ? ? ?Oxygen Discharge (Final Oxygen Re-Evaluation): ? ? ?Initial Exercise Prescription: ? Initial Exercise Prescription - 08/13/21 1600   ? ?  ? Date of Initial Exercise RX and Referring Provider  ? Date 08/13/21   ? Referring Provider [name], MD   ? Expected Discharge Date 10/11/21   ?  ? NuStep  ? Level 2   ? SPM 75   ? Minutes 15   ? METs 2.4   ?  ? Track  ? Laps 12   ? Minutes 15   ? METs 2.39   ?  ? Prescription Details  ? Frequency (times per week) 3   ? Duration Progress to 30 minutes of continuous aerobic without signs/symptoms of physical distress   ?  ? Intensity  ? THRR 40-80% of Max Heartrate 67-134   ? Ratings of Perceived Exertion 11-13   ? Perceived Dyspnea 0-4   ?  ? Progression  ? Progression Continue progressive overload as per policy without signs/symptoms or physical distress.   ?  ? Resistance Training  ? Training Prescription Yes   ? Weight 2 lbs   ? Reps 10-15   ? ?  ?  ? ?  ? ? ?Perform Capillary Blood Glucose checks as needed. ? ?Exercise Prescription Changes: ? ? ?Exercise Comments: ? ? ?Exercise Goals and Review: ? ? Exercise Goals   ? ? Row Name 08/13/21 1359  ?  ?  ?  ?  ?  ? Exercise Goals  ? Increase Physical Activity Yes      ? Intervention Provide advice, education, support and counseling about physical activity/exercise needs.;Develop an individualized exercise prescription for aerobic and resistive training based on initial evaluation findings, risk stratification,   comorbidities and participant's personal goals.      ? Expected Outcomes Short Term: Attend rehab on a regular basis to increase  amount of physical activity.;Long Term: Exercising regularly at least 3-5 days a week.;Long Term: Add in home exercise to make exercise part of routine and to increase amount of physical activity.      ? Increase Strength and Stamina Yes      ? Intervention Provide advice, education, support and counseling about physical activity/exercise needs.;Develop an individualized exercise prescription for aerobic and resistive training based on initial evaluation findings, risk stratification, comorbidities and participant's personal goals.      ? Expected Outcomes Short Term: Increase workloads from initial exercise prescription for resistance, speed, and METs.;Short Term: Perform resistance training exercises routinely during rehab and add in resistance training at home;Long Term: Improve cardiorespiratory fitness, muscular endurance and strength as measured by increased METs and functional capacity ( )      ? Able to understand and use rate of perceived exertion (RPE) scale Yes      ? Intervention Provide education and explanation on how to use RPE scale      ? Expected Outcomes Short Term: Able to use RPE daily in rehab to express subjective intensity level;Long Term:  Able to use RPE to guide intensity level when exercising independently      ? Knowledge and understanding of Target Heart Rate Range (THRR) Yes      ? Intervention Provide education and explanation of THRR including how the numbers were predicted and where they are located for reference      ? Expected Outcomes Short Term: Able to state/look up THRR;Long Term: Able to use THRR to govern intensity when exercising independently;Short Term: Able to use daily as guideline for intensity in rehab      ? Able to check pulse independently Yes      ? Intervention Provide education and demonstration on how to check pulse in carotid and radial arteries.;Review the importance of being able to check your own pulse for safety during independent exercise      ?  Expected Outcomes Short Term: Able to explain why pulse checking is important during independent exercise;Long Term: Able to check pulse independently and accurately      ? Understanding of Exercise Prescripti

## 2021-08-19 NOTE — Progress Notes (Signed)
Incomplete Session Note ? ?Patient Details  ?Name: Kristina Nichols ?MRN: [id number] ?Date of Birth: 20-Jan-1966 ?Referring Provider:   ?Flowsheet Row CARDIAC REHAB PHASE II ORIENTATION from 08/13/2021 in [facility]  ?Referring Provider Loralie Champagne, MD  ? ?  ? ? ?Kristina Nichols did not complete her rehab session.  Will hold off on [name] until after she has had her EP follow up. Patient agreeable to the plan. [name] says that she will be out of town for the rest of the week. Patient was counseled by the RD Sam today.Barnet Pall, RN,BSN ?08/19/2021 3:23 PM  ?

## 2021-08-20 NOTE — Progress Notes (Signed)
Kristina Nichols 56 y.o. female ?Nutrition Note ?Teianna is motivated to make lifestyle changes to aid with cardiac rehab. Patient has medical history of arrhythmia, cardiac arrest, acute systolic heart failure, respiratory failure, obesity. She lives at home with her husband, Gerald Stabs, and son. Her husband does the majority of the grocery shopping and cooking. She reports lengthy dieting history including most recently the keto diet and tracking calories. She is taking Ozempic for weight loss. Some difficulty ascertaining dieting history due to tangential thoughts.  She is interested in working with a behavioral health counselor;in addition, she does not currently seem to have a PCP for regular follow-up care. She does report regularly missed appointments.  ? ?Labs: LDL cholesterol 150, cholesterol 234 ? ?RMR: 1426 ? ?24 Hour Recall:  ?Breakfast: coffee with bliss almond vanilla creamer, 2-3 tsp sugar + 1.5 cups blueberries and strawberries ?Lunch: steel cut oats with water, cinnamon, honey, mixed nuts ?Dinner: chicken/salmon/ shrimp OR chinese food  ?Snack: nuts, fruit ? ?Nutrition Diagnosis ?Obesity related to excessive energy intake as evidenced by a 33.12 ? ? ?Nutrition Intervention ?Pt?s individual nutrition plan reviewed with pt. ?Benefits of adopting Heart Healthy diet discussed.  ?Continue client-centered nutrition education by RD, as part of interdisciplinary care. ? ?Monitor/Evaluation: ?Patient reports motivation to make lifestyle changes for adherence to heart healthy diet recommendation and weight management. We discussed using the plate method as a guide for meal planning, vegetable intake, whole grains/complex carbohydrates, lean protein/plant protein sources. Handouts/notes given (plate method, quick [id number] ways to improve heart health) . Patient amicable to RD suggestions and verbalizes understanding. Will follow-up as needed. Recommend and encouraged follow-up with PCP and behavioral health counseling.   ? ?30 minutes spent in review of topics related to a heart healthy diet including sodium intake, blood sugar control, weight management, and fiber intake. ? ?Goal(s) ?Pt to identify and limit food sources of saturated fat, trans fat, refined carbohydrates and sodium ?Pt to identify food quantities necessary to achieve weight loss of 6-24 lb at graduation from cardiac rehab.  ?Pt able to name foods that affect blood glucose. Continue to limit simple sugars, refined carbohydrates, sugary beverages, etc.  ?Pt to describe the benefit of including lean protein/plant proteins, fruits, vegetables, whole grains, nuts/seeds, and low-fat dairy products in a heart healthy meal plan. ?Pt to practice mindful and intuitive eating exercises ? ?Plan:  ?Will provide client-centered nutrition education as part of interdisciplinary care ?Monitor and evaluate progress toward nutrition goal with team. ? ? ?Aldona Bar [place], MS, RDN, LDN ? ?

## 2021-08-21 ENCOUNTER — Encounter (HOSPITAL_COMMUNITY): Payer: 59

## 2021-08-23 ENCOUNTER — Encounter (HOSPITAL_COMMUNITY): Payer: 59

## 2021-08-26 ENCOUNTER — Encounter (HOSPITAL_COMMUNITY): Payer: 59

## 2021-08-26 ENCOUNTER — Encounter: Payer: Self-pay | Admitting: Physician Assistant

## 2021-08-26 ENCOUNTER — Ambulatory Visit (INDEPENDENT_AMBULATORY_CARE_PROVIDER_SITE_OTHER): Payer: 59 | Admitting: Physician Assistant

## 2021-08-26 VITALS — BP 100/60 | HR 74 | Ht 63.0 in | Wt 181.8 lb

## 2021-08-26 DIAGNOSIS — I5181 Takotsubo syndrome: Secondary | ICD-10-CM | POA: Diagnosis not present

## 2021-08-26 DIAGNOSIS — Z95810 Presence of automatic (implantable) cardiac defibrillator: Secondary | ICD-10-CM | POA: Diagnosis not present

## 2021-08-26 DIAGNOSIS — I5022 Chronic systolic (congestive) heart failure: Secondary | ICD-10-CM

## 2021-08-26 DIAGNOSIS — I5021 Acute systolic (congestive) heart failure: Secondary | ICD-10-CM

## 2021-08-26 DIAGNOSIS — I469 Cardiac arrest, cause unspecified: Secondary | ICD-10-CM

## 2021-08-26 NOTE — Progress Notes (Signed)
? ?Cardiology Office Note ?Date:  08/26/2021  ?Patient ID:  Kristina Nichols, DOB December 19, 1965, MRN [id number] ?PCP:  Patient, No Pcp Per (Inactive)  ?Cardiologist:  Dr. Aundra Dubin ?Electrophysiologist: Dr. Lovena Le ? ?  ?Chief Complaint: over due ICD check ? ?History of Present Illness: ?Kristina Nichols is a 56 y.o. female with history of anxiety/depression, cardiac arrest ? ?01/2020 admission, she had been under a lot of stress. Witnessed cardiac arrest at home (CPR 7 min) with refractory vib/ torsades. Had cardioversion at home followed by transport to [facility]. Intubated in ER.  Cardiac cath showed normal coronaries. LV gram EF 20% concerning for Takotsubo cardiomyopathy. Required multiple cardioversions post cath for recurrent torsades. On amio drip initially but had prolonged QTc so amiodarone was stopped. Lidocaine then started. Had recurrent torsades again and received another shock. EP consulted.  St Jude ICD placed. Started on mexiletine. ?Had cMRI prior to d/c which showed interval improvement in LVEF up to 56% suggestive of recovery of Takotsubo cardiomyopathy. ? ?EF has remained recovered, following with the HF team ?Mexiletine along the way stopped ?Recently started cardiac rehab ? ?She feels well, thinks with her cardiac arrest she had a personality change, likes different things that she used to not like/enjoy and visa-versa ?NO CP, palpitations, SOB, no syncope, no shocks ?Recovery of her exertional capacity has been slow with some worries is seems about provkoing anything./arrhythmias. ?Mentions her husband quite affected by the whole thing and is in therapy.  ? ? ?Device information ?Abbott single chamber ICD implanted 02/14/2020 ?Secondary prevention ? ? ?Past Medical History:  ?Diagnosis Date  ? Anxiety   ? CHF (congestive heart failure) ([place])   ? Depression   ? Hyperlipidemia   ? Hypothyroidism   ? Insomnia   ? Scoliosis   ? ? ?Past Surgical History:  ?Procedure Laterality Date  ? ABDOMINAL HYSTERECTOMY    ?  CARDIAC CATHETERIZATION    ? CHOLECYSTECTOMY    ? IABP INSERTION N/A 02/06/2020  ? Procedure: IABP Insertion;  Surgeon: Larey Dresser, MD;  Location: [facility];  Service: Cardiovascular;  Laterality: N/A;  ? ICD IMPLANT N/A 02/14/2020  ? Procedure: ICD IMPLANT;  Surgeon: Evans Lance, MD;  Location: [facility];  Service: Cardiovascular;  Laterality: N/A;  ? LEFT HEART CATH AND CORONARY ANGIOGRAPHY N/A 02/05/2020  ? Procedure: LEFT HEART CATH AND CORONARY ANGIOGRAPHY;  Surgeon: Burnell Blanks, MD;  Location: [place];  Service: Cardiovascular;  Laterality: N/A;  ? PARTIAL HYSTERECTOMY    ? RIGHT HEART CATH N/A 02/06/2020  ? Procedure: RIGHT HEART CATH;  Surgeon: Larey Dresser, MD;  Location: [place];  Service: Cardiovascular;  Laterality: N/A;  ? TEMPORARY PACEMAKER N/A 02/05/2020  ? Procedure: TEMPORARY PACEMAKER;  Surgeon: Burnell Blanks, MD;  Location: [place] [facility];  Service: Cardiovascular;  Laterality: N/A;  ? ? ?Current Outpatient Medications  ?Medication Sig Dispense Refill  ? acetaminophen (TYLENOL) 500 MG tablet Take 1,000 mg by mouth every 6 (six) hours as needed for moderate pain.    ? buPROPion (WELLBUTRIN XL) 300 MG 24 hr tablet Take 300 mg by mouth in the morning.    ? carvedilol (COREG) 6.25 MG tablet TAKE 1 TABLET BY MOUTH 2 TIMES DAILY WITH A MEAL. 180 tablet 3  ? clonazePAM (KLONOPIN) 1 MG tablet Take 1 mg by mouth 3 (three) times daily as needed for anxiety.    ? dapagliflozin propanediol (FARXIGA) 10 MG TABS tablet Take  1 tablet (10 mg total) by mouth daily before breakfast. 90 tablet 3  ? lamoTRIgine (LAMICTAL) 100 MG tablet Take 100 mg by mouth in the morning.    ? lamoTRIgine (LAMICTAL) 150 MG tablet Take 150 mg by mouth at bedtime.    ? losartan (COZAAR) 25 MG tablet Take 1 tablet (25 mg total) by mouth daily. 90 tablet 3  ? omeprazole (PRILOSEC OTC) 20 MG tablet Take 20 mg by mouth daily as needed (for heartburn or  indigestion).    ? pregabalin (LYRICA) 100 MG capsule Take 100 mg by mouth daily.    ? Semaglutide, 1 MG/DOSE, (OZEMPIC, 1 MG/DOSE,) 4 MG/3ML SOPN Inject 1 mg into the skin once a week. 3 mL 0  ? spironolactone (ALDACTONE) 25 MG tablet TAKE 1 TABLET (25 MG TOTAL) BY MOUTH DAILY. 90 tablet 3  ? ?No current facility-administered medications for this visit.  ? ? ?Allergies:   Patient has no known allergies.  ? ?Social History:  The patient  reports that she has never smoked. She has never used smokeless tobacco. She reports that she does not currently use alcohol. She reports that she does not use drugs.  ? ?Family History:  The patient's family history includes Alzheimer's disease in her father; Chronic granulomatous disease in her son. ? ?ROS:  Please see the history of present illness.    ?All other systems are reviewed and otherwise negative.  ? ?PHYSICAL EXAM:  ?VS:  BP 100/60   Pulse 74   Ht 5\' 3"  (1.6 m)   Wt 181 lb 12.8 oz (82.5 kg)   SpO2 96%   BMI 32.20 kg/m?  BMI: Body mass index is 32.2 kg/m?. ?Well nourished, well developed, in no acute distress ?HEENT: normocephalic, atraumatic ?Neck: no JVD, carotid bruits or masses ?Cardiac:  RRR; no significant murmurs, no rubs, or gallops ?Lungs:  CTA b/l, no wheezing, rhonchi or rales ?Abd: soft, nontender ?MS: no deformity or atrophy ?Ext: no edema ?Skin: warm and dry, no rash ?Neuro:  No gross deficits appreciated ?Psych: euthymic mood, full affect ? ?ICD site is stable, no tethering or discomfort ? ? ?EKG:  not done today ? ?Device interrogation done today and reviewed by myself:  ?Battery and lead measurements are good ?No arrhythmias ?Lead output changed to 2.5V ? ?Recent Labs: ?06/18/2021: B Natriuretic Peptide 80.1; BUN 15; Creatinine, Ser 0.94; Potassium 4.3; Sodium 140  ?06/18/2021: Cholesterol 234; HDL 72; LDL Cholesterol 150; Total CHOL/HDL Ratio 3.3; Triglycerides 58; VLDL 12  ? ?CrCl cannot be calculated (Patient's most recent lab result is older than  the maximum 21 days allowed.).  ? ?Wt Readings from Last 3 Encounters:  ?08/26/21 181 lb 12.8 oz (82.5 kg)  ?08/13/21 186 lb 15.2 oz (84.8 kg)  ?06/18/21 198 lb (89.8 kg)  ?  ? ?Other studies reviewed: ?Additional studies/records reviewed today include: summarized above ? ?ASSESSMENT AND PLAN: ? ?ICD ?Intact function ?Programming as abbove ? ?Cardiac arrest ?Takotsubo-type CM ?No CAD ?Recovered LVEF ?Follows closely with the HF team ? ? ?Disposition: F/u with remotes as usual, in clinic in 1 year sooner if needed ? ?Current medicines are reviewed at length with the patient today.  The patient did not have any concerns regarding medicines. ? ?Signed, ?Tommye Standard, PA-C ?08/26/2021 4:43 PM    ? ?[facility] ?[address] ?[address] ?[place] [address] ?[phone] (office)  ?[phone] (fax) ? ? ?

## 2021-08-26 NOTE — Patient Instructions (Signed)
Medication Instructions:  ? ?Your physician recommends that you continue on your current medications as directed. Please refer to the Current Medication list given to you today. ? ?*If you need a refill on your cardiac medications before your next appointment, please call your pharmacy* ? ? ?Lab Work: NONE ORDERED  TODAY ? ? ? ?If you have labs (blood work) drawn today and your tests are completely normal, you will receive your results only by: ?MyChart Message (if you have MyChart) OR ?A paper copy in the mail ?If you have any lab test that is abnormal or we need to change your treatment, we will call you to review the results. ? ? ?Testing/Procedures: NONE ORDERED  TODAY ? ? ? ?Follow-Up: ?At [facility], you and your health needs are our priority.  As part of our continuing mission to provide you with exceptional heart care, we have created designated Provider Care Teams.  These Care Teams include your primary Cardiologist (physician) and Advanced Practice Providers (APPs -  Physician Assistants and Nurse Practitioners) who all work together to provide you with the care you need, when you need it. ? ?We recommend signing up for the patient portal called "MyChart".  Sign up information is provided on this After Visit Summary.  MyChart is used to connect with patients for Virtual Visits (Telemedicine).  Patients are able to view lab/test results, encounter notes, upcoming appointments, etc.  Non-urgent messages can be sent to your provider as well.   ?To learn more about what you can do with MyChart, go to [email or web address].   ? ?Your next appointment:   ?1 year(s) ? ?The format for your next appointment:   ?In Person ? ?Provider:   ?You may see Lewayne Bunting, MD or one of the following Advanced Practice Providers on your designated Care Team:   ?Francis Dowse, PA-C ?Casimiro Needle "Mardelle Matte" [place], PA-C  ? ? ?Other Instructions ? ? ?Important Information About Sugar ? ? ? ? ?  ?

## 2021-08-28 ENCOUNTER — Encounter (HOSPITAL_COMMUNITY): Payer: 59

## 2021-08-28 NOTE — Progress Notes (Signed)
Remote ICD transmission.   

## 2021-08-30 ENCOUNTER — Encounter (HOSPITAL_COMMUNITY): Payer: 59

## 2021-09-02 ENCOUNTER — Encounter (HOSPITAL_COMMUNITY): Payer: 59

## 2021-09-03 ENCOUNTER — Encounter: Payer: 59 | Admitting: Student

## 2021-09-04 ENCOUNTER — Encounter (HOSPITAL_COMMUNITY)
Admission: RE | Admit: 2021-09-04 | Discharge: 2021-09-04 | Disposition: A | Discharge status: Home / Self Care | Payer: 59 | Source: Ambulatory Visit | Attending: Cardiology | Admitting: Cardiology

## 2021-09-04 DIAGNOSIS — I5022 Chronic systolic (congestive) heart failure: Secondary | ICD-10-CM | POA: Diagnosis not present

## 2021-09-04 DIAGNOSIS — I469 Cardiac arrest, cause unspecified: Secondary | ICD-10-CM

## 2021-09-04 NOTE — Progress Notes (Signed)
Daily Session Note  Patient Details  Name: Kristina Nichols MRN: [id number] Date of Birth: [birth year] Referring Provider:   Flowsheet Row CARDIAC REHAB PHASE II ORIENTATION from 08/13/2021 in [place]  Referring Provider Loralie Champagne, MD       Encounter Date: 09/04/2021  Check In:  Session Check In - 09/04/21 1536       Check-In   Supervising physician immediately available to respond to emergencies [facility] immediately available    Physician(s) Dr. Tawanna Solo    Location [facility]    Staff Present Seward Carol, MS, ACSM CEP, Exercise Physiologist;Theopolis Sloop, RN, Milus Glazier, MS, ACSM-CEP, CCRP, Exercise Physiologist;Jetta Walker BS, ACSM EP-C, Exercise Physiologist    Virtual Visit No    Medication changes reported     No    Fall or balance concerns reported    No    Tobacco Cessation No Change    Warm-up and Cool-down Performed as group-led instruction    Resistance Training Performed No    VAD Patient? No    PAD/SET Patient? No      Pain Assessment   Currently in Pain? No/denies    Pain Score 0-No pain    Multiple Pain Sites No             Capillary Blood Glucose: No results found for this or any previous visit (from the past 24 hour(s)).   Exercise Prescription Changes - 09/04/21 1622       Response to Exercise   Blood Pressure (Admit) 118/74    Blood Pressure (Exercise) 104/72    Blood Pressure (Exit) 107/78    Heart Rate (Admit) 84 bpm    Heart Rate (Exercise) 114 bpm    Heart Rate (Exit) 83 bpm    Rating of Perceived Exertion (Exercise) 10    Perceived Dyspnea (Exercise) 0    Symptoms 0    Comments Pt first day in the [facility]2 program    Duration Progress to 30 minutes of  aerobic without signs/symptoms of physical distress    Intensity THRR unchanged      Progression   Progression Continue to progress workloads to maintain intensity without signs/symptoms of physical distress.     Average METs 2.75      Resistance Training   Training Prescription No      NuStep   Level 2    SPM 75    Minutes 15    METs 2.4      Track   Laps 18    Minutes 15    METs 3.09             Social History   Tobacco Use  Smoking Status Never  Smokeless Tobacco Never    Goals Met:  Exercise tolerated well No report of concerns or symptoms today  Goals Unmet:  Not Applicable  Comments: [name] started cardiac rehab today.  Pt tolerated light exercise without difficulty. VSS, telemetry-Sinus Rhythm, paced on demand, questionable PVC's asymptomatic. Will send today's ECG tracings to Dr Claris Gladden office for review. Medication list reconciled. Pt denies barriers to medicaiton compliance.  PSYCHOSOCIAL ASSESSMENT:  PHQ-1. Pt exhibits positive coping skills, hopeful outlook with supportive family. No psychosocial needs identified at this time, no psychosocial interventions necessary.    Pt enjoys reading, talking with friends and walking.   Pt oriented to exercise equipment and routine.    Understanding verbalized.Barnet Pall, RN,BSN 09/04/2021 4:54 PM    Dr. Fransico Him is Medical Director  for Cardiac Rehab at [facility].

## 2021-09-06 ENCOUNTER — Encounter (HOSPITAL_COMMUNITY)
Admission: RE | Admit: 2021-09-06 | Discharge: 2021-09-06 | Disposition: A | Discharge status: Home / Self Care | Payer: 59 | Source: Ambulatory Visit | Attending: Cardiology | Admitting: Cardiology

## 2021-09-06 DIAGNOSIS — I5022 Chronic systolic (congestive) heart failure: Secondary | ICD-10-CM | POA: Diagnosis not present

## 2021-09-06 DIAGNOSIS — I469 Cardiac arrest, cause unspecified: Secondary | ICD-10-CM

## 2021-09-10 ENCOUNTER — Telehealth (HOSPITAL_COMMUNITY): Payer: Self-pay

## 2021-09-10 ENCOUNTER — Telehealth: Payer: Self-pay | Admitting: Pharmacist

## 2021-09-10 MED ORDER — OZEMPIC (1 MG/DOSE) 4 MG/3ML ~~LOC~~ SOPN: 1.0000 mg | PEN_INJECTOR | SUBCUTANEOUS | 0 refills | Status: DC

## 2021-09-10 NOTE — Telephone Encounter (Signed)
Called pt to follow up with Ozempic tolerability. Tolerating 1mg  dose well, 3rd dose due this Friday. Reports decreased appetite. Prefers to stay on 1mg  dose for another month. Refill sent in, will call pt again at that time to discuss last dose increase if she's interested. Is down from 198 lbs to 181 lbs so far.

## 2021-09-11 ENCOUNTER — Encounter (HOSPITAL_COMMUNITY): Payer: 59

## 2021-09-13 ENCOUNTER — Encounter (HOSPITAL_COMMUNITY)
Admission: RE | Admit: 2021-09-13 | Discharge: 2021-09-13 | Disposition: A | Discharge status: Home / Self Care | Payer: 59 | Source: Ambulatory Visit | Attending: Cardiology | Admitting: Cardiology

## 2021-09-13 DIAGNOSIS — I469 Cardiac arrest, cause unspecified: Secondary | ICD-10-CM | POA: Diagnosis not present

## 2021-09-13 DIAGNOSIS — I5022 Chronic systolic (congestive) heart failure: Secondary | ICD-10-CM | POA: Insufficient documentation

## 2021-09-16 ENCOUNTER — Encounter (HOSPITAL_COMMUNITY)
Admission: RE | Admit: 2021-09-16 | Discharge: 2021-09-16 | Disposition: A | Discharge status: Home / Self Care | Payer: 59 | Source: Ambulatory Visit | Attending: Cardiology | Admitting: Cardiology

## 2021-09-16 DIAGNOSIS — I5022 Chronic systolic (congestive) heart failure: Secondary | ICD-10-CM

## 2021-09-16 DIAGNOSIS — I469 Cardiac arrest, cause unspecified: Secondary | ICD-10-CM | POA: Diagnosis not present

## 2021-09-16 NOTE — Progress Notes (Signed)
QUALITY OF LIFE SCORE REVIEW  Pt completed Quality of Life survey as a participant in Cardiac Rehab.  Scores 21.0 or below are considered low.  Pt score very low in several areas Overall 20.07, Health and Function 22.4, socioeconomic 16.07, physiological and spiritual 23.14, family 14.40. Patient quality of life slightly altered by physical constraints which limits ability to perform as prior to recent cardiac illness. Kristina Nichols admits to having family stressors related to her adult son and says she is interested in going back to work.. Patient was given a vocational rehab packet to review. Offered emotional support and reassurance.  Will continue to monitor and intervene as necessary. Will forward quality of life questionnaire to Kristina Nichols office as Kristina Nichols does not have a primary care provider.Encouraged Kristina Nichols to try to establish care with a provider soon. Will contact the social worker at the heart failure clinic as Kristina Nichols says she is interested in participating in a heart failure support group. Kristina Headings RN BSN

## 2021-09-18 ENCOUNTER — Encounter (HOSPITAL_COMMUNITY): Payer: 59

## 2021-09-18 NOTE — Progress Notes (Signed)
Cardiac Individual Treatment Plan  Patient Details  Name: Kristina Nichols MRN: [id number] Date of Birth: [birth year] Referring Provider:   Flowsheet Row CARDIAC REHAB PHASE II ORIENTATION from 08/13/2021 in [facility]  Referring Provider Loralie Champagne, MD       Initial Encounter Date:  [facility] PHASE II ORIENTATION from 08/13/2021 in [facility]  Date 08/13/21       Visit Diagnosis: Heart failure, chronic systolic (HCC)  Patient's Home Medications on Admission:  Current Outpatient Medications:    acetaminophen (TYLENOL) 500 MG tablet, Take 1,000 mg by mouth every 6 (six) hours as needed for moderate pain., Disp: , Rfl:    buPROPion (WELLBUTRIN XL) 300 MG 24 hr tablet, Take 300 mg by mouth in the morning., Disp: , Rfl:    carvedilol (COREG) 6.25 MG tablet, TAKE 1 TABLET BY MOUTH 2 TIMES DAILY WITH A MEAL., Disp: 180 tablet, Rfl: 3   clonazePAM (KLONOPIN) 1 MG tablet, Take 1 mg by mouth 3 (three) times daily as needed for anxiety., Disp: , Rfl:    dapagliflozin propanediol (FARXIGA) 10 MG TABS tablet, Take 1 tablet (10 mg total) by mouth daily before breakfast., Disp: 90 tablet, Rfl: 3   lamoTRIgine (LAMICTAL) 100 MG tablet, Take 100 mg by mouth in the morning., Disp: , Rfl:    lamoTRIgine (LAMICTAL) 150 MG tablet, Take 150 mg by mouth at bedtime., Disp: , Rfl:    losartan (COZAAR) 25 MG tablet, Take 1 tablet (25 mg total) by mouth daily., Disp: 90 tablet, Rfl: 3   omeprazole (PRILOSEC OTC) 20 MG tablet, Take 20 mg by mouth daily as needed (for heartburn or indigestion)., Disp: , Rfl:    pregabalin (LYRICA) 100 MG capsule, Take 100 mg by mouth daily., Disp: , Rfl:    Semaglutide, 1 MG/DOSE, (OZEMPIC, 1 MG/DOSE,) 4 MG/3ML SOPN, Inject 1 mg into the skin once a week., Disp: 3 mL, Rfl: 0   spironolactone (ALDACTONE) 25 MG tablet, TAKE 1 TABLET (25 MG TOTAL) BY MOUTH DAILY., Disp: 90 tablet, Rfl: 3  Past Medical  History: Past Medical History:  Diagnosis Date   Anxiety    CHF (congestive heart failure) ([place])    Depression    Hyperlipidemia    Hypothyroidism    Insomnia    Scoliosis     Tobacco Use: Social History   Tobacco Use  Smoking Status Never  Smokeless Tobacco Never    Labs: Review Flowsheet        Latest Ref Rng & Units 02/07/2020 02/08/2020 02/09/2020 02/10/2020  Labs for ITP Cardiac and Pulmonary Rehab  Cholestrol 0 - 200 mg/dL      LDL (calc) 0 - 99 mg/dL      HDL-C >40 mg/dL      Trlycerides <150 mg/dL  85      O2 Saturation % 75.9   82.3   86.1   81.2       06/18/2021  Labs for ITP Cardiac and Pulmonary Rehab  Cholestrol 234    LDL (calc) 150    HDL-C 72    Trlycerides 58    O2 Saturation           Capillary Blood Glucose: Lab Results  Component Value Date   GLUCAP 111 (H) 02/11/2020   GLUCAP 97 02/11/2020   GLUCAP 104 (H) 02/11/2020   GLUCAP 84 02/11/2020   GLUCAP 96 02/11/2020     Exercise Target Goals: Exercise Program Goal:  Individual exercise prescription set using results from initial 6 min walk test and THRR while considering  patient's activity barriers and safety.   Exercise Prescription Goal: Starting with aerobic activity 30 plus minutes a day, 3 days per week for initial exercise prescription. Provide home exercise prescription and guidelines that participant acknowledges understanding prior to discharge.  Activity Barriers & Risk Stratification:  Activity Barriers & Cardiac Risk Stratification - 08/13/21 1610       Activity Barriers & Cardiac Risk Stratification   Activity Barriers Back Problems;Joint Problems;Neck/Spine Problems    Cardiac Risk Stratification High             6 Minute Walk:  6 Minute Walk     Row Name 08/13/21 1453         6 Minute Walk   Phase Initial     Distance 1302 feet     Walk Time 6 minutes     # of Rest Breaks 0     MPH 2.5     METS 3.32     RPE 7     Perceived Dyspnea  0     VO2 Peak  11.6     Symptoms No     Resting HR 87 bpm     Resting BP 106/73     Resting Oxygen Saturation  95 %     Exercise Oxygen Saturation  during 6 min walk 97 %     Max Ex. HR 97 bpm     Max Ex. BP 114/79     2 Minute Post BP 94/59              Oxygen Initial Assessment:   Oxygen Re-Evaluation:   Oxygen Discharge (Final Oxygen Re-Evaluation):   Initial Exercise Prescription:  Initial Exercise Prescription - 08/13/21 1600       Date of Initial Exercise RX and Referring Provider   Date 08/13/21    Referring Provider Loralie Champagne, MD    Expected Discharge Date 10/11/21      NuStep   Level 2    SPM 75    Minutes 15    METs 2.4      Track   Laps 12    Minutes 15    METs 2.39      Prescription Details   Frequency (times per week) 3    Duration Progress to 30 minutes of continuous aerobic without signs/symptoms of physical distress      Intensity   THRR 40-80% of Max Heartrate 67-134    Ratings of Perceived Exertion 11-13    Perceived Dyspnea 0-4      Progression   Progression Continue progressive overload as per policy without signs/symptoms or physical distress.      Resistance Training   Training Prescription Yes    Weight 2 lbs    Reps 10-15             Perform Capillary Blood Glucose checks as needed.  Exercise Prescription Changes:   Exercise Prescription Changes     Row Name 09/04/21 1622             Response to Exercise   Blood Pressure (Admit) 118/74       Blood Pressure (Exercise) 104/72       Blood Pressure (Exit) 107/78       Heart Rate (Admit) 84 bpm       Heart Rate (Exercise) 114 bpm       Heart Rate (Exit) 83 bpm  Rating of Perceived Exertion (Exercise) 10       Perceived Dyspnea (Exercise) 0       Symptoms 0       Comments Pt first day in the CRP2 program       Duration Progress to 30 minutes of  aerobic without signs/symptoms of physical distress       Intensity THRR unchanged         Progression   Progression  Continue to progress workloads to maintain intensity without signs/symptoms of physical distress.       Average METs 2.75         Resistance Training   Training Prescription No         NuStep   Level 2       SPM 75       Minutes 15       METs 2.4         Track   Laps 18       Minutes 15       METs 3.09                Exercise Comments:   Exercise Comments     Row Name 09/04/21 1628           Exercise Comments Pt first day in the CRP2 program. Pt tolerated exercise well with an average MET level of 2.75. Pt is learning her THRR, RPE and ExRx                Exercise Goals and Review:   Exercise Goals     Row Name 08/13/21 1359             Exercise Goals   Increase Physical Activity Yes       Intervention Provide advice, education, support and counseling about physical activity/exercise needs.;Develop an individualized exercise prescription for aerobic and resistive training based on initial evaluation findings, risk stratification, comorbidities and participant's personal goals.       Expected Outcomes Short Term: Attend rehab on a regular basis to increase amount of physical activity.;Long Term: Exercising regularly at least 3-5 days a week.;Long Term: Add in home exercise to make exercise part of routine and to increase amount of physical activity.       Increase Strength and Stamina Yes       Intervention Provide advice, education, support and counseling about physical activity/exercise needs.;Develop an individualized exercise prescription for aerobic and resistive training based on initial evaluation findings, risk stratification, comorbidities and participant's personal goals.       Expected Outcomes Short Term: Increase workloads from initial exercise prescription for resistance, speed, and METs.;Short Term: Perform resistance training exercises routinely during rehab and add in resistance training at home;Long Term: Improve cardiorespiratory fitness,  muscular endurance and strength as measured by increased METs and functional capacity (6MWT)       Able to understand and use rate of perceived exertion (RPE) scale Yes       Intervention Provide education and explanation on how to use RPE scale       Expected Outcomes Short Term: Able to use RPE daily in rehab to express subjective intensity level;Long Term:  Able to use RPE to guide intensity level when exercising independently       Knowledge and understanding of Target Heart Rate Range (THRR) Yes       Intervention Provide education and explanation of THRR including how the numbers were predicted and where they are located for  reference       Expected Outcomes Short Term: Able to state/look up THRR;Long Term: Able to use THRR to govern intensity when exercising independently;Short Term: Able to use daily as guideline for intensity in rehab       Able to check pulse independently Yes       Intervention Provide education and demonstration on how to check pulse in carotid and radial arteries.;Review the importance of being able to check your own pulse for safety during independent exercise       Expected Outcomes Short Term: Able to explain why pulse checking is important during independent exercise;Long Term: Able to check pulse independently and accurately       Understanding of Exercise Prescription Yes       Intervention Provide education, explanation, and written materials on patient's individual exercise prescription       Expected Outcomes Short Term: Able to explain program exercise prescription;Long Term: Able to explain home exercise prescription to exercise independently                Exercise Goals Re-Evaluation :  Exercise Goals Re-Evaluation     Row Name 09/04/21 1626             Exercise Goal Re-Evaluation   Exercise Goals Review Increase Physical Activity;Increase Strength and Stamina;Able to understand and use rate of perceived exertion (RPE) scale;Knowledge and  understanding of Target Heart Rate Range (THRR);Understanding of Exercise Prescription       Comments Pt first day in the CRP2 program. Pt tolerated exercise well with an average MET level of 2.75. Pt is learning her THRR, RPE and ExRx       Expected Outcomes Will continue to monitor pt and progress workloads as tolerated without sign or symptom                 Discharge Exercise Prescription (Final Exercise Prescription Changes):  Exercise Prescription Changes - 09/04/21 1622       Response to Exercise   Blood Pressure (Admit) 118/74    Blood Pressure (Exercise) 104/72    Blood Pressure (Exit) 107/78    Heart Rate (Admit) 84 bpm    Heart Rate (Exercise) 114 bpm    Heart Rate (Exit) 83 bpm    Rating of Perceived Exertion (Exercise) 10    Perceived Dyspnea (Exercise) 0    Symptoms 0    Comments Pt first day in the CRP2 program    Duration Progress to 30 minutes of  aerobic without signs/symptoms of physical distress    Intensity THRR unchanged      Progression   Progression Continue to progress workloads to maintain intensity without signs/symptoms of physical distress.    Average METs 2.75      Resistance Training   Training Prescription No      NuStep   Level 2    SPM 75    Minutes 15    METs 2.4      Track   Laps 18    Minutes 15    METs 3.09             Nutrition:  Target Goals: Understanding of nutrition guidelines, daily intake of sodium <1551m, cholesterol <2016m calories 30% from fat and 7% or less from saturated fats, daily to have 5 or more servings of fruits and vegetables.  Biometrics:  Pre Biometrics - 08/13/21 1330       Pre Biometrics   Waist Circumference 37 inches    Hip  Circumference 47 inches    Waist to Hip Ratio 0.79 %    Triceps Skinfold 34 mm    % Body Fat 44 %    Grip Strength 18 kg    Flexibility 20.5 in    Single Leg Stand 10.25 seconds              Nutrition Therapy Plan and Nutrition Goals:  Nutrition Therapy &  Goals - 08/20/21 0804       Nutrition Therapy   Diet Heart Healthy Diet      Personal Nutrition Goals   Nutrition Goal Patient will build a healthy plate to include lean protein/plant protein, fruit, vegetables, whole grains, low fat dairy products as part of a heart healthy diet.    Personal Goal #2 Patient to identify and limit food sources of saturated fat, trans fat, refined carbohydrates, and sodium    Personal Goal #3 Patient to identify food quantities necessary to achieve weight loss of 0.5-2.0# per week      Intervention Plan   Intervention Prescribe, educate and counsel regarding individualized specific dietary modifications aiming towards targeted core components such as weight, hypertension, lipid management, diabetes, heart failure and other comorbidities.;Nutrition handout(s) given to patient.    Expected Outcomes Short Term Goal: Understand basic principles of dietary content, such as calories, fat, sodium, cholesterol and nutrients.;Short Term Goal: A plan has been developed with personal nutrition goals set during dietitian appointment.;Long Term Goal: Adherence to prescribed nutrition plan.             Nutrition Assessments:  Nutrition Assessments - 08/20/21 0803       Rate Your Plate Scores   Pre Score 70            MEDIFICTS Score Key: ?70 Need to make dietary changes  40-70 Heart Healthy Diet ? 40 Therapeutic Level Cholesterol Diet  Flowsheet Row CARDIAC REHAB PHASE II EXERCISE from 08/19/2021 in [facility]  Picture Your Plate Total Score on Admission 70      Picture Your Plate Scores: <27 Unhealthy dietary pattern with much room for improvement. 41-50 Dietary pattern unlikely to meet recommendations for good health and room for improvement. 51-60 More healthful dietary pattern, with some room for improvement.  >60 Healthy dietary pattern, although there may be some specific behaviors that could be improved.     Nutrition Goals Re-Evaluation:   Nutrition Goals Discharge (Final Nutrition Goals Re-Evaluation):   Psychosocial: Target Goals: Acknowledge presence or absence of significant depression and/or stress, maximize coping skills, provide positive support system. Participant is able to verbalize types and ability to use techniques and skills needed for reducing stress and depression.  Initial Review & Psychosocial Screening:  Initial Psych Review & Screening - 08/13/21 1735       Initial Review   Current issues with History of Depression;Current Depression;Current Stress Concerns    Source of Stress Concerns Chronic Illness;Family;Unable to perform yard/household activities    Comments Donielle says she is experiencing some depression she hopes to return to work or go to school in the near future. Annasofia is taking an antidepressant currently      Family Dynamics   Good Support System? Yes   Kristle has her husband and son for support     Barriers   Psychosocial barriers to participate in program The patient should benefit from training in stress management and relaxation.      Screening Interventions   Interventions Encouraged to exercise;To provide support  and resources with identified psychosocial needs    Expected Outcomes Long Term Goal: Stressors or current issues are controlled or eliminated.;Short Term goal: Identification and review with participant of any Quality of Life or Depression concerns found by scoring the questionnaire.;Long Term goal: The participant improves quality of Life and PHQ9 Scores as seen by post scores and/or verbalization of changes             Quality of Life Scores:  Quality of Life - 08/13/21 1613       Quality of Life   Select Quality of Life      Quality of Life Scores   Health/Function Pre 22.4 %    Socioeconomic Pre 16.07 %    Psych/Spiritual Pre 23.14 %    Family Pre 14.4 %    GLOBAL Pre 20.07 %            Scores of 19 and  below usually indicate a poorer quality of life in these areas.  A difference of  2-3 points is a clinically meaningful difference.  A difference of 2-3 points in the total score of the Quality of Life Index has been associated with significant improvement in overall quality of life, self-image, physical symptoms, and general health in studies assessing change in quality of life.  PHQ-9: Review Flowsheet        08/19/2021 08/13/2021  Depression screen PHQ 2/9  Decreased Interest 0 0  Down, Depressed, Hopeless 0 1  PHQ - 2 Score 0 1         Interpretation of Total Score  Total Score Depression Severity:  1-4 = Minimal depression, 5-9 = Mild depression, 10-14 = Moderate depression, 15-19 = Moderately severe depression, 20-27 = Severe depression   Psychosocial Evaluation and Intervention:   Psychosocial Re-Evaluation:  Psychosocial Re-Evaluation     [place] Name 09/06/21 0723 09/18/21 1423           Psychosocial Re-Evaluation   Current issues with Current Stress Concerns;History of Depression;Current Depression;Current Anxiety/Panic Current Depression;History of Depression;Current Stress Concerns;Current Anxiety/Panic      Comments Will review Myalee's quality of life in the upcoming sessions Reviewed quality of life questionnaire with the patient. Would benefit from counselling. QOL forwarded to primary care as Latasha does not have a primary care provider.  Nicholas has a lot of family stressors      Expected Outcomes Geraldene will have decreased depression upon completion of phase 2 cardiac rehab. Mayte's depressioon, anxiety and stressors will be controlled. Aleysia will seek counseling      Interventions Encouraged to attend Cardiac Rehabilitation for the exercise;Stress management education;Relaxation education Encouraged to attend Cardiac Rehabilitation for the exercise;Stress management education;Encouraged to attend Pulmonary Rehabilitation for the exercise;Relaxation education       Continue Psychosocial Services  Follow up required by staff Follow up required by staff        Initial Review   Source of Stress Concerns Family;Unable to participate in former interests or hobbies;Unable to perform yard/household activities;Chronic Illness Chronic Illness;Family;Poor Coping Skills;Retirement/disability      Comments Will continue to monitor and offer support as needed Will continue to monitor the patient and offer support as needed               Psychosocial Discharge (Final Psychosocial Re-Evaluation):  Psychosocial Re-Evaluation - 09/18/21 1423       Psychosocial Re-Evaluation   Current issues with Current Depression;History of Depression;Current Stress Concerns;Current Anxiety/Panic    Comments Reviewed quality of life questionnaire  with the patient. Would benefit from counselling. QOL forwarded to primary care as Jamyria does not have a primary care provider.  Shelbi has a lot of family stressors    Expected Outcomes Gini's depressioon, anxiety and stressors will be controlled. Jeanny will seek counseling    Interventions Encouraged to attend Cardiac Rehabilitation for the exercise;Stress management education;Encouraged to attend Pulmonary Rehabilitation for the exercise;Relaxation education    Continue Psychosocial Services  Follow up required by staff      Initial Review   Source of Stress Concerns Chronic Illness;Family;Poor Coping Skills;Retirement/disability    Comments Will continue to monitor the patient and offer support as needed             Vocational Rehabilitation: Provide vocational rehab assistance to qualifying candidates.   Vocational Rehab Evaluation & Intervention:  Vocational Rehab - 09/18/21 1428       Initial Vocational Rehab Evaluation & Intervention   Assessment shows need for Vocational Rehabilitation Yes    Vocational Rehab Packet given to patient 09/16/21      Vocational Rehab Re-[facility]   Comments Patient will  review vocational rehab and let us know iif she wants to proceed with the referral             Education: Education Goals: Education classes will be provided on a weekly basis, covering required topics. Participant will state understanding/return demonstration of topics presented.  Learning Barriers/Preferences:  Learning Barriers/Preferences - 08/13/21 1614       Learning Barriers/Preferences   Learning Barriers None    Learning Preferences Audio;Skilled Demonstration;Computer/Internet;Verbal Instruction;Group Instruction;Video;Individual Instruction;Written Material;Pictoral             Education Topics: Hypertension, Hypertension Reduction -Define heart disease and high blood pressure. Discus how high blood pressure affects the body and ways to reduce high blood pressure.   Exercise and Your Heart -Discuss why it is important to exercise, the FITT principles of exercise, normal and abnormal responses to exercise, and how to exercise safely.   Angina -Discuss definition of angina, causes of angina, treatment of angina, and how to decrease risk of having angina.   Cardiac Medications -Review what the following cardiac medications are used for, how they affect the body, and side effects that may occur when taking the medications.  Medications include Aspirin, Beta blockers, calcium channel blockers, ACE Inhibitors, angiotensin receptor blockers, diuretics, digoxin, and antihyperlipidemics.   Congestive Heart Failure -Discuss the definition of CHF, how to live with CHF, the signs and symptoms of CHF, and how keep track of weight and sodium intake.   Heart Disease and Intimacy -Discus the effect sexual activity has on the heart, how changes occur during intimacy as we age, and safety during sexual activity.   Smoking Cessation / COPD -Discuss different methods to quit smoking, the health benefits of quitting smoking, and the definition of COPD.   Nutrition I:  Fats -Discuss the types of cholesterol, what cholesterol does to the heart, and how cholesterol levels can be controlled.   Nutrition II: Labels -Discuss the different components of food labels and how to read food label   Heart Parts/Heart Disease and PAD -Discuss the anatomy of the heart, the pathway of blood circulation through the heart, and these are affected by heart disease.   Stress I: Signs and Symptoms -Discuss the causes of stress, how stress may lead to anxiety and depression, and ways to limit stress.   Stress II: Relaxation -Discuss different types of relaxation techniques to limit stress.  Warning Signs of Stroke / TIA -Discuss definition of a stroke, what the signs and symptoms are of a stroke, and how to identify when someone is having stroke.   Knowledge Questionnaire Score:  Knowledge Questionnaire Score - 08/13/21 1615       Knowledge Questionnaire Score   Pre Score 22/24             Core Components/Risk Factors/Patient Goals at Admission:  Personal Goals and Risk Factors at Admission - 08/13/21 1623       Core Components/Risk Factors/Patient Goals on Admission    Weight Management Yes;Obesity;Weight Loss    Intervention Weight Management: Develop a combined nutrition and exercise program designed to reach desired caloric intake, while maintaining appropriate intake of nutrient and fiber, sodium and fats, and appropriate energy expenditure required for the weight goal.;Weight Management: Provide education and appropriate resources to help participant work on and attain dietary goals.;Weight Management/Obesity: Establish reasonable short term and long term weight goals.;Obesity: Provide education and appropriate resources to help participant work on and attain dietary goals.    Admit Weight 186 lb 15.2 oz (84.8 kg)    Expected Outcomes Short Term: Continue to assess and modify interventions until short term weight is achieved;Long Term: Adherence to  nutrition and physical activity/exercise program aimed toward attainment of established weight goal;Weight Maintenance: Understanding of the daily nutrition guidelines, which includes 25-35% calories from fat, 7% or less cal from saturated fats, less than $RemoveB'200mg'cRSsLqVh$  cholesterol, less than 1.5gm of sodium, & 5 or more servings of fruits and vegetables daily;Weight Loss: Understanding of general recommendations for a balanced deficit meal plan, which promotes 1-2 lb weight loss per week and includes a negative energy balance of [phone] kcal/d;Understanding recommendations for meals to include 15-35% energy as protein, 25-35% energy from fat, 35-60% energy from carbohydrates, less than $RemoveB'200mg'EAvrjPeV$  of dietary cholesterol, 20-35 gm of total fiber daily;Understanding of distribution of calorie intake throughout the day with the consumption of 4-5 meals/snacks    Heart Failure Yes    Intervention Provide a combined exercise and nutrition program that is supplemented with education, support and counseling about heart failure. Directed toward relieving symptoms such as shortness of breath, decreased exercise tolerance, and extremity edema.    Expected Outcomes Improve functional capacity of life;Short term: Attendance in program 2-3 days a week with increased exercise capacity. Reported lower sodium intake. Reported increased fruit and vegetable intake. Reports medication compliance.;Short term: Daily weights obtained and reported for increase. Utilizing diuretic protocols set by physician.;Long term: Adoption of self-care skills and reduction of barriers for early signs and symptoms recognition and intervention leading to self-care maintenance.    Lipids Yes    Intervention Provide education and support for participant on nutrition & aerobic/resistive exercise along with prescribed medications to achieve LDL '70mg'$ , HDL >$Remo'40mg'zNime$ .    Expected Outcomes Short Term: Participant states understanding of desired cholesterol values and is  compliant with medications prescribed. Participant is following exercise prescription and nutrition guidelines.;Long Term: Cholesterol controlled with medications as prescribed, with individualized exercise RX and with personalized nutrition plan. Value goals: LDL < $Rem'70mg'vGCP$ , HDL > 40 mg.             Core Components/Risk Factors/Patient Goals Review:   Goals and Risk Factor Review     Row Name 09/06/21 0727 09/18/21 1431           Core Components/Risk Factors/Patient Goals Review   Personal Goals Review Weight Management/Obesity;Lipids;Heart Failure;Stress Weight Management/Obesity;Lipids;Heart Failure;Stress      Review  Margarine started cardiac rehab on 09/06/21. Kyarah did well with exercise Julliana has been doing well with exercise at cardiac rehab. Resting systolic BP noted in the 94'T Dr Claris [place] office was notified      Expected Outcomes Jalissa will continue to participate in phase 2 cardiac rehab for exercise, nutrition and lifestyle modifications Sham will continue to participate in phase 2 cardiac rehab for exercise, nutrition and lifestyle modifications               Core Components/Risk Factors/Patient Goals at Discharge (Final Review):   Goals and Risk Factor Review - 09/18/21 1431       Core Components/Risk Factors/Patient Goals Review   Personal Goals Review Weight Management/Obesity;Lipids;Heart Failure;Stress    Review Marieanne has been doing well with exercise at cardiac rehab. Resting systolic BP noted in the 97'D Dr Claris [place] office was notified    Expected Outcomes Lexa will continue to participate in phase 2 cardiac rehab for exercise, nutrition and lifestyle modifications             ITP Comments:  ITP Comments     Row Name 08/13/21 1346 09/06/21 0722 09/18/21 1422       ITP Comments Medical Director- Dr. Fransico Him, MD 30 Day ITP Review. Kendi started exercise at cardiac rehab on 09/04/21 and did well with exercise 30 Day ITP Review. Winry  is off to a good start to exercise at cardiac rehab.              Comments: See ITP comments.Harrell Gave RN BSN

## 2021-09-20 ENCOUNTER — Telehealth (HOSPITAL_COMMUNITY): Payer: Self-pay | Admitting: Licensed Clinical Social Worker

## 2021-09-20 ENCOUNTER — Other Ambulatory Visit: Payer: Self-pay | Admitting: Cardiology

## 2021-09-20 ENCOUNTER — Encounter (HOSPITAL_COMMUNITY)
Admission: RE | Admit: 2021-09-20 | Discharge: 2021-09-20 | Disposition: A | Discharge status: Home / Self Care | Payer: 59 | Source: Ambulatory Visit | Attending: Cardiology | Admitting: Cardiology

## 2021-09-20 DIAGNOSIS — I469 Cardiac arrest, cause unspecified: Secondary | ICD-10-CM | POA: Diagnosis not present

## 2021-09-20 DIAGNOSIS — I5022 Chronic systolic (congestive) heart failure: Secondary | ICD-10-CM

## 2021-09-20 NOTE — Telephone Encounter (Signed)
CSW received message from cardiac rehab that pt is interested in joining [facility].  CSW called pt to confirm she is agreeable to being added to mailing list and provide with information about group- unable to reach- left VM requesting return call  Burna Sis, LCSW Clinical Social Worker [facility] Desk#: [phone] Cell#: [phone]

## 2021-09-23 ENCOUNTER — Encounter (HOSPITAL_COMMUNITY)
Admission: RE | Admit: 2021-09-23 | Discharge: 2021-09-23 | Disposition: A | Discharge status: Home / Self Care | Payer: 59 | Source: Ambulatory Visit | Attending: Cardiology | Admitting: Cardiology

## 2021-09-23 DIAGNOSIS — I469 Cardiac arrest, cause unspecified: Secondary | ICD-10-CM | POA: Diagnosis not present

## 2021-09-23 DIAGNOSIS — I5022 Chronic systolic (congestive) heart failure: Secondary | ICD-10-CM

## 2021-09-24 ENCOUNTER — Telehealth (HOSPITAL_COMMUNITY): Payer: Self-pay | Admitting: Licensed Clinical Social Worker

## 2021-09-24 NOTE — Telephone Encounter (Signed)
CSW called to to discuss [facility].  Unable to reach- left VM informing of group and that we would be adding her to mailing list for our monthly flyers.  Provided my number for her to call back with any questions or to request to be taken off list.  Burna Sis, LCSW Clinical Social Worker [facility] Desk#: [phone] Cell#: [phone]

## 2021-09-25 ENCOUNTER — Encounter (HOSPITAL_COMMUNITY)
Admission: RE | Admit: 2021-09-25 | Discharge: 2021-09-25 | Disposition: A | Discharge status: Home / Self Care | Payer: 59 | Source: Ambulatory Visit | Attending: Cardiology | Admitting: Cardiology

## 2021-09-25 DIAGNOSIS — I5022 Chronic systolic (congestive) heart failure: Secondary | ICD-10-CM

## 2021-09-25 DIAGNOSIS — I469 Cardiac arrest, cause unspecified: Secondary | ICD-10-CM | POA: Diagnosis not present

## 2021-09-25 NOTE — Progress Notes (Signed)
CARDIAC REHAB PHASE 2  Reviewed home exercise with pt today. Pt is tolerating exercise well. Pt will continue to exercise on her own by walking and going to [employer] for 30-45 minutes per session 1-2 days a week in addition to the 3 days in [facility]2. Advised pt on THRR, RPE scale, hydration and temperature/humidity precautions. Reinforced S/S to stop exercise and when to call MD vs 911. Encouraged warm up cool down and stretches with exercise sessions. Pt verbalized understanding, all questions were answered and pt was given a copy to take home.    Kirby Funk ACSM-CEP 09/25/2021 4:26 PM

## 2021-09-27 ENCOUNTER — Encounter (HOSPITAL_COMMUNITY)
Admission: RE | Admit: 2021-09-27 | Discharge: 2021-09-27 | Disposition: A | Discharge status: Home / Self Care | Payer: 59 | Source: Ambulatory Visit | Attending: Cardiology | Admitting: Cardiology

## 2021-09-27 DIAGNOSIS — I5022 Chronic systolic (congestive) heart failure: Secondary | ICD-10-CM

## 2021-09-27 DIAGNOSIS — I469 Cardiac arrest, cause unspecified: Secondary | ICD-10-CM

## 2021-09-30 ENCOUNTER — Encounter (HOSPITAL_COMMUNITY): Payer: 59

## 2021-10-02 ENCOUNTER — Encounter (HOSPITAL_COMMUNITY)
Admission: RE | Admit: 2021-10-02 | Discharge: 2021-10-02 | Disposition: A | Discharge status: Home / Self Care | Payer: 59 | Source: Ambulatory Visit | Attending: Cardiology | Admitting: Cardiology

## 2021-10-02 DIAGNOSIS — I469 Cardiac arrest, cause unspecified: Secondary | ICD-10-CM

## 2021-10-02 DIAGNOSIS — I5022 Chronic systolic (congestive) heart failure: Secondary | ICD-10-CM | POA: Diagnosis not present

## 2021-10-02 NOTE — Progress Notes (Signed)
Incomplete Session Note  Patient Details  Name: Kristina Nichols MRN: [id number] Date of Birth: 12/18/1965 Referring Provider:   Flowsheet Row CARDIAC REHAB PHASE II ORIENTATION from 08/13/2021 in [facility] C[facility]AC [facility]  Referring Provider Marca Ancona, MD       [name] did not complete her rehab session.  [name] was walking very slowly on the walking track reporting Right groin pain. [name] rated the pain in her right groin a 5/10. [name] switched to the nutstep and decided she needed to go home after 5 minutes. Exit blood pressure 100/68 heart rate 59. Oxygen saturation 98% on room air.I advised [name] to go to urgent care if her groin does not improve as she does not have a primary care provider. [name] said she hopes to return to exercise on Friday if she is feeling better. [name] is going through a lot of family stress involving her son.Thayer Headings RN BSN

## 2021-10-03 ENCOUNTER — Telehealth (HOSPITAL_COMMUNITY): Payer: Self-pay | Admitting: General Practice

## 2021-10-04 ENCOUNTER — Encounter (HOSPITAL_COMMUNITY): Payer: 59

## 2021-10-07 ENCOUNTER — Encounter (HOSPITAL_COMMUNITY)
Admission: RE | Admit: 2021-10-07 | Discharge: 2021-10-07 | Disposition: A | Discharge status: Home / Self Care | Payer: 59 | Source: Ambulatory Visit | Attending: Cardiology | Admitting: Cardiology

## 2021-10-07 VITALS — Ht 63.0 in | Wt 176.8 lb

## 2021-10-07 DIAGNOSIS — I5022 Chronic systolic (congestive) heart failure: Secondary | ICD-10-CM | POA: Diagnosis not present

## 2021-10-07 DIAGNOSIS — I469 Cardiac arrest, cause unspecified: Secondary | ICD-10-CM

## 2021-10-08 ENCOUNTER — Ambulatory Visit (HOSPITAL_BASED_OUTPATIENT_CLINIC_OR_DEPARTMENT_OTHER): Payer: 59 | Admitting: Family Medicine

## 2021-10-09 ENCOUNTER — Telehealth (HOSPITAL_COMMUNITY): Payer: Self-pay | Admitting: General Practice

## 2021-10-09 ENCOUNTER — Encounter (HOSPITAL_COMMUNITY): Payer: 59

## 2021-10-10 NOTE — Progress Notes (Signed)
Cardiac Individual Treatment Plan  Patient Details  Name: Kristina Nichols MRN: [id number] Date of Birth: [birth year] Referring Provider:   Flowsheet Row CARDIAC REHAB PHASE II ORIENTATION from 08/13/2021 in [facility]  Referring Provider Loralie Champagne, MD       Initial Encounter Date:  [place] PHASE II ORIENTATION from 08/13/2021 in [facility]  Date 08/13/21       Visit Diagnosis: Heart failure, chronic systolic ([place])  Cardiac arrest ([place])  Patient's Home Medications on Admission:  Current Outpatient Medications:    acetaminophen (TYLENOL) 500 MG tablet, Take 1,000 mg by mouth every 6 (six) hours as needed for moderate pain., Disp: , Rfl:    buPROPion (WELLBUTRIN XL) 300 MG 24 hr tablet, Take 300 mg by mouth in the morning., Disp: , Rfl:    carvedilol (COREG) 6.25 MG tablet, TAKE 1 TABLET BY MOUTH 2 TIMES DAILY WITH A MEAL., Disp: 180 tablet, Rfl: 3   clonazePAM (KLONOPIN) 1 MG tablet, Take 1 mg by mouth 3 (three) times daily as needed for anxiety., Disp: , Rfl:    dapagliflozin propanediol (FARXIGA) 10 MG TABS tablet, Take 1 tablet (10 mg total) by mouth daily before breakfast., Disp: 90 tablet, Rfl: 3   lamoTRIgine (LAMICTAL) 100 MG tablet, Take 100 mg by mouth in the morning., Disp: , Rfl:    lamoTRIgine (LAMICTAL) 150 MG tablet, Take 150 mg by mouth at bedtime., Disp: , Rfl:    losartan (COZAAR) 25 MG tablet, Take 1 tablet (25 mg total) by mouth daily., Disp: 90 tablet, Rfl: 3   omeprazole (PRILOSEC OTC) 20 MG tablet, Take 20 mg by mouth daily as needed (for heartburn or indigestion)., Disp: , Rfl:    pregabalin (LYRICA) 100 MG capsule, Take 100 mg by mouth daily., Disp: , Rfl:    Semaglutide, 1 MG/DOSE, (OZEMPIC, 1 MG/DOSE,) 4 MG/3ML SOPN, Inject 1 mg into the skin once a week., Disp: 3 mL, Rfl: 0   spironolactone (ALDACTONE) 25 MG tablet, TAKE 1 TABLET (25 MG TOTAL) BY MOUTH DAILY., Disp: 90 tablet,  Rfl: 3  Past Medical History: Past Medical History:  Diagnosis Date   Anxiety    CHF (congestive heart failure) ([place])    Depression    Hyperlipidemia    Hypothyroidism    Insomnia    Scoliosis     Tobacco Use: Social History   Tobacco Use  Smoking Status Never  Smokeless Tobacco Never    Labs: Review Flowsheet  More data exists      Latest Ref Rng & Units 02/07/2020 02/08/2020 02/09/2020 02/10/2020  Labs for ITP Cardiac and Pulmonary Rehab  Cholestrol 0 - 200 mg/dL - - - -  LDL (calc) 0 - 99 mg/dL - - - -  HDL-C >40 mg/dL - - - -  Trlycerides <150 mg/dL - 85  - -  O2 Saturation % 75.9  82.3  86.1  81.2       06/18/2021  Labs for ITP Cardiac and Pulmonary Rehab  Cholestrol 234   LDL (calc) 150   HDL-C 72   Trlycerides 58   O2 Saturation -    Capillary Blood Glucose: Lab Results  Component Value Date   GLUCAP 111 (H) 02/11/2020   GLUCAP 97 02/11/2020   GLUCAP 104 (H) 02/11/2020   GLUCAP 84 02/11/2020   GLUCAP 96 02/11/2020     Exercise Target Goals: Exercise Program Goal: Individual exercise prescription set using results from initial  6 min walk test and THRR while considering  patient's activity barriers and safety.   Exercise Prescription Goal: Initial exercise prescription builds to 30-45 minutes a day of aerobic activity, 2-3 days per week.  Home exercise guidelines will be given to patient during program as part of exercise prescription that the participant will acknowledge.  Activity Barriers & Risk Stratification:  Activity Barriers & Cardiac Risk Stratification - 08/13/21 1610       Activity Barriers & Cardiac Risk Stratification   Activity Barriers Back Problems;Joint Problems;Neck/Spine Problems    Cardiac Risk Stratification High             6 Minute Walk:  6 Minute Walk     Row Name 08/13/21 1453 10/07/21 1644       6 Minute Walk   Phase Initial Discharge    Distance 1302 feet 1693 feet    Distance % Change -- 30.03 %     Distance Feet Change -- 391 ft    Walk Time 6 minutes 6 minutes    # of Rest Breaks 0 0    MPH 2.5 3.21    METS 3.32 3.98    RPE 7 15    Perceived Dyspnea  0 0    VO2 Peak 11.6 13.92    Symptoms No No    Resting HR 87 bpm 82 bpm    Resting BP 106/73 109/75    Resting Oxygen Saturation  95 % --    Exercise Oxygen Saturation  during 6 min walk 97 % 97 %    Max Ex. HR 97 bpm 82 bpm    Max Ex. BP 114/79 130/70    2 Minute Post BP 94/59 --             Oxygen Initial Assessment:   Oxygen Re-Evaluation:   Oxygen Discharge (Final Oxygen Re-Evaluation):   Initial Exercise Prescription:  Initial Exercise Prescription - 08/13/21 1600       Date of Initial Exercise RX and Referring Provider   Date 08/13/21    Referring Provider Loralie Champagne, MD    Expected Discharge Date 10/11/21      NuStep   Level 2    SPM 75    Minutes 15    METs 2.4      Track   Laps 12    Minutes 15    METs 2.39      Prescription Details   Frequency (times per week) 3    Duration Progress to 30 minutes of continuous aerobic without signs/symptoms of physical distress      Intensity   THRR 40-80% of Max Heartrate 67-134    Ratings of Perceived Exertion 11-13    Perceived Dyspnea 0-4      Progression   Progression Continue progressive overload as per policy without signs/symptoms or physical distress.      Resistance Training   Training Prescription Yes    Weight 2 lbs    Reps 10-15             Perform Capillary Blood Glucose checks as needed.  Exercise Prescription Changes:   Exercise Prescription Changes     Row Name 09/04/21 1622 09/25/21 1618 10/07/21 1647         Response to Exercise   Blood Pressure (Admit) 118/74 98/72 109/75     Blood Pressure (Exercise) 104/72 106/62 130/70     Blood Pressure (Exit) 107/78 94/62 98/62     Heart Rate (Admit) 84 bpm 96  bpm --     Heart Rate (Exercise) 114 bpm 127 bpm --     Heart Rate (Exit) 83 bpm 96 bpm --     Rating of  Perceived Exertion (Exercise) 10 8 --     Perceived Dyspnea (Exercise) 0 0 --     Symptoms 0 0 --     Comments Pt first day in the CRP2 program Reviewed MET's, goals and home ExRx --     Duration Progress to 30 minutes of  aerobic without signs/symptoms of physical distress Progress to 30 minutes of  aerobic without signs/symptoms of physical distress --     Intensity THRR unchanged THRR unchanged --       Progression   Progression Continue to progress workloads to maintain intensity without signs/symptoms of physical distress. Continue to progress workloads to maintain intensity without signs/symptoms of physical distress. --     Average METs 2.75 2.95 --       Resistance Training   Training Prescription No No --       NuStep   Level 2 3 --     SPM 75 95 --     Minutes 15 15 --     METs 2.4 2.8 --       Track   Laps 18 18 --     Minutes 15 15 --     METs 3.09 3.09 --       Home Exercise Plan   Plans to continue exercise at -- [employer] (comment) --     Frequency -- Add 2 additional days to program exercise sessions. --     Initial Home Exercises Provided -- 09/25/21 --              Exercise Comments:   Exercise Comments     Row Name 09/04/21 1628 09/25/21 1625         Exercise Comments Pt first day in the CRP2 program. Pt tolerated exercise well with an average MET level of 2.75. Pt is learning her THRR, RPE and ExRx Reviewed MET's, goals and home ExRx. Pt tolerated exercise well with an average MET level of 2.95. Pt feels good about her goals of gaining strength and stamina and is learning on training her mindset to acept that she is doing better. Pt also now knows her limits to exercise and will return to [employer] and has had consult with RD which were goals. Pt will exercise on her own 1-2 days a week for 30-45 mins per session as she is able with family obligations by walking and going to [employer]               Exercise Goals and  Review:   Exercise Goals     Row Name 08/13/21 1359             Exercise Goals   Increase Physical Activity Yes       Intervention Provide advice, education, support and counseling about physical activity/exercise needs.;Develop an individualized exercise prescription for aerobic and resistive training based on initial evaluation findings, risk stratification, comorbidities and participant's personal goals.       Expected Outcomes Short Term: Attend rehab on a regular basis to increase amount of physical activity.;Long Term: Exercising regularly at least 3-5 days a week.;Long Term: Add in home exercise to make exercise part of routine and to increase amount of physical activity.       Increase Strength and Stamina Yes  Intervention Provide advice, education, support and counseling about physical activity/exercise needs.;Develop an individualized exercise prescription for aerobic and resistive training based on initial evaluation findings, risk stratification, comorbidities and participant's personal goals.       Expected Outcomes Short Term: Increase workloads from initial exercise prescription for resistance, speed, and METs.;Short Term: Perform resistance training exercises routinely during rehab and add in resistance training at home;Long Term: Improve cardiorespiratory fitness, muscular endurance and strength as measured by increased METs and functional capacity (6MWT)       Able to understand and use rate of perceived exertion (RPE) scale Yes       Intervention Provide education and explanation on how to use RPE scale       Expected Outcomes Short Term: Able to use RPE daily in rehab to express subjective intensity level;Long Term:  Able to use RPE to guide intensity level when exercising independently       Knowledge and understanding of Target Heart Rate Range (THRR) Yes       Intervention Provide education and explanation of THRR including how the numbers were predicted and where  they are located for reference       Expected Outcomes Short Term: Able to state/look up THRR;Long Term: Able to use THRR to govern intensity when exercising independently;Short Term: Able to use daily as guideline for intensity in rehab       Able to check pulse independently Yes       Intervention Provide education and demonstration on how to check pulse in carotid and radial arteries.;Review the importance of being able to check your own pulse for safety during independent exercise       Expected Outcomes Short Term: Able to explain why pulse checking is important during independent exercise;Long Term: Able to check pulse independently and accurately       Understanding of Exercise Prescription Yes       Intervention Provide education, explanation, and written materials on patient's individual exercise prescription       Expected Outcomes Short Term: Able to explain program exercise prescription;Long Term: Able to explain home exercise prescription to exercise independently                Exercise Goals Re-Evaluation :  Exercise Goals Re-Evaluation     Row Name 09/04/21 1626 09/25/21 1621           Exercise Goal Re-Evaluation   Exercise Goals Review Increase Physical Activity;Increase Strength and Stamina;Able to understand and use rate of perceived exertion (RPE) scale;Knowledge and understanding of Target Heart Rate Range (THRR);Understanding of Exercise Prescription Increase Physical Activity;Increase Strength and Stamina;Able to understand and use rate of perceived exertion (RPE) scale;Knowledge and understanding of Target Heart Rate Range (THRR);Understanding of Exercise Prescription      Comments Pt first day in the CRP2 program. Pt tolerated exercise well with an average MET level of 2.75. Pt is learning her THRR, RPE and ExRx Reviewed MET's, goals and home ExRx. Pt tolerated exercise well with an average MET level of 2.95. Pt feels good about her goals of gaining strength and  stamina and is learning on training her mindset to acept that she is doing better. Pt also now knows her limits to exercise and will return to [employer] and has had consult with RD which were goals. Pt will exercise on her own 1-2 days a week for 30-45 mins per session as she is able with family obligations by walking and going to [employer]  Expected Outcomes Will continue to monitor pt and progress workloads as tolerated without sign or symptom Pt will continue to exercise on her own 1-2 days for 30-45 mins per session. Will continue to monitor pt and progress workloads as tolerated without sign or symptom               Discharge Exercise Prescription (Final Exercise Prescription Changes):  Exercise Prescription Changes - 10/07/21 1647       Response to Exercise   Blood Pressure (Admit) 109/75    Blood Pressure (Exercise) 130/70    Blood Pressure (Exit) 98/62             Nutrition:  Target Goals: Understanding of nutrition guidelines, daily intake of sodium <15102m, cholesterol <2033m calories 30% from fat and 7% or less from saturated fats, daily to have 5 or more servings of fruits and vegetables.  Biometrics:  Pre Biometrics - 08/13/21 1330       Pre Biometrics   Waist Circumference 37 inches    Hip Circumference 47 inches    Waist to Hip Ratio 0.79 %    Triceps Skinfold 34 mm    % Body Fat 44 %    Grip Strength 18 kg    Flexibility 20.5 in    Single Leg Stand 10.25 seconds             Post Biometrics - 10/07/21 1649        Post  Biometrics   Height 5' 3" (1.6 m)    Weight 80.2 kg    Waist Circumference 36 inches    Hip Circumference 44.5 inches    Waist to Hip Ratio 0.81 %    BMI (Calculated) 31.33    Triceps Skinfold 25 mm    % Body Fat 39.7 %    Grip Strength 21 kg    Flexibility 21.75 in             Nutrition Therapy Plan and Nutrition Goals:  Nutrition Therapy & Goals - 10/08/21 1008       Nutrition Therapy   Diet Heart  Healthy Diet      Personal Nutrition Goals   Nutrition Goal Patient will build a healthy plate to include lean protein/plant protein, fruit, vegetables, whole grains, low fat dairy products as part of a heart healthy diet.   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 She is tracking her intake with [employer] to support her weight loss efforts. Patient with good nutrition knowledge per diet recall. Patient with considerable improvements to body fat percentage and decreased inches of arms and hips.      Intervention Plan   Intervention Prescribe, educate and counsel regarding individualized specific dietary modifications aiming towards targeted core components such as weight, hypertension, lipid management, diabetes, heart failure and other comorbidities.    Expected Outcomes Short Term Goal: Understand basic principles of dietary content, such as calories, fat, sodium, cholesterol and nutrients.;Long Term Goal: Adherence to prescribed nutrition plan.             Nutrition Assessments:  Nutrition Assessments - 08/20/21 [id number]  Rate Your Plate Scores   Pre Score 70            MEDIFICTS Score Key: ?70 Need to make dietary changes  40-70 Heart Healthy Diet ? 40 Therapeutic Level Cholesterol Diet   Flowsheet Row CARDIAC REHAB PHASE II EXERCISE from 08/19/2021 in [facility]  Picture Your Plate Total Score on Admission 70      Picture Your Plate Scores: <16 Unhealthy dietary pattern with much room for improvement. 41-50 Dietary pattern unlikely to meet recommendations for good health and room for  improvement. 51-60 More healthful dietary pattern, with some room for improvement.  >60 Healthy dietary pattern, although there may be some specific behaviors that could be improved.    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 She is tracking her intake with [employer] to support her weight loss efforts. Patient with good nutrition knowledge per diet recall. Patient with considerable improvements to body fat percentage and decreased inches of arms and hips. Patient down 10# since starting with our program. Gave counseling resources for additonal support longterm.                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 She is tracking her intake with [employer] to support her weight loss efforts. Patient with good nutrition knowledge per diet recall. Patient with considerable improvements to body fat percentage and decreased inches of arms and hips. Patient down 10# since starting with our program. Gave counseling resources for additonal support longterm.                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 She is tracking her intake with [employer] to support her weight loss efforts. Patient with good nutrition knowledge per diet  recall. Patient with considerable improvements to body fat percentage and decreased inches of arms and hips. Patient down 10# since starting with our program. Gave counseling resources for additonal support longterm.             Psychosocial: Target Goals: Acknowledge presence or absence of significant depression and/or stress, maximize coping skills, provide positive support system. Participant is able to verbalize types and ability to use techniques and skills needed for reducing stress and depression.  Initial Review & Psychosocial Screening:  Initial Psych Review & Screening - 08/13/21 1735       Initial Review   Current issues with History of Depression;Current Depression;Current Stress Concerns    Source of Stress Concerns Chronic Illness;Family;Unable to perform yard/household activities    Comments [place]  says she is experiencing some depression she hopes to return to work or go to school in the near future. Diamonds is taking an antidepressant currently      Family Dynamics   Good Support System? Yes   Era has her husband and son for support     Barriers   Psychosocial barriers to participate in program The patient should benefit from training in stress management and relaxation.      Screening Interventions   Interventions Encouraged to exercise;To provide support and resources with identified psychosocial needs    Expected Outcomes Long Term Goal: Stressors or current issues are controlled or eliminated.;Short Term goal: Identification and review with participant of any Quality of Life or Depression concerns found by scoring the questionnaire.;Long Term goal: The participant improves quality of Life and PHQ9 Scores as seen by post scores and/or verbalization of changes             Quality of Life Scores:  Quality of Life - 08/13/21 1613       Quality of Life   Select Quality of Life      Quality of Life Scores   Health/Function Pre 22.4 %    Socioeconomic  Pre 16.07 %    Psych/Spiritual Pre 23.14 %    Family Pre 14.4 %    GLOBAL Pre 20.07 %            Scores of 19 and below usually indicate a poorer quality of life in these areas.  A difference of  2-3 points is a clinically meaningful difference.  A difference of 2-3 points in the total score of the Quality of Life Index has been associated with significant improvement in overall quality of life, self-image, physical symptoms, and general health in studies assessing change in quality of life.  PHQ-9: Review Flowsheet       08/19/2021 08/13/2021  Depression screen PHQ 2/9  Decreased Interest 0 0  Down, Depressed, Hopeless 0 1  PHQ - 2 Score 0 1   Interpretation of Total Score  Total Score Depression Severity:  1-4 = Minimal depression, 5-9 = Mild depression, 10-14 = Moderate depression, 15-19 = Moderately severe depression, 20-27 = Severe depression   Psychosocial Evaluation and Intervention:   Psychosocial Re-Evaluation:  Psychosocial Re-Evaluation     [place] Name 09/06/21 0723 09/18/21 1423 10/10/21 1540         Psychosocial Re-Evaluation   Current issues with Current Stress Concerns;History of Depression;Current Depression;Current Anxiety/Panic Current Depression;History of Depression;Current Stress Concerns;Current Anxiety/Panic Current Depression;History of Depression;Current Stress Concerns;Current Anxiety/Panic     Comments Will review Lajune's quality of life in the upcoming sessions Reviewed quality of life questionnaire with the patient. Would benefit from counselling. QOL forwarded to primary care as Storm does not have a primary care provider.  Taylorann has a lot of family stressors Jaynia has a lot of family stressors. Still reccomeding that Ange would benefit from counselling.     Expected Outcomes Malaiyah will have decreased depression upon completion of phase 2 cardiac rehab. Larose's depressioon, anxiety and stressors will be controlled. Che will seek  counseling Cella's depressioon, anxiety and stressors will be controlled. Micala will seek counseling     Interventions Encouraged to attend Cardiac Rehabilitation for the exercise;Stress management education;Relaxation education Encouraged to attend Cardiac Rehabilitation for the exercise;Stress management education;Encouraged to attend Pulmonary Rehabilitation for the exercise;Relaxation education Encouraged to attend Cardiac Rehabilitation for the exercise;Stress management education;Encouraged to attend Pulmonary Rehabilitation for the exercise;Relaxation education  Continue Psychosocial Services  Follow up required by staff Follow up required by staff Follow up required by staff       Initial Review   Source of Stress Concerns Family;Unable to participate in former interests or hobbies;Unable to perform yard/household activities;Chronic Illness Chronic Illness;Family;Poor Coping Skills;Retirement/disability Chronic Illness;Family;Poor Coping Skills;Retirement/disability     Comments Will continue to monitor and offer support as needed Will continue to monitor the patient and offer support as needed Will continue to monitor the patient and offer support as needed              Psychosocial Discharge (Final Psychosocial Re-Evaluation):  Psychosocial Re-Evaluation - 10/10/21 1540       Psychosocial Re-Evaluation   Current issues with Current Depression;History of Depression;Current Stress Concerns;Current Anxiety/Panic    Comments Danelle has a lot of family stressors. Still reccomeding that Catilyn would benefit from counselling.    Expected Outcomes Tamu's depressioon, anxiety and stressors will be controlled. Charolette will seek counseling    Interventions Encouraged to attend Cardiac Rehabilitation for the exercise;Stress management education;Encouraged to attend Pulmonary Rehabilitation for the exercise;Relaxation education    Continue Psychosocial Services  Follow up required by  staff      Initial Review   Source of Stress Concerns Chronic Illness;Family;Poor Coping Skills;Retirement/disability    Comments Will continue to monitor the patient and offer support as needed             Vocational Rehabilitation: Provide vocational rehab assistance to qualifying candidates.   Vocational Rehab Evaluation & Intervention:  Vocational Rehab - 09/18/21 1428       Initial Vocational Rehab Evaluation & Intervention   Assessment shows need for Vocational Rehabilitation Yes    Vocational Rehab Packet given to patient 09/16/21      Vocational Rehab Re-[facility]   Comments Patient will review vocational rehab and let us know iif she wants to proceed with the referral             Education: Education Goals: Education classes will be provided on a weekly basis, covering required topics. Participant will state understanding/return demonstration of topics presented.     Core Videos: Exercise    Move It!  Clinical staff conducted group or individual video education with verbal and written material and guidebook.  Patient learns the recommended [place] exercise program. Exercise with the goal of living a long, healthy life. Some of the health benefits of exercise include controlled diabetes, healthier blood pressure levels, improved cholesterol levels, improved heart and lung capacity, improved sleep, and better body composition. Everyone should speak with their doctor before starting or changing an exercise routine.  Biomechanical Limitations Clinical staff conducted group or individual video education with verbal and written material and guidebook.  Patient learns how biomechanical limitations can impact exercise and how we can mitigate and possibly overcome limitations to have an impactful and balanced exercise routine.  Body Composition Clinical staff conducted group or individual video education with verbal and written material and guidebook.  Patient  learns that body composition (ratio of muscle mass to fat mass) is a key component to assessing overall fitness, rather than body weight alone. Increased fat mass, especially visceral belly fat, can put [place] at increased risk for metabolic syndrome, type 2 diabetes, heart disease, and even death. It is recommended to combine diet and exercise (cardiovascular and resistance training) to improve your body composition. Seek guidance from your physician and exercise physiologist before implementing an exercise routine.  Exercise Action Plan Clinical  staff conducted group or individual video education with verbal and written material and guidebook.  Patient learns the recommended strategies to achieve and enjoy long-term exercise adherence, including variety, self-motivation, self-efficacy, and positive decision making. Benefits of exercise include fitness, good health, weight management, more energy, better sleep, less stress, and overall well-being.  Medical   Heart Disease Risk Reduction Clinical staff conducted group or individual video education with verbal and written material and guidebook.  Patient learns our heart is our most vital organ as it circulates oxygen, nutrients, white blood cells, and hormones throughout the entire body, and carries waste away. Data supports a plant-based eating plan like the Pritikin Program for its effectiveness in slowing progression of and reversing heart disease. The video provides a number of recommendations to address heart disease.   Metabolic Syndrome and Belly Fat  Clinical staff conducted group or individual video education with verbal and written material and guidebook.  Patient learns what metabolic syndrome is, how it leads to heart disease, and how one can reverse it and keep it from coming back. You have metabolic syndrome if you have 3 of the following 5 criteria: abdominal obesity, high blood pressure, high triglycerides, low HDL cholesterol, and high  blood sugar.  Hypertension and Heart Disease Clinical staff conducted group or individual video education with verbal and written material and guidebook.  Patient learns that high blood pressure, or hypertension, is very common in the [place]. Hypertension is largely due to excessive salt intake, but other important risk factors include being overweight, physical inactivity, drinking too much alcohol, smoking, and not eating enough potassium from fruits and vegetables. High blood pressure is a leading risk factor for heart attack, stroke, congestive heart failure, dementia, kidney failure, and premature death. Long-term effects of excessive salt intake include stiffening of the arteries and thickening of heart muscle and organ damage. Recommendations include ways to reduce hypertension and the risk of heart disease.  Diseases of Our Time - Focusing on Diabetes Clinical staff conducted group or individual video education with verbal and written material and guidebook.  Patient learns why the best way to stop diseases of our time is prevention, through food and other lifestyle changes. Medicine (such as prescription pills and surgeries) is often only a Band-Aid on the problem, not a long-term solution. Most common diseases of our time include obesity, type 2 diabetes, hypertension, heart disease, and cancer. The Pritikin Program is recommended and has been proven to help reduce, reverse, and/or prevent the damaging effects of metabolic syndrome.  Nutrition   Overview of the Pritikin Eating Plan  Clinical staff conducted group or individual video education with verbal and written material and guidebook.  Patient learns about the [place] for disease risk reduction. The [place] emphasizes a wide variety of unrefined, minimally-processed carbohydrates, like fruits, vegetables, whole grains, and legumes. Go, Caution, and Stop food choices are explained. Plant-based and lean  animal proteins are emphasized. Rationale provided for low sodium intake for blood pressure control, low added sugars for blood sugar stabilization, and low added fats and oils for coronary artery disease risk reduction and weight management.  Calorie Density  Clinical staff conducted group or individual video education with verbal and written material and guidebook.  Patient learns about calorie density and how it impacts the Pritikin Eating Plan. Knowing the characteristics of the food you choose will help you decide whether those foods will lead to weight gain or weight loss, and whether you want to consume  more or less of them. Weight loss is usually a side effect of the Pritikin Eating Plan because of its focus on low calorie-dense foods.  Label Reading  Clinical staff conducted group or individual video education with verbal and written material and guidebook.  Patient learns about the Pritikin recommended label reading guidelines and corresponding recommendations regarding calorie density, added sugars, sodium content, and whole grains.  Dining Out - Part 1  Clinical staff conducted group or individual video education with verbal and written material and guidebook.  Patient learns that restaurant meals can be sabotaging because they can be so high in calories, fat, sodium, and/or sugar. Patient learns recommended strategies on how to positively address this and avoid unhealthy pitfalls.  Facts on Fats  Clinical staff conducted group or individual video education with verbal and written material and guidebook.  Patient learns that lifestyle modifications can be just as effective, if not more so, as many medications for lowering your risk of heart disease. A Pritikin lifestyle can help to reduce your risk of inflammation and atherosclerosis (cholesterol build-up, or plaque, in the artery walls). Lifestyle interventions such as dietary choices and physical activity address the cause of  atherosclerosis. A review of the types of fats and their impact on blood cholesterol levels, along with dietary recommendations to reduce fat intake is also included.  Nutrition Action Plan  Clinical staff conducted group or individual video education with verbal and written material and guidebook.  Patient learns how to incorporate Pritikin recommendations into their lifestyle. Recommendations include planning and keeping personal health goals in mind as an important part of their success.  Healthy Mind-Set    Healthy Minds, Bodies, Hearts  Clinical staff conducted group or individual video education with verbal and written material and guidebook.  Patient learns how to identify when they are stressed. Video will discuss the impact of that stress, as well as the many benefits of stress management. Patient will also be introduced to stress management techniques. The way we think, act, and feel has an impact on our hearts.  How Our Thoughts Can Heal Our Hearts  Clinical staff conducted group or individual video education with verbal and written material and guidebook.  Patient learns that negative thoughts can cause depression and anxiety. This can result in negative lifestyle behavior and serious health problems. Cognitive behavioral therapy is an effective method to help control our thoughts in order to change and improve our emotional outlook.  Additional Videos:  Exercise    Improving Performance  Clinical staff conducted group or individual video education with verbal and written material and guidebook.  Patient learns to use a non-linear approach by alternating intensity levels and lengths of time spent exercising to help burn more calories and lose more body fat. Cardiovascular exercise helps improve heart health, metabolism, hormonal balance, blood sugar control, and recovery from fatigue. Resistance training improves strength, endurance, balance, coordination, reaction time, metabolism,  and muscle mass. Flexibility exercise improves circulation, posture, and balance. Seek guidance from your physician and exercise physiologist before implementing an exercise routine and learn your capabilities and proper form for all exercise.  Introduction to Yoga  Clinical staff conducted group or individual video education with verbal and written material and guidebook.  Patient learns about yoga, a discipline of the coming together of mind, breath, and body. The benefits of yoga include improved flexibility, improved range of motion, better posture and core strength, increased lung function, weight loss, and positive self-image. Yoga's heart health benefits include lowered  blood pressure, healthier heart rate, decreased cholesterol and triglyceride levels, improved immune function, and reduced stress. Seek guidance from your physician and exercise physiologist before implementing an exercise routine and learn your capabilities and proper form for all exercise.  Medical   Aging: Enhancing Your Quality of Life  Clinical staff conducted group or individual video education with verbal and written material and guidebook.  Patient learns key strategies and recommendations to stay in good physical health and enhance quality of life, such as prevention strategies, having an advocate, securing a [place], and keeping a list of medications and system for tracking them. It also discusses how to avoid risk for bone loss.  Biology of Weight Control  Clinical staff conducted group or individual video education with verbal and written material and guidebook.  Patient learns that weight gain occurs because we consume more calories than we burn (eating more, moving less). Even if your body weight is normal, you may have higher ratios of fat compared to muscle mass. Too much body fat puts you at increased risk for cardiovascular disease, heart attack, stroke, type 2 diabetes, and  obesity-related cancers. In addition to exercise, following the [place] can help reduce your risk.  Decoding Lab Results  Clinical staff conducted group or individual video education with verbal and written material and guidebook.  Patient learns that lab test reflects one measurement whose values change over time and are influenced by many factors, including medication, stress, sleep, exercise, food, hydration, pre-existing medical conditions, and more. It is recommended to use the knowledge from this video to become more involved with your lab results and evaluate your numbers to speak with your doctor.   Diseases of Our Time - Overview  Clinical staff conducted group or individual video education with verbal and written material and guidebook.  Patient learns that according to the CDC, 50% to 70% of chronic diseases (such as obesity, type 2 diabetes, elevated lipids, hypertension, and heart disease) are avoidable through lifestyle improvements including healthier food choices, listening to satiety cues, and increased physical activity.  Sleep Disorders Clinical staff conducted group or individual video education with verbal and written material and guidebook.  Patient learns how good quality and duration of sleep are important to overall health and well-being. Patient also learns about sleep disorders and how they impact health along with recommendations to address them, including discussing with a physician.  Nutrition  Dining Out - Part 2 Clinical staff conducted group or individual video education with verbal and written material and guidebook.  Patient learns how to plan ahead and communicate in order to maximize their dining experience in a healthy and nutritious manner. Included are recommended food choices based on the type of restaurant the patient is visiting.   Fueling a Best boy conducted group or individual video education with verbal and written  material and guidebook.  There is a strong connection between our food choices and our health. Diseases like obesity and type 2 diabetes are very prevalent and are in large-part due to lifestyle choices. The Pritikin Eating Plan provides plenty of food and hunger-curbing satisfaction. It is easy to follow, affordable, and helps reduce health risks.  Menu Workshop  Clinical staff conducted group or individual video education with verbal and written material and guidebook.  Patient learns that restaurant meals can sabotage health goals because they are often packed with calories, fat, sodium, and sugar. Recommendations include strategies to plan ahead and  to communicate with the manager, chef, or server to help order a healthier meal.  Planning Your Eating Strategy  Clinical staff conducted group or individual video education with verbal and written material and guidebook.  Patient learns about the [place] and its benefit of reducing the risk of disease. The [place] does not focus on calories. Instead, it emphasizes high-quality, nutrient-rich foods. By knowing the characteristics of the foods, we choose, we can determine their calorie density and make informed decisions.  Targeting Your Nutrition Priorities  Clinical staff conducted group or individual video education with verbal and written material and guidebook.  Patient learns that lifestyle habits have a tremendous impact on disease risk and progression. This video provides eating and physical activity recommendations based on your personal health goals, such as reducing LDL cholesterol, losing weight, preventing or controlling type 2 diabetes, and reducing high blood pressure.  Vitamins and Minerals  Clinical staff conducted group or individual video education with verbal and written material and guidebook.  Patient learns different ways to obtain key vitamins and minerals, including through a recommended healthy diet.  It is important to discuss all supplements you take with your doctor.   Healthy Mind-Set    Smoking Cessation  Clinical staff conducted group or individual video education with verbal and written material and guidebook.  Patient learns that cigarette smoking and tobacco addiction pose a serious health risk which affects millions of people. Stopping smoking will significantly reduce the risk of heart disease, lung disease, and many forms of cancer. Recommended strategies for quitting are covered, including working with your doctor to develop a successful plan.  Culinary   Becoming a Financial trader conducted group or individual video education with verbal and written material and guidebook.  Patient learns that cooking at home can be healthy, cost-effective, quick, and puts them in control. Keys to cooking healthy recipes will include looking at your recipe, assessing your equipment needs, planning ahead, making it simple, choosing cost-effective seasonal ingredients, and limiting the use of added fats, salts, and sugars.  Cooking - Breakfast and Snacks  Clinical staff conducted group or individual video education with verbal and written material and guidebook.  Patient learns how important breakfast is to satiety and nutrition through the entire day. Recommendations include key foods to eat during breakfast to help stabilize blood sugar levels and to prevent overeating at meals later in the day. Planning ahead is also a key component.  Cooking - Human resources officer conducted group or individual video education with verbal and written material and guidebook.  Patient learns eating strategies to improve overall health, including an approach to cook more at home. Recommendations include thinking of animal protein as a side on your plate rather than center stage and focusing instead on lower calorie dense options like vegetables, fruits, whole grains, and plant-based  proteins, such as beans. Making sauces in large quantities to freeze for later and leaving the skin on your vegetables are also recommended to maximize your experience.  Cooking - Healthy Salads and Dressing Clinical staff conducted group or individual video education with verbal and written material and guidebook.  Patient learns that vegetables, fruits, whole grains, and legumes are the foundations of the [place]. Recommendations include how to incorporate each of these in flavorful and healthy salads, and how to create homemade salad dressings. Proper handling of ingredients is also covered. Cooking - Soups and Desserts  Cooking - Soups and Desserts  Clinical staff conducted group or individual video education with verbal and written material and guidebook.  Patient learns that Pritikin soups and desserts make for easy, nutritious, and delicious snacks and meal components that are low in sodium, fat, sugar, and calorie density, while high in vitamins, minerals, and filling fiber. Recommendations include simple and healthy ideas for soups and desserts.   Overview     The Pritikin Solution Program Overview Clinical staff conducted group or individual video education with verbal and written material and guidebook.  Patient learns that the results of the [place] Program have been documented in more than 100 articles published in peer-reviewed journals, and the benefits include reducing risk factors for (and, in some cases, even reversing) high cholesterol, high blood pressure, type 2 diabetes, obesity, and more! An overview of the three key pillars of the Pritikin Program will be covered: eating well, doing regular exercise, and having a healthy mind-set.  WORKSHOPS  Exercise: Exercise Basics: Building Your Action Plan Clinical staff led group instruction and group discussion with PowerPoint presentation and patient guidebook. To enhance the learning environment the use of posters,  models and videos may be added. At the conclusion of this workshop, patients will comprehend the difference between physical activity and exercise, as well as the benefits of incorporating both, into their routine. Patients will understand the FITT (Frequency, Intensity, Time, and Type) principle and how to use it to build an exercise action plan. In addition, safety concerns and other considerations for exercise and cardiac rehab will be addressed by the presenter. The purpose of this lesson is to promote a comprehensive and effective weekly exercise routine in order to improve patients' overall level of fitness.   Managing Heart Disease: Your Path to a Healthier Heart Clinical staff led group instruction and group discussion with PowerPoint presentation and patient guidebook. To enhance the learning environment the use of posters, models and videos may be added.At the conclusion of this workshop, patients will understand the anatomy and physiology of the heart. Additionally, they will understand how Pritikin's three pillars impact the risk factors, the progression, and the management of heart disease.  The purpose of this lesson is to provide a high-level overview of the heart, heart disease, and how the Pritikin lifestyle positively impacts risk factors.  Exercise Biomechanics Clinical staff led group instruction and group discussion with PowerPoint presentation and patient guidebook. To enhance the learning environment the use of posters, models and videos may be added. Patients will learn how the structural parts of their bodies function and how these functions impact their daily activities, movement, and exercise. Patients will learn how to promote a neutral spine, learn how to manage pain, and identify ways to improve their physical movement in order to promote healthy living. The purpose of this lesson is to expose patients to common physical limitations that impact physical activity.  Participants will learn practical ways to adapt and manage aches and pains, and to minimize their effect on regular exercise. Patients will learn how to maintain good posture while sitting, walking, and lifting.  Balance Training and Fall Prevention  Clinical staff led group instruction and group discussion with PowerPoint presentation and patient guidebook. To enhance the learning environment the use of posters, models and videos may be added. At the conclusion of this workshop, patients will understand the importance of their sensorimotor skills (vision, proprioception, and the vestibular system) in maintaining their ability to balance as they age. Patients will apply a variety of balancing exercises that are  appropriate for their current level of function. Patients will understand the common causes for poor balance, possible solutions to these problems, and ways to modify their physical environment in order to minimize their fall risk. The purpose of this lesson is to teach patients about the importance of maintaining balance as they age and ways to minimize their risk of falling.  WORKSHOPS   Nutrition:  Fueling a Scientist, research (physical sciences) led group instruction and group discussion with PowerPoint presentation and patient guidebook. To enhance the learning environment the use of posters, models and videos may be added. Patients will review the foundational principles of the [place] and understand what constitutes a serving size in each of the food groups. Patients will also learn Pritikin-friendly foods that are better choices when away from home and review make-ahead meal and snack options. Calorie density will be reviewed and applied to three nutrition priorities: weight maintenance, weight loss, and weight gain. The purpose of this lesson is to reinforce (in a group setting) the key concepts around what patients are recommended to eat and how to apply these guidelines when away  from home by planning and selecting Pritikin-friendly options. Patients will understand how calorie density may be adjusted for different weight management goals.  Mindful Eating  Clinical staff led group instruction and group discussion with PowerPoint presentation and patient guidebook. To enhance the learning environment the use of posters, models and videos may be added. Patients will briefly review the concepts of the [place] and the importance of low-calorie dense foods. The concept of mindful eating will be introduced as well as the importance of paying attention to internal hunger signals. Triggers for non-hunger eating and techniques for dealing with triggers will be explored. The purpose of this lesson is to provide patients with the opportunity to review the basic principles of the [place], discuss the value of eating mindfully and how to measure internal cues of hunger and fullness using the Hunger Scale. Patients will also discuss reasons for non-hunger eating and learn strategies to use for controlling emotional eating.  Targeting Your Nutrition Priorities Clinical staff led group instruction and group discussion with PowerPoint presentation and patient guidebook. To enhance the learning environment the use of posters, models and videos may be added. Patients will learn how to determine their genetic susceptibility to disease by reviewing their family history. Patients will gain insight into the importance of diet as part of an overall healthy lifestyle in mitigating the impact of genetics and other environmental insults. The purpose of this lesson is to provide patients with the opportunity to assess their personal nutrition priorities by looking at their family history, their own health history and current risk factors. Patients will also be able to discuss ways of prioritizing and modifying the [place] for their highest risk areas  Menu  Clinical  staff led group instruction and group discussion with PowerPoint presentation and patient guidebook. To enhance the learning environment the use of posters, models and videos may be added. Using menus brought in from [employer], or printed from [employer], patients will apply the [place] dining out guidelines that were presented in the [employer] video. Patients will also be able to practice these guidelines in a variety of provided scenarios. The purpose of this lesson is to provide patients with the opportunity to practice hands-on learning of the [place] with actual menus and practice scenarios.  Label Reading Clinical staff led  group instruction and group discussion with PowerPoint presentation and patient guidebook. To enhance the learning environment the use of posters, models and videos may be added. Patients will review and discuss the [employer] label reading guidelines presented in [employer]'s Label Reading Educational series video. Using fool labels brought in from local grocery stores and markets, patients will apply the label reading guidelines and determine if the packaged food meet the [employer] guidelines. The purpose of this lesson is to provide patients with the opportunity to review, discuss, and practice hands-on learning of the [employer] Label Reading guidelines with actual packaged food labels. [place] Workshops are designed to teach patients ways to prepare quick, simple, and affordable recipes at home. The importance of nutrition's role in chronic disease risk reduction is reflected in its emphasis in the overall [employer] program. By learning how to prepare essential core [employer] Eating Plan recipes, patients will increase control over what they eat; be able to customize the flavor of foods without the use of added salt, sugar, or fat; and improve the quality of the food they consume. By learning a set of  core recipes which are easily assembled, quickly prepared, and affordable, patients are more likely to prepare more healthy foods at home. These workshops focus on convenient breakfasts, simple entres, side dishes, and desserts which can be prepared with minimal effort and are consistent with nutrition recommendations for cardiovascular risk reduction. Cooking [employer] are taught by a Engineer, materials (RD) who has been trained by the [employer]. The chef or RD has a clear understanding of the importance of minimizing - if not completely eliminating - added fat, sugar, and sodium in recipes. Throughout the series of [place] Workshop sessions, patients will learn about healthy ingredients and efficient methods of cooking to build confidence in their capability to prepare    Cooking School weekly topics:  Adding Flavor- Sodium-Free  Fast and Healthy Breakfasts  Powerhouse Plant-Based Proteins  Satisfying Salads and Dressings  Simple Sides and Sauces  International Cuisine-Spotlight on the [place] Zones  Delicious Desserts  Savory Soups  Efficiency Cooking - Meals in a Snap  Tasty Appetizers and Snacks  Comforting Weekend Breakfasts  One-Pot Wonders   Fast Evening Meals  Easy [place] (Psychosocial): New Thoughts, New Behaviors Clinical staff led group instruction and group discussion with PowerPoint presentation and patient guidebook. To enhance the learning environment the use of posters, models and videos may be added. Patients will learn and practice techniques for developing effective health and lifestyle goals. Patients will be able to effectively apply the goal setting process learned to develop at least one new personal goal.  The purpose of this lesson is to expose patients to a new skill set of behavior modification techniques such as techniques setting SMART goals, overcoming  barriers, and achieving new thoughts and new behaviors.  Managing Moods and Relationships Clinical staff led group instruction and group discussion with PowerPoint presentation and patient guidebook. To enhance the learning environment the use of posters, models and videos may be added. Patients will learn how emotional and chronic stress factors can impact their health and relationships. They will learn healthy ways to manage their moods and utilize positive coping mechanisms. In addition, ICR patients will learn ways to improve communication skills. The purpose of this lesson is to expose patients to ways of understanding how one's mood and health are intimately connected. Developing  a healthy outlook can help build positive relationships and connections with others. Patients will understand the importance of utilizing effective communication skills that include actively listening and being heard. They will learn and understand the importance of the "4 Cs" and especially Connections in fostering of a Healthy Mind-Set.  Healthy Sleep for a Healthy Heart Clinical staff led group instruction and group discussion with PowerPoint presentation and patient guidebook. To enhance the learning environment the use of posters, models and videos may be added. At the conclusion of this workshop, patients will be able to demonstrate knowledge of the importance of sleep to overall health, well-being, and quality of life. They will understand the symptoms of, and treatments for, common sleep disorders. Patients will also be able to identify daytime and nighttime behaviors which impact sleep, and they will be able to apply these tools to help manage sleep-related challenges. The purpose of this lesson is to provide patients with a general overview of sleep and outline the importance of quality sleep. Patients will learn about a few of the most common sleep disorders. Patients will also be introduced to the concept of "sleep  hygiene," and discover ways to self-manage certain sleeping problems through simple daily behavior changes. Finally, the workshop will motivate patients by clarifying the links between quality sleep and their goals of heart-healthy living.   Recognizing and Reducing Stress Clinical staff led group instruction and group discussion with PowerPoint presentation and patient guidebook. To enhance the learning environment the use of posters, models and videos may be added. At the conclusion of this workshop, patients will be able to understand the types of stress reactions, differentiate between acute and chronic stress, and recognize the impact that chronic stress has on their health. They will also be able to apply different coping mechanisms, such as reframing negative self-talk. Patients will have the opportunity to practice a variety of stress management techniques, such as deep abdominal breathing, progressive muscle relaxation, and/or guided imagery.  The purpose of this lesson is to educate patients on the role of stress in their lives and to provide healthy techniques for coping with it.  Learning Barriers/Preferences:  Learning Barriers/Preferences - 08/13/21 1614       Learning Barriers/Preferences   Learning Barriers None    Learning Preferences Audio;Skilled Demonstration;Computer/Internet;Verbal Instruction;Group Instruction;Video;Individual Instruction;Written Material;Pictoral             Education Topics:  Knowledge Questionnaire Score:  Knowledge Questionnaire Score - 08/13/21 1615       Knowledge Questionnaire Score   Pre Score 22/24             Core Components/Risk Factors/Patient Goals at Admission:  Personal Goals and Risk Factors at Admission - 08/13/21 1623       Core Components/Risk Factors/Patient Goals on Admission    Weight Management Yes;Obesity;Weight Loss    Intervention Weight Management: Develop a combined nutrition and exercise program designed to  reach desired caloric intake, while maintaining appropriate intake of nutrient and fiber, sodium and fats, and appropriate energy expenditure required for the weight goal.;Weight Management: Provide education and appropriate resources to help participant work on and attain dietary goals.;Weight Management/Obesity: Establish reasonable short term and long term weight goals.;Obesity: Provide education and appropriate resources to help participant work on and attain dietary goals.    Admit Weight 186 lb 15.2 oz (84.8 kg)    Expected Outcomes Short Term: Continue to assess and modify interventions until short term weight is achieved;Long Term: Adherence to nutrition and physical activity/exercise  program aimed toward attainment of established weight goal;Weight Maintenance: Understanding of the daily nutrition guidelines, which includes 25-35% calories from fat, 7% or less cal from saturated fats, less than 238m cholesterol, less than 1.5gm of sodium, & 5 or more servings of fruits and vegetables daily;Weight Loss: Understanding of general recommendations for a balanced deficit meal plan, which promotes 1-2 lb weight loss per week and includes a negative energy balance of [phone] kcal/d;Understanding recommendations for meals to include 15-35% energy as protein, 25-35% energy from fat, 35-60% energy from carbohydrates, less than 2080mof dietary cholesterol, 20-35 gm of total fiber daily;Understanding of distribution of calorie intake throughout the day with the consumption of 4-5 meals/snacks    Heart Failure Yes    Intervention Provide a combined exercise and nutrition program that is supplemented with education, support and counseling about heart failure. Directed toward relieving symptoms such as shortness of breath, decreased exercise tolerance, and extremity edema.    Expected Outcomes Improve functional capacity of life;Short term: Attendance in program 2-3 days a week with increased exercise capacity.  Reported lower sodium intake. Reported increased fruit and vegetable intake. Reports medication compliance.;Short term: Daily weights obtained and reported for increase. Utilizing diuretic protocols set by physician.;Long term: Adoption of self-care skills and reduction of barriers for early signs and symptoms recognition and intervention leading to self-care maintenance.    Lipids Yes    Intervention Provide education and support for participant on nutrition & aerobic/resistive exercise along with prescribed medications to achieve LDL <7088mHDL >60m57m  Expected Outcomes Short Term: Participant states understanding of desired cholesterol values and is compliant with medications prescribed. Participant is following exercise prescription and nutrition guidelines.;Long Term: Cholesterol controlled with medications as prescribed, with individualized exercise RX and with personalized nutrition plan. Value goals: LDL < 70mg5mL > 40 mg.             Core Components/Risk Factors/Patient Goals Review:   Goals and Risk Factor Review     Row Name 09/06/21 0727 09/18/21 1431           Core Components/Risk Factors/Patient Goals Review   Personal Goals Review Weight Management/Obesity;Lipids;Heart Failure;Stress Weight Management/Obesity;Lipids;Heart Failure;Stress      Review Arbie started cardiac rehab on 09/06/21. Sheretha did well with exercise LadonGhazalbeen doing well with exercise at cardiac rehab. Resting systolic BP noted in the 90's 01'BcleaClaris Gladdence was notified      Expected Outcomes LadonJawanda continue to participate in phase 2 cardiac rehab for exercise, nutrition and lifestyle modifications LadonAshmi continue to participate in phase 2 cardiac rehab for exercise, nutrition and lifestyle modifications               Core Components/Risk Factors/Patient Goals at Discharge (Final Review):   Goals and Risk Factor Review - 09/18/21 1431       Core Components/Risk  Factors/Patient Goals Review   Personal Goals Review Weight Management/Obesity;Lipids;Heart Failure;Stress    Review LadonDavinabeen doing well with exercise at cardiac rehab. Resting systolic BP noted in the 90's 51'WcleaClaris Gladdence was notified    Expected Outcomes LadonZiyah continue to participate in phase 2 cardiac rehab for exercise, nutrition and lifestyle modifications             ITP Comments:  ITP Comments     Row Name 08/13/21 1346 09/06/21 0722 09/18/21 1422 10/10/21 1539     ITP Comments Medical Director- Dr. TraciFransico Him30 Day ITP Review. LadonDorena Cookey  started exercise at cardiac rehab on 09/04/21 and did well with exercise 30 Day ITP Review. Tailor is off to a good start to exercise at cardiac rehab. 30 Day ITP Review. Cohen will complete phase 2 cardiac rehab on 10/11/21             Comments: See ITP comments.Harrell Gave RN BSN

## 2021-10-11 ENCOUNTER — Encounter (HOSPITAL_COMMUNITY)
Admission: RE | Admit: 2021-10-11 | Discharge: 2021-10-11 | Disposition: A | Discharge status: Home / Self Care | Payer: 59 | Source: Ambulatory Visit | Attending: Cardiology | Admitting: Cardiology

## 2021-10-11 ENCOUNTER — Telehealth: Payer: Self-pay | Admitting: Pharmacist

## 2021-10-11 DIAGNOSIS — I5022 Chronic systolic (congestive) heart failure: Secondary | ICD-10-CM | POA: Diagnosis not present

## 2021-10-11 DIAGNOSIS — I469 Cardiac arrest, cause unspecified: Secondary | ICD-10-CM | POA: Diagnosis not present

## 2021-10-11 NOTE — Telephone Encounter (Signed)
Patient is calling about Ozempic, she is willing to go on the higher dosage than Megan wanted her to go on.  Her insurance changes on 7/1, she is wondering if it can be called in to the pharmacy today.

## 2021-10-11 NOTE — Progress Notes (Signed)
Discharge Progress Report  Patient Details  Name: Kristina Nichols MRN: [id number] Date of Birth: 1965-11-02 Referring Provider:   Flowsheet Row CARDIAC REHAB PHASE II ORIENTATION from 08/13/2021 in [place]  Referring Provider Loralie Champagne, MD        Number of Visits: 11  Reason for Discharge:  Patient reached a stable level of exercise. Patient independent in their exercise. Patient has met program and personal goals.  Smoking History:  Social History   Tobacco Use  Smoking Status Never  Smokeless Tobacco Never    Diagnosis:  Heart failure, chronic systolic ([place])  Cardiac arrest ([place])  ADL UCSD:   Initial Exercise Prescription:  Initial Exercise Prescription - 08/13/21 1600       Date of Initial Exercise RX and Referring Provider   Date 08/13/21    Referring Provider Loralie Champagne, MD    Expected Discharge Date 10/11/21      NuStep   Level 2    SPM 75    Minutes 15    METs 2.4      Track   Laps 12    Minutes 15    METs 2.39      Prescription Details   Frequency (times per week) 3    Duration Progress to 30 minutes of continuous aerobic without signs/symptoms of physical distress      Intensity   THRR 40-80% of Max Heartrate 67-134    Ratings of Perceived Exertion 11-13    Perceived Dyspnea 0-4      Progression   Progression Continue progressive overload as per policy without signs/symptoms or physical distress.      Resistance Training   Training Prescription Yes    Weight 2 lbs    Reps 10-15             Discharge Exercise Prescription (Final Exercise Prescription Changes):  Exercise Prescription Changes - 10/11/21 1630       Response to Exercise   Blood Pressure (Admit) 104/64    Blood Pressure (Exercise) 142/72    Blood Pressure (Exit) 111/73    Heart Rate (Admit) 82 bpm    Heart Rate (Exercise) 133 bpm    Heart Rate (Exit) 82 bpm    Rating of Perceived Exertion (Exercise) 12.5    Perceived  Dyspnea (Exercise) 0    Symptoms 0    Comments Pt graduated the CRP2 program    Duration Progress to 30 minutes of  aerobic without signs/symptoms of physical distress    Intensity THRR unchanged      Progression   Progression Continue to progress workloads to maintain intensity without signs/symptoms of physical distress.    Average METs 2.78      Resistance Training   Training Prescription Yes    Weight 3 lbbs wts    Reps 10-15    Time 10 Minutes      NuStep   Level 3    SPM 95    Minutes 15    METs 2.7      Track   Laps 16    Minutes 15    METs 2.86      Home Exercise Plan   Plans to continue exercise at [facility] (comment)    Frequency Add 2 additional days to program exercise sessions.    Initial Home Exercises Provided 09/25/21             Functional Capacity:  6 Minute Walk     Row Name 08/13/21  1453 10/07/21 1644       6 Minute Walk   Phase Initial Discharge    Distance 1302 feet 1693 feet    Distance % Change -- 30.03 %    Distance Feet Change -- 391 ft    Walk Time 6 minutes 6 minutes    # of Rest Breaks 0 0    MPH 2.5 3.21    METS 3.32 3.98    RPE 7 15    Perceived Dyspnea  0 0    VO2 Peak 11.6 13.92    Symptoms No No    Resting HR 87 bpm 82 bpm    Resting BP 106/73 109/75    Resting Oxygen Saturation  95 % --    Exercise Oxygen Saturation  during 6 min walk 97 % 97 %    Max Ex. HR 97 bpm 82 bpm    Max Ex. BP 114/79 130/70    2 Minute Post BP 94/59 --             Psychological, QOL, Others - Outcomes: PHQ 2/9:    10/11/2021    4:22 PM 08/19/2021    3:54 PM 08/13/2021    5:44 PM  Depression screen PHQ 2/9  Decreased Interest 0 0 0  Down, Depressed, Hopeless 0 0 1  PHQ - 2 Score 0 0 1    Quality of Life:  Quality of Life - 10/11/21 1630       Quality of Life   Select Quality of Life      Quality of Life Scores   Health/Function Post 28.13 %    Socioeconomic Post 20.71 %    Psych/Spiritual Post 28.29 %     Family Post 18 %    GLOBAL Post 25.15 %             Personal Goals: Goals established at orientation with interventions provided to work toward goal.  Personal Goals and Risk Factors at Admission - 08/13/21 1623       Core Components/Risk Factors/Patient Goals on Admission    Weight Management Yes;Obesity;Weight Loss    Intervention Weight Management: Develop a combined nutrition and exercise program designed to reach desired caloric intake, while maintaining appropriate intake of nutrient and fiber, sodium and fats, and appropriate energy expenditure required for the weight goal.;Weight Management: Provide education and appropriate resources to help participant work on and attain dietary goals.;Weight Management/Obesity: Establish reasonable short term and long term weight goals.;Obesity: Provide education and appropriate resources to help participant work on and attain dietary goals.    Admit Weight 186 lb 15.2 oz (84.8 kg)    Expected Outcomes Short Term: Continue to assess and modify interventions until short term weight is achieved;Long Term: Adherence to nutrition and physical activity/exercise program aimed toward attainment of established weight goal;Weight Maintenance: Understanding of the daily nutrition guidelines, which includes 25-35% calories from fat, 7% or less cal from saturated fats, less than 280m cholesterol, less than 1.5gm of sodium, & 5 or more servings of fruits and vegetables daily;Weight Loss: Understanding of general recommendations for a balanced deficit meal plan, which promotes 1-2 lb weight loss per week and includes a negative energy balance of [phone] kcal/d;Understanding recommendations for meals to include 15-35% energy as protein, 25-35% energy from fat, 35-60% energy from carbohydrates, less than 2074mof dietary cholesterol, 20-35 gm of total fiber daily;Understanding of distribution of calorie intake throughout the day with the consumption of 4-5  meals/snacks    Heart Failure  Yes    Intervention Provide a combined exercise and nutrition program that is supplemented with education, support and counseling about heart failure. Directed toward relieving symptoms such as shortness of breath, decreased exercise tolerance, and extremity edema.    Expected Outcomes Improve functional capacity of life;Short term: Attendance in program 2-3 days a week with increased exercise capacity. Reported lower sodium intake. Reported increased fruit and vegetable intake. Reports medication compliance.;Short term: Daily weights obtained and reported for increase. Utilizing diuretic protocols set by physician.;Long term: Adoption of self-care skills and reduction of barriers for early signs and symptoms recognition and intervention leading to self-care maintenance.    Lipids Yes    Intervention Provide education and support for participant on nutrition & aerobic/resistive exercise along with prescribed medications to achieve LDL <21m, HDL >458m    Expected Outcomes Short Term: Participant states understanding of desired cholesterol values and is compliant with medications prescribed. Participant is following exercise prescription and nutrition guidelines.;Long Term: Cholesterol controlled with medications as prescribed, with individualized exercise RX and with personalized nutrition plan. Value goals: LDL < 7020mHDL > 40 mg.              Personal Goals Discharge:  Goals and Risk Factor Review     Row Name 09/06/21 0727 09/18/21 1431 10/10/21 1541         Core Components/Risk Factors/Patient Goals Review   Personal Goals Review Weight Management/Obesity;Lipids;Heart Failure;Stress Weight Management/Obesity;Lipids;Heart Failure;Stress Weight Management/Obesity;Lipids;Heart Failure;Stress     Review Zelena started cardiac rehab on 09/06/21. Cintya did well with exercise LadMeshas been doing well with exercise at cardiac rehab. Resting systolic BP noted  in the 90's Dr MclClaris Gladdenfice was notified LadLaurencias been doing well with exercise at cardiac rehab. Vital signs have been stable. LadNasimll complete phase 2 cardiac rehab. LadRildaill has a lot of family stress regarding her son. LadSilviell complete cardiac rehab on 10/11/21. Attendance has been variable.     Expected Outcomes LadBreanall continue to participate in phase 2 cardiac rehab for exercise, nutrition and lifestyle modifications LadDeavenll continue to participate in phase 2 cardiac rehab for exercise, nutrition and lifestyle modifications LadMayettall continue to participate in phase 2 cardiac rehab for exercise, nutrition and lifestyle modifications              Exercise Goals and Review:  Exercise Goals     Row Name 08/13/21 1359             Exercise Goals   Increase Physical Activity Yes       Intervention Provide advice, education, support and counseling about physical activity/exercise needs.;Develop an individualized exercise prescription for aerobic and resistive training based on initial evaluation findings, risk stratification, comorbidities and participant's personal goals.       Expected Outcomes Short Term: Attend rehab on a regular basis to increase amount of physical activity.;Long Term: Exercising regularly at least 3-5 days a week.;Long Term: Add in home exercise to make exercise part of routine and to increase amount of physical activity.       Increase Strength and Stamina Yes       Intervention Provide advice, education, support and counseling about physical activity/exercise needs.;Develop an individualized exercise prescription for aerobic and resistive training based on initial evaluation findings, risk stratification, comorbidities and participant's personal goals.       Expected Outcomes Short Term: Increase workloads from initial exercise prescription for resistance, speed, and METs.;Short Term: Perform resistance training exercises routinely  during rehab and add in resistance training at home;Long Term: Improve cardiorespiratory fitness, muscular endurance and strength as measured by increased METs and functional capacity (6MWT)       Able to understand and use rate of perceived exertion (RPE) scale Yes       Intervention Provide education and explanation on how to use RPE scale       Expected Outcomes Short Term: Able to use RPE daily in rehab to express subjective intensity level;Long Term:  Able to use RPE to guide intensity level when exercising independently       Knowledge and understanding of Target Heart Rate Range (THRR) Yes       Intervention Provide education and explanation of THRR including how the numbers were predicted and where they are located for reference       Expected Outcomes Short Term: Able to state/look up THRR;Long Term: Able to use THRR to govern intensity when exercising independently;Short Term: Able to use daily as guideline for intensity in rehab       Able to check pulse independently Yes       Intervention Provide education and demonstration on how to check pulse in carotid and radial arteries.;Review the importance of being able to check your own pulse for safety during independent exercise       Expected Outcomes Short Term: Able to explain why pulse checking is important during independent exercise;Long Term: Able to check pulse independently and accurately       Understanding of Exercise Prescription Yes       Intervention Provide education, explanation, and written materials on patient's individual exercise prescription       Expected Outcomes Short Term: Able to explain program exercise prescription;Long Term: Able to explain home exercise prescription to exercise independently                Exercise Goals Re-Evaluation:  Exercise Goals Re-Evaluation     Row Name 09/04/21 1626 09/25/21 1621 10/11/21 1630         Exercise Goal Re-Evaluation   Exercise Goals Review Increase Physical  Activity;Increase Strength and Stamina;Able to understand and use rate of perceived exertion (RPE) scale;Knowledge and understanding of Target Heart Rate Range (THRR);Understanding of Exercise Prescription Increase Physical Activity;Increase Strength and Stamina;Able to understand and use rate of perceived exertion (RPE) scale;Knowledge and understanding of Target Heart Rate Range (THRR);Understanding of Exercise Prescription Increase Physical Activity;Increase Strength and Stamina;Able to understand and use rate of perceived exertion (RPE) scale;Knowledge and understanding of Target Heart Rate Range (THRR);Understanding of Exercise Prescription     Comments Pt first day in the CRP2 program. Pt tolerated exercise well with an average MET level of 2.75. Pt is learning her THRR, RPE and ExRx Reviewed MET's, goals and home ExRx. Pt tolerated exercise well with an average MET level of 2.95. Pt feels good about her goals of gaining strength and stamina and is learning on training her mindset to acept that she is doing better. Pt also now knows her limits to exercise and will return to [employer] and has had consult with RD which were goals. Pt will exercise on her own 1-2 days a week for 30-45 mins per session as she is able with family obligations by walking and going to [employer] Pt graduated the CRP2 program. Pt tolerated exercise well with an average MET level of 2.78. Pt will continue to exercise 3-6 days a week for 30-45 mins per session by walking, and doing the recumbent  stepper and bike at [employer].     Expected Outcomes Will continue to monitor pt and progress workloads as tolerated without sign or symptom Pt will continue to exercise on her own 1-2 days for 30-45 mins per session. Will continue to monitor pt and progress workloads as tolerated without sign or symptom pt will continue to exercise on her own and gain strength              Nutrition & Weight - Outcomes:  Pre Biometrics -  08/13/21 1330       Pre Biometrics   Waist Circumference 37 inches    Hip Circumference 47 inches    Waist to Hip Ratio 0.79 %    Triceps Skinfold 34 mm    % Body Fat 44 %    Grip Strength 18 kg    Flexibility 20.5 in    Single Leg Stand 10.25 seconds             Post Biometrics - 10/11/21 1625        Post  Biometrics   Single Leg Stand 10.39 seconds             Nutrition:  Nutrition Therapy & Goals - 10/08/21 1008       Nutrition Therapy   Diet Heart Healthy Diet      Personal Nutrition Goals   Nutrition Goal Patient will build a healthy plate to include lean protein/plant protein, fruit, vegetables, whole grains, low fat dairy products as part of a heart healthy diet.   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 She is tracking her intake with [employer]itness [employer] to support her weight loss efforts. Patient with good nutrition knowledge per diet recall. Patient with considerable improvements to body fat percentage and decreased inches of arms and hips.      Intervention Plan   Intervention Prescribe, educate and counsel regarding individualized specific dietary modifications aiming towards targeted core components such as weight, hypertension, lipid management, diabetes, heart failure and other comorbidities.    Expected Outcomes Short Term Goal: Understand basic principles of dietary content, such as calories, fat, sodium, cholesterol and nutrients.;Long Term Goal: Adherence to prescribed nutrition plan.             Nutrition Discharge:  Nutrition Assessments - 10/14/21 0955       Rate Your Plate Scores   Post Score  85             Education Questionnaire Score:  Knowledge Questionnaire Score - 10/11/21 1630       Knowledge Questionnaire Score   Post Score 24/24             Goals reviewed with patient; copy given to patient.Pt graduates from  Intensive/Traditional cardiac rehab program today with completion of  11 exercise and education sessions. Pt maintained good attendance and progressed nicely during their participation in rehab as evidenced by increased MET level.   Medication list reconciled. Repeat  PHQ score-  0.  Pt has made significant lifestyle changes and should be commended for their success.  Dhriti achieved her goals during cardiac rehab. Georgeann  increased her distance on her post exercise walk test by 391 feet  Pt plans to continue exercise at [employer] and walking on her own. We are proud of Karon's progress. Jacqualin also has established care with Dr Burnard Bunting who she will see on 10/29/21.Harrell Gave RN BSN

## 2021-10-16 MED ORDER — OZEMPIC (2 MG/DOSE) 8 MG/3ML ~~LOC~~ SOPN: 2.0000 mg | PEN_INJECTOR | SUBCUTANEOUS | 11 refills | Status: DC

## 2021-10-16 NOTE — Telephone Encounter (Signed)
Refill sent in for higher Ozempic 2mg  weekly dose. Left detailed message for pt to make her aware, and for her to call back with any concerns or questions.

## 2021-10-29 ENCOUNTER — Ambulatory Visit (HOSPITAL_BASED_OUTPATIENT_CLINIC_OR_DEPARTMENT_OTHER): Payer: 59 | Admitting: Family Medicine

## 2021-10-31 ENCOUNTER — Encounter (HOSPITAL_BASED_OUTPATIENT_CLINIC_OR_DEPARTMENT_OTHER): Payer: Self-pay | Admitting: Family Medicine

## 2021-11-05 ENCOUNTER — Encounter (HOSPITAL_BASED_OUTPATIENT_CLINIC_OR_DEPARTMENT_OTHER): Payer: Self-pay | Admitting: Family Medicine

## 2021-11-12 ENCOUNTER — Ambulatory Visit (INDEPENDENT_AMBULATORY_CARE_PROVIDER_SITE_OTHER): Payer: 59

## 2021-11-12 DIAGNOSIS — I469 Cardiac arrest, cause unspecified: Secondary | ICD-10-CM | POA: Diagnosis not present

## 2021-11-12 DIAGNOSIS — F902 Attention-deficit hyperactivity disorder, combined type: Secondary | ICD-10-CM | POA: Diagnosis not present

## 2021-11-12 DIAGNOSIS — R69 Illness, unspecified: Secondary | ICD-10-CM | POA: Diagnosis not present

## 2021-11-12 DIAGNOSIS — F411 Generalized anxiety disorder: Secondary | ICD-10-CM | POA: Diagnosis not present

## 2021-11-12 LAB — CUP PACEART REMOTE DEVICE CHECK
Battery Remaining Longevity: 104 mo
Battery Remaining Percentage: 83 %
Battery Voltage: 3.01 V
Brady Statistic RV Percent Paced: 1 %
Date Time Interrogation Session: 20230801034619
HighPow Impedance: 77 Ohm
Implantable Lead Implant Date: 20211102
Implantable Lead Location: 753860
Implantable Pulse Generator Implant Date: 20211102
Lead Channel Impedance Value: 380 Ohm
Lead Channel Pacing Threshold Amplitude: 0.75 V
Lead Channel Pacing Threshold Pulse Width: 0.5 ms
Lead Channel Sensing Intrinsic Amplitude: 10.4 mV
Lead Channel Setting Pacing Amplitude: 2.5 V
Lead Channel Setting Pacing Pulse Width: 0.5 ms
Lead Channel Setting Sensing Sensitivity: 0.5 mV
Pulse Gen Serial Number: 111028702

## 2021-11-26 DIAGNOSIS — H524 Presbyopia: Secondary | ICD-10-CM | POA: Diagnosis not present

## 2021-12-03 ENCOUNTER — Other Ambulatory Visit (HOSPITAL_COMMUNITY): Payer: Self-pay | Admitting: Cardiology

## 2021-12-11 NOTE — Progress Notes (Signed)
Remote ICD transmission.   

## 2021-12-12 DIAGNOSIS — R69 Illness, unspecified: Secondary | ICD-10-CM | POA: Diagnosis not present

## 2021-12-12 DIAGNOSIS — F411 Generalized anxiety disorder: Secondary | ICD-10-CM | POA: Diagnosis not present

## 2021-12-12 DIAGNOSIS — F902 Attention-deficit hyperactivity disorder, combined type: Secondary | ICD-10-CM | POA: Diagnosis not present

## 2022-01-09 DIAGNOSIS — F902 Attention-deficit hyperactivity disorder, combined type: Secondary | ICD-10-CM | POA: Diagnosis not present

## 2022-01-09 DIAGNOSIS — F411 Generalized anxiety disorder: Secondary | ICD-10-CM | POA: Diagnosis not present

## 2022-01-09 DIAGNOSIS — R69 Illness, unspecified: Secondary | ICD-10-CM | POA: Diagnosis not present

## 2022-01-10 DIAGNOSIS — F411 Generalized anxiety disorder: Secondary | ICD-10-CM | POA: Diagnosis not present

## 2022-01-10 DIAGNOSIS — R69 Illness, unspecified: Secondary | ICD-10-CM | POA: Diagnosis not present

## 2022-01-10 DIAGNOSIS — F902 Attention-deficit hyperactivity disorder, combined type: Secondary | ICD-10-CM | POA: Diagnosis not present

## 2022-01-13 DIAGNOSIS — M4316 Spondylolisthesis, lumbar region: Secondary | ICD-10-CM | POA: Diagnosis not present

## 2022-01-13 DIAGNOSIS — M419 Scoliosis, unspecified: Secondary | ICD-10-CM | POA: Diagnosis not present

## 2022-01-13 DIAGNOSIS — M5416 Radiculopathy, lumbar region: Secondary | ICD-10-CM | POA: Diagnosis not present

## 2022-01-17 ENCOUNTER — Other Ambulatory Visit (HOSPITAL_COMMUNITY): Payer: Self-pay | Admitting: Cardiology

## 2022-01-28 ENCOUNTER — Ambulatory Visit (HOSPITAL_COMMUNITY)
Admission: RE | Admit: 2022-01-28 | Discharge: 2022-01-28 | Disposition: A | Discharge status: Home / Self Care | Payer: 59 | Source: Ambulatory Visit | Attending: Cardiology | Admitting: Cardiology

## 2022-01-28 ENCOUNTER — Encounter (HOSPITAL_COMMUNITY): Payer: Self-pay | Admitting: Cardiology

## 2022-01-28 VITALS — BP 100/60 | HR 75 | Wt 162.2 lb

## 2022-01-28 DIAGNOSIS — I5181 Takotsubo syndrome: Secondary | ICD-10-CM

## 2022-01-28 DIAGNOSIS — I5022 Chronic systolic (congestive) heart failure: Secondary | ICD-10-CM

## 2022-01-28 LAB — BASIC METABOLIC PANEL
Anion gap: 8 (ref 5–15)
BUN: 13 mg/dL (ref 6–20)
CO2: 27 mmol/L (ref 22–32)
Calcium: 9.2 mg/dL (ref 8.9–10.3)
Chloride: 102 mmol/L (ref 98–111)
Creatinine, Ser: 0.92 mg/dL (ref 0.44–1.00)
GFR, Estimated: >60 mL/min (ref >60)
Glucose, Bld: 87 mg/dL (ref 70–99)
Potassium: 4.4 mmol/L (ref 3.5–5.1)
Sodium: 137 mmol/L (ref 135–145)

## 2022-01-28 LAB — LIPID PANEL
Cholesterol: 226 mg/dL — ABNORMAL HIGH (ref 0–200)
HDL: 62 mg/dL (ref >40)
LDL Cholesterol: 149 mg/dL — ABNORMAL HIGH (ref 0–99)
Total CHOL/HDL Ratio: 3.6 RATIO
Triglycerides: 75 mg/dL (ref <150)
VLDL: 15 mg/dL (ref 0–40)

## 2022-01-28 NOTE — Patient Instructions (Signed)
Stop Losartan  Labs done today, your results will be available in MyChart, we will contact you for abnormal readings.  Repeat blood work in 3 months (January 2024), please call in December to arrange this appointment.  Your physician recommends that you schedule a follow-up appointment in: 1 year ( October 2024)  ** please call the office in July 2024 to arrange your follow up appointment **  If you have any questions or concerns before your next appointment please send [place] a message through [place] or call our office at [phone].    TO LEAVE A MESSAGE FOR THE NURSE SELECT OPTION 2, PLEASE LEAVE A MESSAGE INCLUDING: YOUR NAME DATE OF BIRTH CALL BACK NUMBER REASON FOR CALL**this is important as we prioritize the call backs  YOU WILL RECEIVE A CALL BACK THE SAME DAY AS LONG AS YOU CALL BEFORE 4:00 PM  At the [facility], you and your health needs are our priority. As part of our continuing mission to provide you with exceptional heart care, we have created designated Provider Care Teams. These Care Teams include your primary Cardiologist (physician) and Advanced Practice Providers (APPs- Physician Assistants and Nurse Practitioners) who all work together to provide you with the care you need, when you need it.   You may see any of the following providers on your designated Care Team at your next follow up: Dr Glori Bickers Dr Loralie Champagne Dr. Roxana Hires, NP Lyda Jester, [facility] [place], [place] Forestine Na, NP Audry Riles, PharmD   Please be sure to bring in all your medications bottles to every appointment.

## 2022-01-29 DIAGNOSIS — F902 Attention-deficit hyperactivity disorder, combined type: Secondary | ICD-10-CM | POA: Diagnosis not present

## 2022-01-29 DIAGNOSIS — F411 Generalized anxiety disorder: Secondary | ICD-10-CM | POA: Diagnosis not present

## 2022-01-29 DIAGNOSIS — R69 Illness, unspecified: Secondary | ICD-10-CM | POA: Diagnosis not present

## 2022-01-29 NOTE — Progress Notes (Addendum)
Advanced Heart Failure Clinic Note   PCP: Patient, No Pcp Per Cardiology: Dr. Aundra Dubin    HPI:  Ms Stegmaier is a 56 y.o. with h/o depression, witnessed out of hospital cardiac arrest 10/21 (received 7 minutes of CPR) at home with refractory vib/ torsades.   Prior to her 10/21 admission, she had been under a lot of stress. Witnessed cardiac arrest at home (CPR 7 min) with refractory vib/ torsades. Had cardioversion at home followed by transport to [facility]. Intubated in ER.  Cardiac cath showed normal coronaries. LV gram EF 20% concerning for Takotsubo cardiomyopathy. Required multiple cardioversions post cath for recurrent torsades. On amio drip initially but had prolonged QTc so amiodarone was stopped. Lidocaine then started. Had recurrent torsades again and received another shock. EP consulted.  St Jude ICD placed. Placed on GDMT for systolic heart failure. Mexiletine added for antiarrhymic therapy. Had cMRI prior to d/c which showed interval improvement in LVEF up to 56% suggestive of recovery of Takotsubo cardiomyopathy.  Echo in 2/22 showed EF 50-55%, normal RV.  Echo in 3/23 showed EF 55-60%, normal RV.  She returns for followup of Takotsubo cardiomyopathy.  Weight is down 36 lbs, she is on semaglutide.  She reports not exertional dyspnea or chest pain.  No lightheadedness though BP runs on the low side.  No palpitations. She still has a lot of stress in her life, son is now living with her.   ECG (personally reviewed): NSR, poor RWP  Labs (1/22): K 4, creatinine 0.89 Labs (8/22): K 4.1, creatinine 0.95 Labs (3/23): BNP 80, LDL 150, K 4.3, creatinine 0.94  PMH: 1. Anxiety.  2. Scoliosis 3. GERD 4. Takotsubo cardiomyopathy: Occurred in setting of severe emotional stress in 10/21.  - Cath in 10/21 with normal coronaries, EF 20% on LV-gram.  - Echo (10/21) with EF < 20%, global HK, normal RV.  - Cardiac MRI (11/21): EF 56%, RVEF 47%, no LGE.  - Echo (2/22): EF 41-93%, normal diastolic  function, normal RV.  - Echo (3/23): EF 55-60%, normal RV. 5. VT: Multiple episodes of torsades de pointes in setting of Takotsubo cardiomyopathy.  - St Jude ICD placed.  6. Hyperlipidemia  Review of systems complete and found to be negative unless listed in HPI.     Current Outpatient Medications  Medication Sig Dispense Refill   acetaminophen (TYLENOL) 500 MG tablet Take 1,000 mg by mouth every 6 (six) hours as needed for moderate pain.     buPROPion (WELLBUTRIN XL) 300 MG 24 hr tablet Take 300 mg by mouth in the morning.     carvedilol (COREG) 6.25 MG tablet TAKE 1 TABLET BY MOUTH 2 TIMES DAILY WITH A MEAL. 180 tablet 1   clonazePAM (KLONOPIN) 1 MG tablet Take 1 mg by mouth 3 (three) times daily as needed for anxiety.     FARXIGA 10 MG TABS tablet TAKE 1 TABLET BY MOUTH DAILY BEFORE BREAKFAST. 90 tablet 3   lamoTRIgine (LAMICTAL) 100 MG tablet Take 100 mg by mouth in the morning.     lamoTRIgine (LAMICTAL) 150 MG tablet Take 150 mg by mouth at bedtime.     omeprazole (PRILOSEC OTC) 20 MG tablet Take 20 mg by mouth daily as needed (for heartburn or indigestion).     pregabalin (LYRICA) 100 MG capsule Take 100 mg by mouth daily.     Semaglutide, 2 MG/DOSE, (OZEMPIC, 2 MG/DOSE,) 8 MG/3ML SOPN Inject 2 mg into the skin once a week. 3 mL 11   spironolactone (  ALDACTONE) 25 MG tablet TAKE 1 TABLET (25 MG TOTAL) BY MOUTH DAILY. 90 tablet 3   No current facility-administered medications for this encounter.    No Known Allergies    Social History   Socioeconomic History   Marital status: Married    Spouse name: Estafani Eyerly   Number of children: 1   Years of education: 12   Highest education level: Not on file  Occupational History   Not on file  Tobacco Use   Smoking status: Never   Smokeless tobacco: Never  Vaping Use   Vaping Use: Never used  Substance and Sexual Activity   Alcohol use: Not Currently   Drug use: Never   Sexual activity: Yes  Other Topics Concern   Not on  file  Social History Narrative   Not on file   Social Determinants of Health   Financial Resource Strain: Not on file  Food Insecurity: Not on file  Transportation Needs: Not on file  Physical Activity: Not on file  Stress: Not on file  Social Connections: Not on file  Intimate Partner Violence: Not on file      Family History  Problem Relation Age of Onset   Alzheimer's disease Father    Chronic granulomatous disease Son     Vitals:   01/28/22 1422  BP: 100/60  Pulse: 75  SpO2: 97%  Weight: 73.6 kg (162 lb 3.2 oz)   PHYSICAL EXAM: General: NAD Neck: No JVD, no thyromegaly or thyroid nodule.  Lungs: Clear to auscultation bilaterally with normal respiratory effort. CV: Nondisplaced PMI.  Heart regular S1/S2, no S3/S4, no murmur.  No peripheral edema.  No carotid bruit.  Normal pedal pulses.  Abdomen: Soft, nontender, no hepatosplenomegaly, no distention.  Skin: Intact without lesions or rashes.  Neurologic: Alert and oriented x 3.  Psych: Normal affect. Extremities: No clubbing or cyanosis.  HEENT: Normal.   ASSESSMENT & PLAN:  1. VT: Had out of hospital cardiac arrest 10/21 due to recurrent torsades with multiple shocks.  Coronaries normal in 10/21, EF <20% on echo.  Amiodarone stopped due to prolonged QT interval. Suspect that prolonged QT was the sequelae of stress (Takotsubo-type) cardiomyopathy. S/p St Jude ICD.  Cardiac MRI in 11/21 showed no delayed enhancement.  Echo in 2/22 with EF improved to 50-55%.   Echo in 3/23 with EF 55-60%, normal RV.  - Continue Coreg 6.25 mg bid  2. Takotsubo cardiomyopathy: Echo 10/21 w/  EF < 20%  (no LV thrombus), cath in 10/21 with normal coronaries.  Suspect stress (Takotsubo-type) cardiomyopathy. Recent emotional stress. Cardiac MRI 11/21 showed LV EF back up to normal range (EF 56%) with no delayed enhancement => this is supportive of stress cardiomyopathy.  Echo in 2/22 showed EF 50-55%.  Echo in 3/23 with EF 55-60%, normal RV.   NYHA Class I. Euvolemic on exam.  Weight is down now and BP is running lower though she denies orthostatic symptoms.   - Stop losartan with recovered EF and low BP.  - Continue current doses of spironolactone and Coreg - Continue Farxiga 10 mg daily.  - BMET today and q3 months with spironolactone use.  3. Psych/neuro: Significant anxiety at baseline.  4. Obesity: Weight coming down with diet, exercise, and semaglutide.  5. Hyperlipidemia: Check lipids today, she may need a statin based on last lipid panel.   Followup 1 year.    Loralie Champagne, MD 01/29/22

## 2022-01-30 ENCOUNTER — Other Ambulatory Visit: Payer: Self-pay | Admitting: Cardiology

## 2022-02-03 ENCOUNTER — Telehealth (HOSPITAL_COMMUNITY): Payer: Self-pay

## 2022-02-03 ENCOUNTER — Encounter: Payer: Self-pay | Admitting: Pharmacist

## 2022-02-03 MED ORDER — ROSUVASTATIN CALCIUM 10 MG PO TABS: 10.0000 mg | ORAL_TABLET | Freq: Every day | ORAL | 3 refills | Status: DC

## 2022-02-03 NOTE — Telephone Encounter (Addendum)
Spoke with Mr. Benham, he said that Mrs. Mahabir will call in to schedule her follow up labs closer to the time.    Rx sent to pharmacy   ----- Message from Larey Dresser, MD sent at 01/29/2022  6:49 PM EDT ----- LDL is still high.  Would recommend she start Crestor 10 mg daily with lipids/LFTs in 2 months.

## 2022-02-07 DIAGNOSIS — R69 Illness, unspecified: Secondary | ICD-10-CM | POA: Diagnosis not present

## 2022-02-07 DIAGNOSIS — F411 Generalized anxiety disorder: Secondary | ICD-10-CM | POA: Diagnosis not present

## 2022-02-07 DIAGNOSIS — F340 Cyclothymic disorder: Secondary | ICD-10-CM | POA: Diagnosis not present

## 2022-02-11 ENCOUNTER — Ambulatory Visit (INDEPENDENT_AMBULATORY_CARE_PROVIDER_SITE_OTHER): Payer: 59

## 2022-02-11 DIAGNOSIS — Z95810 Presence of automatic (implantable) cardiac defibrillator: Secondary | ICD-10-CM

## 2022-02-11 DIAGNOSIS — I469 Cardiac arrest, cause unspecified: Secondary | ICD-10-CM

## 2022-02-11 LAB — CUP PACEART REMOTE DEVICE CHECK
Battery Remaining Longevity: 102 mo
Battery Remaining Percentage: 81 %
Battery Voltage: 3.01 V
Brady Statistic RV Percent Paced: 1 %
Date Time Interrogation Session: 20231031020827
HighPow Impedance: 80 Ohm
Implantable Lead Connection Status: 753985
Implantable Lead Implant Date: 20211102
Implantable Lead Location: 753860
Implantable Pulse Generator Implant Date: 20211102
Lead Channel Impedance Value: 410 Ohm
Lead Channel Pacing Threshold Amplitude: 0.75 V
Lead Channel Pacing Threshold Pulse Width: 0.5 ms
Lead Channel Sensing Intrinsic Amplitude: 10.9 mV
Lead Channel Setting Pacing Amplitude: 2.5 V
Lead Channel Setting Pacing Pulse Width: 0.5 ms
Lead Channel Setting Sensing Sensitivity: 0.5 mV
Pulse Gen Serial Number: 111028702

## 2022-02-12 DIAGNOSIS — F411 Generalized anxiety disorder: Secondary | ICD-10-CM | POA: Diagnosis not present

## 2022-02-12 DIAGNOSIS — R69 Illness, unspecified: Secondary | ICD-10-CM | POA: Diagnosis not present

## 2022-02-12 DIAGNOSIS — F340 Cyclothymic disorder: Secondary | ICD-10-CM | POA: Diagnosis not present

## 2022-02-13 ENCOUNTER — Other Ambulatory Visit (HOSPITAL_COMMUNITY): Payer: Self-pay | Admitting: Cardiology

## 2022-02-26 NOTE — Progress Notes (Signed)
Remote ICD transmission.   

## 2022-02-27 DIAGNOSIS — F411 Generalized anxiety disorder: Secondary | ICD-10-CM | POA: Diagnosis not present

## 2022-02-27 DIAGNOSIS — F902 Attention-deficit hyperactivity disorder, combined type: Secondary | ICD-10-CM | POA: Diagnosis not present

## 2022-02-27 DIAGNOSIS — R69 Illness, unspecified: Secondary | ICD-10-CM | POA: Diagnosis not present

## 2022-03-10 DIAGNOSIS — R69 Illness, unspecified: Secondary | ICD-10-CM | POA: Diagnosis not present

## 2022-03-10 DIAGNOSIS — F340 Cyclothymic disorder: Secondary | ICD-10-CM | POA: Diagnosis not present

## 2022-03-10 DIAGNOSIS — F411 Generalized anxiety disorder: Secondary | ICD-10-CM | POA: Diagnosis not present

## 2022-03-17 DIAGNOSIS — F902 Attention-deficit hyperactivity disorder, combined type: Secondary | ICD-10-CM | POA: Diagnosis not present

## 2022-03-17 DIAGNOSIS — R69 Illness, unspecified: Secondary | ICD-10-CM | POA: Diagnosis not present

## 2022-03-17 DIAGNOSIS — F411 Generalized anxiety disorder: Secondary | ICD-10-CM | POA: Diagnosis not present

## 2022-03-26 DIAGNOSIS — F411 Generalized anxiety disorder: Secondary | ICD-10-CM | POA: Diagnosis not present

## 2022-03-26 DIAGNOSIS — R69 Illness, unspecified: Secondary | ICD-10-CM | POA: Diagnosis not present

## 2022-03-26 DIAGNOSIS — F902 Attention-deficit hyperactivity disorder, combined type: Secondary | ICD-10-CM | POA: Diagnosis not present

## 2022-04-11 DIAGNOSIS — R69 Illness, unspecified: Secondary | ICD-10-CM | POA: Diagnosis not present

## 2022-04-11 DIAGNOSIS — F902 Attention-deficit hyperactivity disorder, combined type: Secondary | ICD-10-CM | POA: Diagnosis not present

## 2022-04-11 DIAGNOSIS — F411 Generalized anxiety disorder: Secondary | ICD-10-CM | POA: Diagnosis not present

## 2022-05-05 DIAGNOSIS — F411 Generalized anxiety disorder: Secondary | ICD-10-CM | POA: Diagnosis not present

## 2022-05-05 DIAGNOSIS — F902 Attention-deficit hyperactivity disorder, combined type: Secondary | ICD-10-CM | POA: Diagnosis not present

## 2022-05-05 DIAGNOSIS — R69 Illness, unspecified: Secondary | ICD-10-CM | POA: Diagnosis not present

## 2022-05-13 ENCOUNTER — Ambulatory Visit: Payer: 59

## 2022-05-13 DIAGNOSIS — F411 Generalized anxiety disorder: Secondary | ICD-10-CM | POA: Diagnosis not present

## 2022-05-13 DIAGNOSIS — I469 Cardiac arrest, cause unspecified: Secondary | ICD-10-CM

## 2022-05-13 DIAGNOSIS — R69 Illness, unspecified: Secondary | ICD-10-CM | POA: Diagnosis not present

## 2022-05-13 DIAGNOSIS — F902 Attention-deficit hyperactivity disorder, combined type: Secondary | ICD-10-CM | POA: Diagnosis not present

## 2022-05-13 LAB — CUP PACEART REMOTE DEVICE CHECK
Battery Remaining Longevity: 100 mo
Battery Remaining Percentage: 78 %
Battery Voltage: 3.01 V
Brady Statistic RV Percent Paced: 1 %
Date Time Interrogation Session: 20240130131836
HighPow Impedance: 89 Ohm
Implantable Lead Connection Status: 753985
Implantable Lead Implant Date: 20211102
Implantable Lead Location: 753860
Implantable Pulse Generator Implant Date: 20211102
Lead Channel Impedance Value: 430 Ohm
Lead Channel Pacing Threshold Amplitude: 0.75 V
Lead Channel Pacing Threshold Pulse Width: 0.5 ms
Lead Channel Sensing Intrinsic Amplitude: 11.4 mV
Lead Channel Setting Pacing Amplitude: 2.5 V
Lead Channel Setting Pacing Pulse Width: 0.5 ms
Lead Channel Setting Sensing Sensitivity: 0.5 mV
Pulse Gen Serial Number: 111028702

## 2022-05-16 ENCOUNTER — Other Ambulatory Visit (HOSPITAL_COMMUNITY): Payer: Self-pay | Admitting: Cardiology

## 2022-05-23 DIAGNOSIS — R69 Illness, unspecified: Secondary | ICD-10-CM | POA: Diagnosis not present

## 2022-05-23 DIAGNOSIS — F411 Generalized anxiety disorder: Secondary | ICD-10-CM | POA: Diagnosis not present

## 2022-05-23 DIAGNOSIS — F340 Cyclothymic disorder: Secondary | ICD-10-CM | POA: Diagnosis not present

## 2022-05-27 DIAGNOSIS — F902 Attention-deficit hyperactivity disorder, combined type: Secondary | ICD-10-CM | POA: Diagnosis not present

## 2022-05-27 DIAGNOSIS — F411 Generalized anxiety disorder: Secondary | ICD-10-CM | POA: Diagnosis not present

## 2022-05-27 DIAGNOSIS — R69 Illness, unspecified: Secondary | ICD-10-CM | POA: Diagnosis not present

## 2022-06-10 DIAGNOSIS — F340 Cyclothymic disorder: Secondary | ICD-10-CM | POA: Diagnosis not present

## 2022-06-10 DIAGNOSIS — F902 Attention-deficit hyperactivity disorder, combined type: Secondary | ICD-10-CM | POA: Diagnosis not present

## 2022-06-10 DIAGNOSIS — F411 Generalized anxiety disorder: Secondary | ICD-10-CM | POA: Diagnosis not present

## 2022-06-10 NOTE — Progress Notes (Signed)
Remote ICD transmission.   

## 2022-06-26 ENCOUNTER — Other Ambulatory Visit (HOSPITAL_COMMUNITY): Payer: Self-pay

## 2022-06-26 MED ORDER — LISDEXAMFETAMINE DIMESYLATE 40 MG PO CAPS
40.0000 mg | ORAL_CAPSULE | Freq: Every day | ORAL | 0 refills | Status: DC
  Filled 2022-06-26: qty 30, 30d supply, fill #0

## 2022-07-02 DIAGNOSIS — F411 Generalized anxiety disorder: Secondary | ICD-10-CM | POA: Diagnosis not present

## 2022-07-02 DIAGNOSIS — F902 Attention-deficit hyperactivity disorder, combined type: Secondary | ICD-10-CM | POA: Diagnosis not present

## 2022-07-02 DIAGNOSIS — F340 Cyclothymic disorder: Secondary | ICD-10-CM | POA: Diagnosis not present

## 2022-07-04 ENCOUNTER — Other Ambulatory Visit (HOSPITAL_COMMUNITY): Payer: Self-pay

## 2022-08-05 DIAGNOSIS — F340 Cyclothymic disorder: Secondary | ICD-10-CM | POA: Diagnosis not present

## 2022-08-05 DIAGNOSIS — F411 Generalized anxiety disorder: Secondary | ICD-10-CM | POA: Diagnosis not present

## 2022-08-05 DIAGNOSIS — F902 Attention-deficit hyperactivity disorder, combined type: Secondary | ICD-10-CM | POA: Diagnosis not present

## 2022-08-12 ENCOUNTER — Ambulatory Visit (INDEPENDENT_AMBULATORY_CARE_PROVIDER_SITE_OTHER): Payer: 59

## 2022-08-12 DIAGNOSIS — I469 Cardiac arrest, cause unspecified: Secondary | ICD-10-CM | POA: Diagnosis not present

## 2022-08-13 LAB — CUP PACEART REMOTE DEVICE CHECK
Battery Remaining Longevity: 97 mo
Battery Remaining Percentage: 77 %
Battery Voltage: 3.01 V
Brady Statistic RV Percent Paced: 1 %
Date Time Interrogation Session: 20240430030204
HighPow Impedance: 83 Ohm
Implantable Lead Connection Status: 753985
Implantable Lead Implant Date: 20211102
Implantable Lead Location: 753860
Implantable Pulse Generator Implant Date: 20211102
Lead Channel Impedance Value: 440 Ohm
Lead Channel Pacing Threshold Amplitude: 0.75 V
Lead Channel Pacing Threshold Pulse Width: 0.5 ms
Lead Channel Sensing Intrinsic Amplitude: 12 mV
Lead Channel Setting Pacing Amplitude: 2.5 V
Lead Channel Setting Pacing Pulse Width: 0.5 ms
Lead Channel Setting Sensing Sensitivity: 0.5 mV
Pulse Gen Serial Number: 111028702

## 2022-08-19 DIAGNOSIS — F902 Attention-deficit hyperactivity disorder, combined type: Secondary | ICD-10-CM | POA: Diagnosis not present

## 2022-08-19 DIAGNOSIS — F411 Generalized anxiety disorder: Secondary | ICD-10-CM | POA: Diagnosis not present

## 2022-08-19 DIAGNOSIS — F340 Cyclothymic disorder: Secondary | ICD-10-CM | POA: Diagnosis not present

## 2022-08-25 DIAGNOSIS — F902 Attention-deficit hyperactivity disorder, combined type: Secondary | ICD-10-CM | POA: Diagnosis not present

## 2022-08-25 DIAGNOSIS — F340 Cyclothymic disorder: Secondary | ICD-10-CM | POA: Diagnosis not present

## 2022-08-25 DIAGNOSIS — F411 Generalized anxiety disorder: Secondary | ICD-10-CM | POA: Diagnosis not present

## 2022-08-26 DIAGNOSIS — F411 Generalized anxiety disorder: Secondary | ICD-10-CM | POA: Diagnosis not present

## 2022-08-26 DIAGNOSIS — F902 Attention-deficit hyperactivity disorder, combined type: Secondary | ICD-10-CM | POA: Diagnosis not present

## 2022-08-26 DIAGNOSIS — F340 Cyclothymic disorder: Secondary | ICD-10-CM | POA: Diagnosis not present

## 2022-09-03 NOTE — Progress Notes (Signed)
Remote ICD transmission.   

## 2022-09-04 DIAGNOSIS — F340 Cyclothymic disorder: Secondary | ICD-10-CM | POA: Diagnosis not present

## 2022-09-04 DIAGNOSIS — F902 Attention-deficit hyperactivity disorder, combined type: Secondary | ICD-10-CM | POA: Diagnosis not present

## 2022-09-04 DIAGNOSIS — F411 Generalized anxiety disorder: Secondary | ICD-10-CM | POA: Diagnosis not present

## 2022-09-17 DIAGNOSIS — F340 Cyclothymic disorder: Secondary | ICD-10-CM | POA: Diagnosis not present

## 2022-09-17 DIAGNOSIS — F902 Attention-deficit hyperactivity disorder, combined type: Secondary | ICD-10-CM | POA: Diagnosis not present

## 2022-09-17 DIAGNOSIS — F411 Generalized anxiety disorder: Secondary | ICD-10-CM | POA: Diagnosis not present

## 2022-09-26 DIAGNOSIS — F902 Attention-deficit hyperactivity disorder, combined type: Secondary | ICD-10-CM | POA: Diagnosis not present

## 2022-09-26 DIAGNOSIS — F340 Cyclothymic disorder: Secondary | ICD-10-CM | POA: Diagnosis not present

## 2022-09-26 DIAGNOSIS — F411 Generalized anxiety disorder: Secondary | ICD-10-CM | POA: Diagnosis not present

## 2022-10-03 DIAGNOSIS — F902 Attention-deficit hyperactivity disorder, combined type: Secondary | ICD-10-CM | POA: Diagnosis not present

## 2022-10-03 DIAGNOSIS — F411 Generalized anxiety disorder: Secondary | ICD-10-CM | POA: Diagnosis not present

## 2022-10-03 DIAGNOSIS — F340 Cyclothymic disorder: Secondary | ICD-10-CM | POA: Diagnosis not present

## 2022-10-28 ENCOUNTER — Other Ambulatory Visit (HOSPITAL_COMMUNITY): Payer: Self-pay | Admitting: Cardiology

## 2022-11-11 ENCOUNTER — Ambulatory Visit (INDEPENDENT_AMBULATORY_CARE_PROVIDER_SITE_OTHER): Payer: 59

## 2022-11-11 DIAGNOSIS — I5022 Chronic systolic (congestive) heart failure: Secondary | ICD-10-CM

## 2022-11-11 DIAGNOSIS — I469 Cardiac arrest, cause unspecified: Secondary | ICD-10-CM

## 2022-11-11 LAB — CUP PACEART REMOTE DEVICE CHECK
Battery Remaining Longevity: 94 mo
Battery Remaining Percentage: 75 %
Battery Voltage: 3.01 V
Brady Statistic RV Percent Paced: 1 %
Date Time Interrogation Session: 20240730020546
HighPow Impedance: 70 Ohm
Implantable Lead Connection Status: 753985
Implantable Lead Implant Date: 20211102
Implantable Lead Location: 753860
Implantable Pulse Generator Implant Date: 20211102
Lead Channel Impedance Value: 390 Ohm
Lead Channel Pacing Threshold Amplitude: 0.75 V
Lead Channel Pacing Threshold Pulse Width: 0.5 ms
Lead Channel Sensing Intrinsic Amplitude: 11.4 mV
Lead Channel Setting Pacing Amplitude: 2.5 V
Lead Channel Setting Pacing Pulse Width: 0.5 ms
Lead Channel Setting Sensing Sensitivity: 0.5 mV
Pulse Gen Serial Number: 111028702

## 2022-12-03 ENCOUNTER — Telehealth: Payer: Self-pay

## 2022-12-03 NOTE — Telephone Encounter (Signed)
The patient has questions about her HF diagnostics. I told her the nurse will give her a call back.

## 2022-12-03 NOTE — Progress Notes (Signed)
Remote ICD transmission.   

## 2022-12-03 NOTE — Telephone Encounter (Signed)
Patient states that she is having some symptoms and concerns with extremity/ankle swelling, fatigue and weakness. Denies any SOB with exertion.  I explained Corvue monitoring, also discussed Randon Goldsmith, RN monthly HF monitoring options.  Patient does see Dr. Shirlee Latch.  There is sign of retention beginning on her Corvue report.  Patient instructed to call Dr. Alford Highland office and get appointment.  She will let me know after seeing them if she wants to pursue getting set up with Jacki Cones.

## 2022-12-04 NOTE — Telephone Encounter (Signed)
Give Lasix 20 mg daily x 3 days and arrange appointment in clinic with APP.

## 2022-12-04 NOTE — Telephone Encounter (Signed)
LMOM  [phone] (M)

## 2022-12-05 ENCOUNTER — Ambulatory Visit (HOSPITAL_COMMUNITY): Admission: RE | Admit: 2022-12-05 | Payer: 59 | Source: Ambulatory Visit

## 2022-12-05 ENCOUNTER — Encounter (HOSPITAL_COMMUNITY): Payer: Self-pay

## 2022-12-05 VITALS — BP 114/74 | HR 89 | Wt 163.6 lb

## 2022-12-05 DIAGNOSIS — I4721 Torsades de pointes: Secondary | ICD-10-CM | POA: Insufficient documentation

## 2022-12-05 DIAGNOSIS — F419 Anxiety disorder, unspecified: Secondary | ICD-10-CM | POA: Diagnosis not present

## 2022-12-05 DIAGNOSIS — I5181 Takotsubo syndrome: Secondary | ICD-10-CM

## 2022-12-05 DIAGNOSIS — I469 Cardiac arrest, cause unspecified: Secondary | ICD-10-CM | POA: Insufficient documentation

## 2022-12-05 DIAGNOSIS — E669 Obesity, unspecified: Secondary | ICD-10-CM | POA: Insufficient documentation

## 2022-12-05 DIAGNOSIS — I959 Hypotension, unspecified: Secondary | ICD-10-CM | POA: Diagnosis not present

## 2022-12-05 DIAGNOSIS — E785 Hyperlipidemia, unspecified: Secondary | ICD-10-CM | POA: Insufficient documentation

## 2022-12-05 DIAGNOSIS — F32A Depression, unspecified: Secondary | ICD-10-CM | POA: Insufficient documentation

## 2022-12-05 DIAGNOSIS — I5022 Chronic systolic (congestive) heart failure: Secondary | ICD-10-CM | POA: Diagnosis not present

## 2022-12-05 LAB — BASIC METABOLIC PANEL
Anion gap: 7 (ref 5–15)
BUN: 12 mg/dL (ref 6–20)
CO2: 27 mmol/L (ref 22–32)
Calcium: 8.9 mg/dL (ref 8.9–10.3)
Chloride: 101 mmol/L (ref 98–111)
Creatinine, Ser: 0.81 mg/dL (ref 0.44–1.00)
GFR, Estimated: >60 mL/min (ref >60)
Glucose, Bld: 93 mg/dL (ref 70–99)
Potassium: 4.6 mmol/L (ref 3.5–5.1)
Sodium: 135 mmol/L (ref 135–145)

## 2022-12-05 LAB — BRAIN NATRIURETIC PEPTIDE: B Natriuretic Peptide: 38 pg/mL (ref 0.0–100.0)

## 2022-12-05 MED ORDER — FUROSEMIDE 20 MG PO TABS: 20.0000 mg | ORAL_TABLET | ORAL | 0 refills | Status: DC

## 2022-12-05 NOTE — Progress Notes (Signed)
Advanced Heart Failure Clinic Note   PCP: Patient, No Pcp Per Cardiology: Dr. Shirlee Latch    HPI:  Kristina Nichols is a 57 y.o. with h/o depression, witnessed out of hospital cardiac arrest 10/21 (received 7 minutes of CPR) at home with refractory vib/ torsades.   Prior to her 10/21 admission, she had been under a lot of stress. Witnessed cardiac arrest at home (CPR 7 min) with refractory vib/ torsades. Had cardioversion at home followed by transport to [facility]. Intubated in ER.  Cardiac cath showed normal coronaries. LV gram EF 20% concerning for Takotsubo cardiomyopathy. Required multiple cardioversions post cath for recurrent torsades. On amio drip initially but had prolonged QTc so amiodarone was stopped. Lidocaine then started. Had recurrent torsades again and received another shock. EP consulted.  St Jude ICD placed. Placed on GDMT for systolic heart failure. Mexiletine added for antiarrhymic therapy. Had cMRI prior to d/c which showed interval improvement in LVEF up to 56% suggestive of recovery of Takotsubo cardiomyopathy.  Echo in 2/22 showed EF 50-55%, normal RV.  Echo in 3/23 showed EF 55-60%, normal RV.   12/04/22 Corvue reviewed by Dr Shirlee Latch. Concerned about fluid accumulation. Lasix 20 mg daily was started.   She is being seen today for an acute work in due to recent fluid accumulation. Overall feeling  stressed. Having issues with abdominal bloating. Occasionally short of breath.  She is not exercising. Denies PND/Orthopnea. Appetite ok. No fever or chills. Weight at home has gone up and down. Taking all medications.   Corvue: No VT. Impedance down.   Labs (1/22): K 4, creatinine 0.89 Labs (8/22): K 4.1, creatinine 0.95 Labs (3/23): BNP 80, LDL 150, K 4.3, creatinine 0.94 Labs (10/23): K 4.4 Creatinine 0.9   PMH: 1. Anxiety.  2. Scoliosis 3. GERD 4. Takotsubo cardiomyopathy: Occurred in setting of severe emotional stress in 10/21.  - Cath in 10/21 with normal coronaries, EF 20%  on LV-gram.  - Echo (10/21) with EF < 20%, global HK, normal RV.  - Cardiac MRI (11/21): EF 56%, RVEF 47%, no LGE.  - Echo (2/22): EF 50-55%, normal diastolic function, normal RV.  - Echo (3/23): EF 55-60%, normal RV. 5. VT: Multiple episodes of torsades de pointes in setting of Takotsubo cardiomyopathy.  - St Jude ICD placed.  6. Hyperlipidemia  Review of systems complete and found to be negative unless listed in HPI.     Current Outpatient Medications  Medication Sig Dispense Refill   acetaminophen (TYLENOL) 500 MG tablet Take 1,000 mg by mouth every 6 (six) hours as needed for moderate pain.     buPROPion (WELLBUTRIN XL) 300 MG 24 hr tablet Take 300 mg by mouth in the morning.     carvedilol (COREG) 6.25 MG tablet TAKE 1 TABLET BY MOUTH TWICE A DAY WITH FOOD 180 tablet 1   clonazePAM (KLONOPIN) 1 MG tablet Take 1 mg by mouth 3 (three) times daily as needed for anxiety.     FARXIGA 10 MG TABS tablet TAKE 1 TABLET BY MOUTH EVERY DAY BEFORE BREAKFAST 90 tablet 3   lamoTRIgine (LAMICTAL) 100 MG tablet Take 100 mg by mouth in the morning.     lamoTRIgine (LAMICTAL) 150 MG tablet Take 150 mg by mouth at bedtime.     omeprazole (PRILOSEC OTC) 20 MG tablet Take 20 mg by mouth daily as needed (for heartburn or indigestion).     pregabalin (LYRICA) 100 MG capsule Take 100 mg by mouth daily.     spironolactone (  ALDACTONE) 25 MG tablet TAKE 1 TABLET (25 MG TOTAL) BY MOUTH DAILY. 90 tablet 3   No current facility-administered medications for this encounter.    No Known Allergies    Social History   Socioeconomic History   Marital status: Married    Spouse name: Sheri Keplar   Number of children: 1   Years of education: 12   Highest education level: Not on file  Occupational History   Not on file  Tobacco Use   Smoking status: Never   Smokeless tobacco: Never  Vaping Use   Vaping status: Never Used  Substance and Sexual Activity   Alcohol use: Not Currently   Drug use: Never    Sexual activity: Yes  Other Topics Concern   Not on file  Social History Narrative   Not on file   Social Determinants of Health   Financial Resource Strain: Not on file  Food Insecurity: Not on file  Transportation Needs: Not on file  Physical Activity: Not on file  Stress: Not on file  Social Connections: Not on file  Intimate Partner Violence: Not on file      Family History  Problem Relation Age of Onset   Alzheimer's disease Father    Chronic granulomatous disease Son     Vitals:   12/05/22 1410  BP: 114/74  Pulse: 89  SpO2: 95%  Weight: 74.2 kg (163 lb 9.6 oz)   Wt Readings from Last 3 Encounters:  12/05/22 74.2 kg (163 lb 9.6 oz)  01/28/22 73.6 kg (162 lb 3.2 oz)  10/07/21 80.2 kg (176 lb 12.9 oz)    PHYSICAL EXAM: General: Walked in the clinic.  No resp difficulty HEENT: normal Neck: supple. JVP 8-9 . Carotids 2+ bilat; no bruits. No lymphadenopathy or thryomegaly appreciated. Cor: PMI nondisplaced. Regular rate & rhythm. No rubs, gallops or murmurs. Lungs: clear Abdomen: soft, nontender, nondistended. No hepatosplenomegaly. No bruits or masses. Good bowel sounds. Extremities: no cyanosis, clubbing, rash, edema Neuro: alert & orientedx3, cranial nerves grossly intact. moves all 4 extremities w/o difficulty. Affect pleasant EKG: SR 71 bpm   ASSESSMENT & PLAN:  1. VT: Had out of hospital cardiac arrest 10/21 due to recurrent torsades with multiple shocks.  Coronaries normal in 10/21, EF <20% on echo.  Amiodarone stopped due to prolonged QT interval. Suspect that prolonged QT was the sequelae of stress (Takotsubo-type) cardiomyopathy. S/p St Jude ICD.  Cardiac MRI in 11/21 showed no delayed enhancement.  Echo in 2/22 with EF improved to 50-55%.   Echo in 3/23 with EF 55-60%, normal RV.  - Continue Coreg 6.25 mg bid  EKG - SR. CorVue - No VT. We discussed.  2. Takotsubo cardiomyopathy: Echo 10/21 w/  EF < 20%  (no LV thrombus), cath in 10/21 with normal  coronaries.  Suspect stress (Takotsubo-type) cardiomyopathy. Recent emotional stress. Cardiac MRI 11/21 showed LV EF back up to normal range (EF 56%) with no delayed enhancement => this is supportive of stress cardiomyopathy.  Echo in 2/22 showed EF 50-55%.  Echo in 3/23 with EF 55-60%, normal RV.  - NYHA II-III. Volume status recently trending up on device interrogation. Discussed Dr Kathlyn [place] recommendations to take 20 mg lasix daily x 3 day then as needed.  .-Off losartan with recovered EF and low BP.  - Continue current doses of spironolactone and Coreg - Continue Farxiga 10 mg daily.  -Check BMET  Repeat ECHO  next visit.  3. Psych/neuro: Significant anxiety at baseline.  4. Obesity: Not  surrenlty exercsing. Not using semaglutide. Discussed portion control and  5. Hyperlipidemia: Per PCP   Repeat Echo.  Check BMET/BNP.   Follow up in October with Dr Shirlee Latch.    Tonye Becket, NP 12/05/22

## 2022-12-05 NOTE — Patient Instructions (Addendum)
Thank you for coming in today  If you had labs drawn today, any labs that are abnormal the clinic will call you No news is good news  Medications: Lasix 20 mg 1 tablet daily for 3 days 12/06/2022 12/07/2022 12/08/2022 then as needed For weight gain of 3 lbs in 24 hours or 5 lbs in a week   Follow up appointments:  Your physician recommends that you schedule a follow-up appointment in:  Your physician has requested that you have an echocardiogram. Echocardiography is a painless test that uses sound waves to create images of your heart. It provides your doctor with information about the size and shape of your heart and how well your heart's chambers and valves are working. This procedure takes approximately one hour. There are no restrictions for this procedure.   Keep 02/04/2023 appointment with Dr. Shirlee Latch   Do the following things EVERYDAY: Weigh yourself in the morning before breakfast. Write it down and keep it in a log. Take your medicines as prescribed Eat low salt foods--Limit salt (sodium) to 2000 mg per day.  Stay as active as you can everyday Limit all fluids for the day to less than 2 liters   At the [facility], you and your health needs are our priority. As part of our continuing mission to provide you with exceptional heart care, we have created designated Provider Care Teams. These Care Teams include your primary Cardiologist (physician) and Advanced Practice Providers (APPs- Physician Assistants and Nurse Practitioners) who all work together to provide you with the care you need, when you need it.   You may see any of the following providers on your designated Care Team at your next follow up: Dr Arvilla Meres Dr Marca Ancona Dr. Marcos Eke, NP Robbie Lis, [place] [facility] [place], [place] Brynda Peon, NP Karle Plumber, PharmD   Please be sure to bring in all your medications bottles to every appointment.    Thank  you for choosing [facility]  If you have any questions or concerns before your next appointment please send [place] a message through [place] or call our office at [phone].    TO LEAVE A MESSAGE FOR THE NURSE SELECT OPTION 2, PLEASE LEAVE A MESSAGE INCLUDING: YOUR NAME DATE OF BIRTH CALL BACK NUMBER REASON FOR CALL**this is important as we prioritize the call backs  YOU WILL RECEIVE A CALL BACK THE SAME DAY AS LONG AS YOU CALL BEFORE 4:00 PM

## 2022-12-09 NOTE — Telephone Encounter (Signed)
Pt aware.

## 2022-12-10 DIAGNOSIS — M419 Scoliosis, unspecified: Secondary | ICD-10-CM | POA: Diagnosis not present

## 2022-12-10 DIAGNOSIS — M5416 Radiculopathy, lumbar region: Secondary | ICD-10-CM | POA: Diagnosis not present

## 2022-12-10 DIAGNOSIS — M4316 Spondylolisthesis, lumbar region: Secondary | ICD-10-CM | POA: Diagnosis not present

## 2022-12-12 ENCOUNTER — Other Ambulatory Visit (HOSPITAL_COMMUNITY): Payer: Self-pay | Admitting: Adult Health

## 2022-12-17 ENCOUNTER — Ambulatory Visit (HOSPITAL_COMMUNITY)
Admission: RE | Admit: 2022-12-17 | Discharge: 2022-12-17 | Disposition: A | Discharge status: Home / Self Care | Payer: 59 | Source: Ambulatory Visit | Attending: Cardiology | Admitting: Cardiology

## 2022-12-17 DIAGNOSIS — I5022 Chronic systolic (congestive) heart failure: Secondary | ICD-10-CM | POA: Diagnosis not present

## 2022-12-17 DIAGNOSIS — E785 Hyperlipidemia, unspecified: Secondary | ICD-10-CM | POA: Diagnosis not present

## 2022-12-18 LAB — ECHOCARDIOGRAM COMPLETE
Area-P 1/2: 3.48 cm2
Calc EF: 59.9 %
P 1/2 time: 609 ms
S' Lateral: 2.3 cm
Single Plane A2C EF: 59.4 %
Single Plane A4C EF: 61.3 %

## 2022-12-22 ENCOUNTER — Telehealth (HOSPITAL_COMMUNITY): Payer: Self-pay | Admitting: *Deleted

## 2022-12-22 NOTE — Telephone Encounter (Signed)
Called patient's husband per Dr. Shirlee Latch with following echo results:  "EF is normal. Good echo."  Husband will pass on to patient and he is very relieved.

## 2022-12-30 ENCOUNTER — Encounter: Payer: Self-pay | Admitting: Internal Medicine

## 2023-01-01 ENCOUNTER — Other Ambulatory Visit (HOSPITAL_COMMUNITY): Payer: Self-pay | Admitting: Cardiology

## 2023-01-05 DIAGNOSIS — F411 Generalized anxiety disorder: Secondary | ICD-10-CM | POA: Diagnosis not present

## 2023-01-05 DIAGNOSIS — F902 Attention-deficit hyperactivity disorder, combined type: Secondary | ICD-10-CM | POA: Diagnosis not present

## 2023-01-05 DIAGNOSIS — F340 Cyclothymic disorder: Secondary | ICD-10-CM | POA: Diagnosis not present

## 2023-01-17 ENCOUNTER — Other Ambulatory Visit (HOSPITAL_COMMUNITY): Payer: Self-pay | Admitting: Adult Health

## 2023-01-26 ENCOUNTER — Other Ambulatory Visit (HOSPITAL_COMMUNITY): Payer: Self-pay | Admitting: Cardiology

## 2023-02-04 ENCOUNTER — Encounter (HOSPITAL_COMMUNITY): Payer: 59 | Admitting: Cardiology

## 2023-02-10 ENCOUNTER — Ambulatory Visit (INDEPENDENT_AMBULATORY_CARE_PROVIDER_SITE_OTHER): Payer: 59

## 2023-02-10 DIAGNOSIS — I5181 Takotsubo syndrome: Secondary | ICD-10-CM | POA: Diagnosis not present

## 2023-02-10 DIAGNOSIS — I5022 Chronic systolic (congestive) heart failure: Secondary | ICD-10-CM

## 2023-02-12 LAB — CUP PACEART REMOTE DEVICE CHECK
Battery Remaining Longevity: 91 mo
Battery Remaining Percentage: 72 %
Battery Voltage: 2.99 V
Brady Statistic RV Percent Paced: 1 %
Date Time Interrogation Session: 20241029021130
HighPow Impedance: 80 Ohm
Implantable Lead Connection Status: 753985
Implantable Lead Implant Date: 20211102
Implantable Lead Location: 753860
Implantable Pulse Generator Implant Date: 20211102
Lead Channel Impedance Value: 430 Ohm
Lead Channel Pacing Threshold Amplitude: 0.75 V
Lead Channel Pacing Threshold Pulse Width: 0.5 ms
Lead Channel Sensing Intrinsic Amplitude: 11.1 mV
Lead Channel Setting Pacing Amplitude: 2.5 V
Lead Channel Setting Pacing Pulse Width: 0.5 ms
Lead Channel Setting Sensing Sensitivity: 0.5 mV
Pulse Gen Serial Number: 111028702

## 2023-02-12 MED ORDER — SEMAGLUTIDE (1 MG/DOSE) 4 MG/3ML ~~LOC~~ SOPN: 1.0000 mg | PEN_INJECTOR | SUBCUTANEOUS | 1 refills | Status: DC

## 2023-02-20 DIAGNOSIS — H524 Presbyopia: Secondary | ICD-10-CM | POA: Diagnosis not present

## 2023-03-03 NOTE — Progress Notes (Signed)
Remote ICD transmission.   

## 2023-03-22 ENCOUNTER — Other Ambulatory Visit (HOSPITAL_COMMUNITY): Payer: Self-pay | Admitting: Cardiology

## 2023-04-06 DIAGNOSIS — F411 Generalized anxiety disorder: Secondary | ICD-10-CM | POA: Diagnosis not present

## 2023-04-06 DIAGNOSIS — F340 Cyclothymic disorder: Secondary | ICD-10-CM | POA: Diagnosis not present

## 2023-04-06 DIAGNOSIS — F902 Attention-deficit hyperactivity disorder, combined type: Secondary | ICD-10-CM | POA: Diagnosis not present

## 2023-05-12 ENCOUNTER — Encounter: Payer: Self-pay | Admitting: Internal Medicine

## 2023-05-12 ENCOUNTER — Ambulatory Visit (INDEPENDENT_AMBULATORY_CARE_PROVIDER_SITE_OTHER): Payer: 59

## 2023-05-12 DIAGNOSIS — I5181 Takotsubo syndrome: Secondary | ICD-10-CM

## 2023-05-12 DIAGNOSIS — I5022 Chronic systolic (congestive) heart failure: Secondary | ICD-10-CM

## 2023-05-12 LAB — CUP PACEART REMOTE DEVICE CHECK
Battery Remaining Longevity: 89 mo
Battery Remaining Percentage: 71 %
Battery Voltage: 2.99 V
Brady Statistic RV Percent Paced: 1 %
Date Time Interrogation Session: 20250128011126
HighPow Impedance: 78 Ohm
Implantable Lead Connection Status: 753985
Implantable Lead Implant Date: 20211102
Implantable Lead Location: 753860
Implantable Pulse Generator Implant Date: 20211102
Lead Channel Impedance Value: 400 Ohm
Lead Channel Pacing Threshold Amplitude: 0.75 V
Lead Channel Pacing Threshold Pulse Width: 0.5 ms
Lead Channel Sensing Intrinsic Amplitude: 10.3 mV
Lead Channel Setting Pacing Amplitude: 2.5 V
Lead Channel Setting Pacing Pulse Width: 0.5 ms
Lead Channel Setting Sensing Sensitivity: 0.5 mV
Pulse Gen Serial Number: 111028702

## 2023-06-07 ENCOUNTER — Other Ambulatory Visit: Payer: Self-pay | Admitting: Cardiology

## 2023-06-18 NOTE — Telephone Encounter (Signed)
 Called pt and LVM to call back.

## 2023-06-22 NOTE — Progress Notes (Signed)
 Remote ICD transmission.

## 2023-06-23 NOTE — Telephone Encounter (Signed)
 Contacted pt again and LVM to call us back

## 2023-07-05 ENCOUNTER — Other Ambulatory Visit (HOSPITAL_COMMUNITY): Payer: Self-pay | Admitting: Cardiology

## 2023-07-06 DIAGNOSIS — F902 Attention-deficit hyperactivity disorder, combined type: Secondary | ICD-10-CM | POA: Diagnosis not present

## 2023-07-06 DIAGNOSIS — F411 Generalized anxiety disorder: Secondary | ICD-10-CM | POA: Diagnosis not present

## 2023-07-06 DIAGNOSIS — F340 Cyclothymic disorder: Secondary | ICD-10-CM | POA: Diagnosis not present

## 2023-08-11 ENCOUNTER — Ambulatory Visit (INDEPENDENT_AMBULATORY_CARE_PROVIDER_SITE_OTHER): Payer: 59

## 2023-08-11 ENCOUNTER — Encounter: Payer: Self-pay | Admitting: Internal Medicine

## 2023-08-11 DIAGNOSIS — I5181 Takotsubo syndrome: Secondary | ICD-10-CM | POA: Diagnosis not present

## 2023-08-11 LAB — CUP PACEART REMOTE DEVICE CHECK
Battery Remaining Longevity: 86 mo
Battery Remaining Percentage: 69 %
Battery Voltage: 2.99 V
Brady Statistic RV Percent Paced: 1 %
Date Time Interrogation Session: 20250429030554
HighPow Impedance: 88 Ohm
Implantable Lead Connection Status: 753985
Implantable Lead Implant Date: 20211102
Implantable Lead Location: 753860
Implantable Pulse Generator Implant Date: 20211102
Lead Channel Impedance Value: 400 Ohm
Lead Channel Pacing Threshold Amplitude: 0.75 V
Lead Channel Pacing Threshold Pulse Width: 0.5 ms
Lead Channel Sensing Intrinsic Amplitude: 10.9 mV
Lead Channel Setting Pacing Amplitude: 2.5 V
Lead Channel Setting Pacing Pulse Width: 0.5 ms
Lead Channel Setting Sensing Sensitivity: 0.5 mV
Pulse Gen Serial Number: 111028702

## 2023-09-25 NOTE — Progress Notes (Signed)
 Remote ICD transmission.

## 2023-10-05 DIAGNOSIS — F340 Cyclothymic disorder: Secondary | ICD-10-CM | POA: Diagnosis not present

## 2023-10-05 DIAGNOSIS — F902 Attention-deficit hyperactivity disorder, combined type: Secondary | ICD-10-CM | POA: Diagnosis not present

## 2023-10-05 DIAGNOSIS — F411 Generalized anxiety disorder: Secondary | ICD-10-CM | POA: Diagnosis not present

## 2023-11-10 ENCOUNTER — Ambulatory Visit: Payer: 59

## 2023-11-10 DIAGNOSIS — I5181 Takotsubo syndrome: Secondary | ICD-10-CM

## 2023-11-11 LAB — CUP PACEART REMOTE DEVICE CHECK
Battery Remaining Longevity: 83 mo
Battery Remaining Percentage: 66 %
Battery Voltage: 2.99 V
Brady Statistic RV Percent Paced: 1 %
Date Time Interrogation Session: 20250729020455
HighPow Impedance: 86 Ohm
Implantable Lead Connection Status: 753985
Implantable Lead Implant Date: 20211102
Implantable Lead Location: 753860
Implantable Pulse Generator Implant Date: 20211102
Lead Channel Impedance Value: 410 Ohm
Lead Channel Pacing Threshold Amplitude: 0.75 V
Lead Channel Pacing Threshold Pulse Width: 0.5 ms
Lead Channel Sensing Intrinsic Amplitude: 10.9 mV
Lead Channel Setting Pacing Amplitude: 2.5 V
Lead Channel Setting Pacing Pulse Width: 0.5 ms
Lead Channel Setting Sensing Sensitivity: 0.5 mV
Pulse Gen Serial Number: 111028702

## 2023-11-13 ENCOUNTER — Ambulatory Visit: Payer: Self-pay | Admitting: Internal Medicine

## 2023-11-27 ENCOUNTER — Other Ambulatory Visit (HOSPITAL_COMMUNITY): Payer: Self-pay | Admitting: Cardiology

## 2023-12-16 NOTE — Progress Notes (Deleted)
 Cardiology Office Note Date:  12/16/2023  Patient ID:  Kristina Nichols, Kristina Nichols March 17, 1966, MRN [id number] PCP:  Patient, No Pcp Per  Cardiologist:  Dr. Rolan Electrophysiologist: Dr. Waddell    Chief Complaint: *** annual device visit  History of Present Illness: Kristina Nichols is a 58 y.o. female with history of anxiety/depression,  cardiac arrest  01/2020 admission, she had been under a lot of stress. Witnessed cardiac arrest at home (CPR 7 min) with refractory vib/ torsades. Had cardioversion at home followed by transport to [facility]. Intubated in ER.   -- Cardiac cath showed normal coronaries. LV gram EF 20% concerning for Takotsubo cardiomyopathy. -- Required multiple cardioversions post cath for recurrent torsades. On amio drip initially but had prolonged QTc so amiodarone  was stopped. Lidocaine  then started. Had recurrent torsades again and received another shock. EP consulted.  St Jude ICD placed. -- Started on mexiletine. Had cMRI prior to d/c which showed interval improvement in LVEF up to 56% suggestive of recovery of Takotsubo cardiomyopathy.  Last saw EP by myself May 2023.  Seeing HF team Last 12/05/22 as an a work-in visit for fluid retention, lasix  pulsed, planned for echo  LVEF remained preserved, 55-60%  This is the last visit with our cards system  TODAY  *** needs gen cards AHF team *** volume *** symptoms, syncope, shocks   Device information Abbott single chamber ICD implanted 02/14/2020 Secondary prevention   Past Medical History:  Diagnosis Date   Anxiety    CHF (congestive heart failure) (HCC)    Depression    Hyperlipidemia    Hypothyroidism    Insomnia    Scoliosis     Past Surgical History:  Procedure Laterality Date   ABDOMINAL HYSTERECTOMY     CARDIAC CATHETERIZATION     CHOLECYSTECTOMY     IABP INSERTION N/A 02/06/2020   Procedure: IABP Insertion;  Surgeon: Rolan Ezra RAMAN, MD;  Location: [facility] INVASIVE CV LAB;  Service: Cardiovascular;   Laterality: N/A;   ICD IMPLANT N/A 02/14/2020   Procedure: ICD IMPLANT;  Surgeon: Waddell Danelle ORN, MD;  Location: [facility] INVASIVE CV LAB;  Service: Cardiovascular;  Laterality: N/A;   LEFT HEART CATH AND CORONARY ANGIOGRAPHY N/A 02/05/2020   Procedure: LEFT HEART CATH AND CORONARY ANGIOGRAPHY;  Surgeon: Verlin Lonni BIRCH, MD;  Location: [facility] INVASIVE CV LAB;  Service: Cardiovascular;  Laterality: N/A;   PARTIAL HYSTERECTOMY     RIGHT HEART CATH N/A 02/06/2020   Procedure: RIGHT HEART CATH;  Surgeon: Rolan Ezra RAMAN, MD;  Location: [facility] INVASIVE CV LAB;  Service: Cardiovascular;  Laterality: N/A;   TEMPORARY PACEMAKER N/A 02/05/2020   Procedure: TEMPORARY PACEMAKER;  Surgeon: Verlin Lonni BIRCH, MD;  Location: [facility] INVASIVE CV LAB;  Service: Cardiovascular;  Laterality: N/A;    Current Outpatient Medications  Medication Sig Dispense Refill   acetaminophen  (TYLENOL ) 500 MG tablet Take 1,000 mg by mouth every 6 (six) hours as needed for moderate pain.     buPROPion  (WELLBUTRIN  XL) 300 MG 24 hr tablet Take 300 mg by mouth in the morning.     carvedilol  (COREG ) 6.25 MG tablet TAKE 1 TABLET BY MOUTH TWICE A DAY WITH FOOD 180 tablet 1   clonazePAM  (KLONOPIN ) 1 MG tablet Take 1 mg by mouth 3 (three) times daily as needed for anxiety.     FARXIGA  10 MG TABS tablet TAKE 1 TABLET BY MOUTH EVERY DAY BEFORE BREAKFAST 90 tablet 3   furosemide  (LASIX ) 20 MG tablet TAKE 1 TABLET (20 MG TOTAL)  BY MOUTH AS DIRECTED. TAKE 1 TABLET FOR 3 DAYS ONLY THEN AS NEEDED FOR WEIGHT GAIN OF 3 LBS IN 24 HOURS OR 5 LBS IN A WEEK 90 tablet 1   lamoTRIgine  (LAMICTAL ) 100 MG tablet Take 100 mg by mouth in the morning.     lamoTRIgine  (LAMICTAL ) 150 MG tablet Take 150 mg by mouth at bedtime.     omeprazole (PRILOSEC OTC) 20 MG tablet Take 20 mg by mouth daily as needed (for heartburn or indigestion).     pregabalin (LYRICA) 100 MG capsule Take 100 mg by mouth daily.     Semaglutide , 2 MG/DOSE, (OZEMPIC , 2 MG/DOSE,) 8 MG/3ML  SOPN Inject 2 mg into the skin once a week. 3 mL 5   spironolactone  (ALDACTONE ) 25 MG tablet TAKE 1 TABLET (25 MG TOTAL) BY MOUTH DAILY. 90 tablet 3   No current facility-administered medications for this visit.    Allergies:   Patient has no known allergies.   Social History:  The patient  reports that she has never smoked. She has never used smokeless tobacco. She reports that she does not currently use alcohol . She reports that she does not use drugs.   Family History:  The patient's family history includes Alzheimer's disease in her father; Chronic granulomatous disease in her son.  ROS:  Please see the history of present illness.    All other systems are reviewed and otherwise negative.   PHYSICAL EXAM:  VS:  There were no vitals taken for this visit. BMI: There is no height or weight on file to calculate BMI. Well nourished, well developed, in no acute distress HEENT: normocephalic, atraumatic Neck: no JVD, carotid bruits or masses Cardiac: *** RRR; no significant murmurs, no rubs, or gallops Lungs: *** CTA b/l, no wheezing, rhonchi or rales Abd: soft, nontender MS: no deformity or atrophy Ext: *** no edema Skin: warm and dry, no rash Neuro:  No gross deficits appreciated Psych: euthymic mood, full affect  ICD site is stable, no tethering or discomfort   EKG:  not done today  Device interrogation done today and reviewed by myself:  *** Battery and lead measurements are good *** No arrhythmias  12/17/22: TTE  1. Left ventricular ejection fraction, by estimation, is 55 to 60%. The  left ventricle has normal function. The left ventricle has no regional  wall motion abnormalities. Left ventricular diastolic parameters were  normal.   2. Right ventricular systolic function is normal. The right ventricular  size is normal. There is normal pulmonary artery systolic pressure.   3. The mitral valve is normal in structure. Trivial mitral valve  regurgitation. No evidence of  mitral stenosis.   4. The aortic valve is tricuspid. Aortic valve regurgitation is trivial.  Aortic valve sclerosis/calcification is present, without any evidence of  aortic stenosis.   5. The inferior vena cava is normal in size with greater than 50%  respiratory variability, suggesting right atrial pressure of 3 mmHg.     Recent Labs: No results found for requested labs within last 365 days.  No results found for requested labs within last 365 days.   CrCl cannot be calculated (Patient's most recent lab result is older than the maximum 21 days allowed.).   Wt Readings from Last 3 Encounters:  12/05/22 163 lb 9.6 oz (74.2 kg)  01/28/22 162 lb 3.2 oz (73.6 kg)  10/07/21 176 lb 12.9 oz (80.2 kg)     Other studies reviewed: Additional studies/records reviewed today include: summarized above  ASSESSMENT AND PLAN:  ICD *** Intact function *** Programming as abbove  Cardiac arrest Takotsubo-type CM No CAD Recovered LVEF *** *** Follows closely with the HF team   Disposition: *** F/u with remotes as usual, in clinic in 1 year sooner if needed  Current medicines are reviewed at length with the patient today.  The patient did not have any concerns regarding medicines.  Bonney Charlies Arthur, PA-C 12/16/2023 12:49 PM     [facility] [address] [place] [address] [phone] (office)  [phone] (fax)

## 2023-12-17 ENCOUNTER — Ambulatory Visit: Admitting: Physician Assistant

## 2024-01-04 NOTE — Progress Notes (Unsigned)
 Cardiology Office Note Date:  01/04/2024  Patient ID:  Kristina, Nichols 1965-12-29, MRN [id number] PCP:  Patient, No Pcp Per  Cardiologist:  Dr. Rolan Electrophysiologist: Dr. Waddell    Chief Complaint: *** annual device visit  History of Present Illness: Kristina Nichols is a 58 y.o. female with history of anxiety/depression,  cardiac arrest  01/2020 admission, she had been under a lot of stress. Witnessed cardiac arrest at home (CPR 7 min) with refractory vib/ torsades. Had cardioversion at home followed by transport to [facility]. Intubated in ER.   -- Cardiac cath showed normal coronaries. LV gram EF 20% concerning for Takotsubo cardiomyopathy. -- Required multiple cardioversions post cath for recurrent torsades. On amio drip initially but had prolonged QTc so amiodarone  was stopped. Lidocaine  then started. Had recurrent torsades again and received another shock. EP consulted.  St Jude ICD placed. -- Started on mexiletine. Had cMRI prior to d/c which showed interval improvement in LVEF up to 56% suggestive of recovery of Takotsubo cardiomyopathy.  Last saw EP by myself May 2023.  Seeing HF team Last 12/05/22 as an a work-in visit for fluid retention, lasix  pulsed, planned for echo  LVEF remained preserved, 55-60%  This is the last visit with our cards system  TODAY  *** needs gen cards AHF team *** volume *** symptoms, syncope, shocks   Device information Abbott single chamber ICD implanted 02/14/2020 Secondary prevention   Past Medical History:  Diagnosis Date   Anxiety    CHF (congestive heart failure) (HCC)    Depression    Hyperlipidemia    Hypothyroidism    Insomnia    Scoliosis     Past Surgical History:  Procedure Laterality Date   ABDOMINAL HYSTERECTOMY     CARDIAC CATHETERIZATION     CHOLECYSTECTOMY     IABP INSERTION N/A 02/06/2020   Procedure: IABP Insertion;  Surgeon: Rolan Ezra RAMAN, MD;  Location: [facility] INVASIVE CV LAB;  Service:  Cardiovascular;  Laterality: N/A;   ICD IMPLANT N/A 02/14/2020   Procedure: ICD IMPLANT;  Surgeon: Waddell Danelle ORN, MD;  Location: [facility] INVASIVE CV LAB;  Service: Cardiovascular;  Laterality: N/A;   LEFT HEART CATH AND CORONARY ANGIOGRAPHY N/A 02/05/2020   Procedure: LEFT HEART CATH AND CORONARY ANGIOGRAPHY;  Surgeon: Verlin Lonni BIRCH, MD;  Location: [facility] INVASIVE CV LAB;  Service: Cardiovascular;  Laterality: N/A;   PARTIAL HYSTERECTOMY     RIGHT HEART CATH N/A 02/06/2020   Procedure: RIGHT HEART CATH;  Surgeon: Rolan Ezra RAMAN, MD;  Location: [facility] INVASIVE CV LAB;  Service: Cardiovascular;  Laterality: N/A;   TEMPORARY PACEMAKER N/A 02/05/2020   Procedure: TEMPORARY PACEMAKER;  Surgeon: Verlin Lonni BIRCH, MD;  Location: [facility] INVASIVE CV LAB;  Service: Cardiovascular;  Laterality: N/A;    Current Outpatient Medications  Medication Sig Dispense Refill   acetaminophen  (TYLENOL ) 500 MG tablet Take 1,000 mg by mouth every 6 (six) hours as needed for moderate pain.     buPROPion  (WELLBUTRIN  XL) 300 MG 24 hr tablet Take 300 mg by mouth in the morning.     carvedilol  (COREG ) 6.25 MG tablet TAKE 1 TABLET BY MOUTH TWICE A DAY WITH FOOD 180 tablet 1   clonazePAM  (KLONOPIN ) 1 MG tablet Take 1 mg by mouth 3 (three) times daily as needed for anxiety.     FARXIGA  10 MG TABS tablet TAKE 1 TABLET BY MOUTH EVERY DAY BEFORE BREAKFAST 90 tablet 3   furosemide  (LASIX ) 20 MG tablet TAKE 1 TABLET (20 MG TOTAL)  BY MOUTH AS DIRECTED. TAKE 1 TABLET FOR 3 DAYS ONLY THEN AS NEEDED FOR WEIGHT GAIN OF 3 LBS IN 24 HOURS OR 5 LBS IN A WEEK 90 tablet 1   lamoTRIgine  (LAMICTAL ) 100 MG tablet Take 100 mg by mouth in the morning.     lamoTRIgine  (LAMICTAL ) 150 MG tablet Take 150 mg by mouth at bedtime.     omeprazole (PRILOSEC OTC) 20 MG tablet Take 20 mg by mouth daily as needed (for heartburn or indigestion).     pregabalin (LYRICA) 100 MG capsule Take 100 mg by mouth daily.     Semaglutide , 2 MG/DOSE, (OZEMPIC , 2  MG/DOSE,) 8 MG/3ML SOPN Inject 2 mg into the skin once a week. 3 mL 5   spironolactone  (ALDACTONE ) 25 MG tablet TAKE 1 TABLET (25 MG TOTAL) BY MOUTH DAILY. 90 tablet 3   No current facility-administered medications for this visit.    Allergies:   Patient has no known allergies.   Social History:  The patient  reports that she has never smoked. She has never used smokeless tobacco. She reports that she does not currently use alcohol . She reports that she does not use drugs.   Family History:  The patient's family history includes Alzheimer's disease in her father; Chronic granulomatous disease in her son.  ROS:  Please see the history of present illness.    All other systems are reviewed and otherwise negative.   PHYSICAL EXAM:  VS:  There were no vitals taken for this visit. BMI: There is no height or weight on file to calculate BMI. Well nourished, well developed, in no acute distress HEENT: normocephalic, atraumatic Neck: no JVD, carotid bruits or masses Cardiac: *** RRR; no significant murmurs, no rubs, or gallops Lungs: *** CTA b/l, no wheezing, rhonchi or rales Abd: soft, nontender MS: no deformity or atrophy Ext: *** no edema Skin: warm and dry, no rash Neuro:  No gross deficits appreciated Psych: euthymic mood, full affect  ICD site is stable, no tethering or discomfort   EKG:  not done today  Device interrogation done today and reviewed by myself:  *** Battery and lead measurements are good *** No arrhythmias  12/17/22: TTE  1. Left ventricular ejection fraction, by estimation, is 55 to 60%. The  left ventricle has normal function. The left ventricle has no regional  wall motion abnormalities. Left ventricular diastolic parameters were  normal.   2. Right ventricular systolic function is normal. The right ventricular  size is normal. There is normal pulmonary artery systolic pressure.   3. The mitral valve is normal in structure. Trivial mitral valve   regurgitation. No evidence of mitral stenosis.   4. The aortic valve is tricuspid. Aortic valve regurgitation is trivial.  Aortic valve sclerosis/calcification is present, without any evidence of  aortic stenosis.   5. The inferior vena cava is normal in size with greater than 50%  respiratory variability, suggesting right atrial pressure of 3 mmHg.     Recent Labs: No results found for requested labs within last 365 days.  No results found for requested labs within last 365 days.   CrCl cannot be calculated (Patient's most recent lab result is older than the maximum 21 days allowed.).   Wt Readings from Last 3 Encounters:  12/05/22 163 lb 9.6 oz (74.2 kg)  01/28/22 162 lb 3.2 oz (73.6 kg)  10/07/21 176 lb 12.9 oz (80.2 kg)     Other studies reviewed: Additional studies/records reviewed today include: summarized above  ASSESSMENT AND PLAN:  ICD *** Intact function *** Programming as abbove  Cardiac arrest Takotsubo-type CM No CAD Recovered LVEF 2024 *** Follows closely with the HF team   Disposition: *** F/u with remotes as usual, in clinic in 1 year sooner if needed  Current medicines are reviewed at length with the patient today.  The patient did not have any concerns regarding medicines.  Bonney Charlies Arthur, PA-C 01/04/2024 5:18 AM     [facility] [address] [place] [address] [phone] (office)  [phone] (fax)

## 2024-01-05 ENCOUNTER — Encounter: Payer: Self-pay | Admitting: Physician Assistant

## 2024-01-05 ENCOUNTER — Ambulatory Visit: Attending: Physician Assistant | Admitting: Physician Assistant

## 2024-01-05 VITALS — BP 102/70 | HR 72 | Ht 63.0 in | Wt 172.8 lb

## 2024-01-05 DIAGNOSIS — I5022 Chronic systolic (congestive) heart failure: Secondary | ICD-10-CM

## 2024-01-05 DIAGNOSIS — I5181 Takotsubo syndrome: Secondary | ICD-10-CM

## 2024-01-05 DIAGNOSIS — I469 Cardiac arrest, cause unspecified: Secondary | ICD-10-CM | POA: Diagnosis not present

## 2024-01-05 DIAGNOSIS — Z95810 Presence of automatic (implantable) cardiac defibrillator: Secondary | ICD-10-CM

## 2024-01-05 LAB — CUP PACEART INCLINIC DEVICE CHECK
Battery Remaining Longevity: 84 mo
Brady Statistic RV Percent Paced: 0.1 %
Date Time Interrogation Session: 20250923120920
HighPow Impedance: 82.125
Implantable Lead Connection Status: 753985
Implantable Lead Implant Date: 20211102
Implantable Lead Location: 753860
Implantable Pulse Generator Implant Date: 20211102
Lead Channel Impedance Value: 425 Ohm
Lead Channel Pacing Threshold Amplitude: 0.75 V
Lead Channel Pacing Threshold Amplitude: 0.75 V
Lead Channel Pacing Threshold Pulse Width: 0.5 ms
Lead Channel Pacing Threshold Pulse Width: 0.5 ms
Lead Channel Sensing Intrinsic Amplitude: 12 mV
Lead Channel Setting Pacing Amplitude: 2.5 V
Lead Channel Setting Pacing Pulse Width: 0.5 ms
Lead Channel Setting Sensing Sensitivity: 0.5 mV
Pulse Gen Serial Number: 111028702

## 2024-01-05 NOTE — Patient Instructions (Signed)
 Medication Instructions:   Your physician recommends that you continue on your current medications as directed. Please refer to the Current Medication list given to you today.   *If you need a refill on your cardiac medications before your next appointment, please call your pharmacy*    Lab Work:  PLEASE GO DOWN STAIRS  [facility]  FIRST FLOOR   ( GET OFF ELEVATORS WALK TOWARDS WAITING AREA LAB LOCATED BY PHARMACY):  BMET  AND CBC TODAY      If you have labs (blood work) drawn today and your tests are completely normal, you will receive your results only by: MyChart Message (if you have MyChart) OR A paper copy in the mail If you have any lab test that is abnormal or we need to change your treatment, we will call you to review the results.    Testing/Procedures:NONE ORDERED  TODAY\     Follow-Up: At [facility], you and your health needs are our priority.  As part of our continuing mission to provide you with exceptional heart care, our providers are all part of one team.  This team includes your primary Cardiologist (physician) and Advanced Practice Providers or APPs (Physician Assistants and Nurse Practitioners) who all work together to provide you with the care you need, when you need it.  Your next appointment:    DR MCLEAN/APP   NEXT AVAILABLE   AND   1 year(s)  Provider: Danelle Birmingham, MD or Charlies Arthur, PA-C       We recommend signing up for the patient portal called MyChart.  Sign up information is provided on this After Visit Summary.  MyChart is used to connect with patients for Virtual Visits (Telemedicine).  Patients are able to view lab/test results, encounter notes, upcoming appointments, etc.  Non-urgent messages can be sent to your provider as well.   To learn more about what you can do with MyChart, go to [email or web address].   Other Instructions

## 2024-01-06 ENCOUNTER — Ambulatory Visit: Payer: Self-pay | Admitting: Physician Assistant

## 2024-01-06 LAB — BASIC METABOLIC PANEL WITH GFR
BUN/Creatinine Ratio: 20 (ref 9–23)
BUN: 18 mg/dL (ref 6–24)
CO2: 25 mmol/L (ref 20–29)
Calcium: 9.7 mg/dL (ref 8.7–10.2)
Chloride: 98 mmol/L (ref 96–106)
Creatinine, Ser: 0.89 mg/dL (ref 0.57–1.00)
Glucose: 84 mg/dL (ref 70–99)
Potassium: 4.9 mmol/L (ref 3.5–5.2)
Sodium: 138 mmol/L (ref 134–144)
eGFR: 77 mL/min/1.73 (ref >59)

## 2024-01-06 LAB — CBC
Hematocrit: 41.7 % (ref 34.0–46.6)
Hemoglobin: 13.9 g/dL (ref 11.1–15.9)
MCH: 33.2 pg — ABNORMAL HIGH (ref 26.6–33.0)
MCHC: 33.3 g/dL (ref 31.5–35.7)
MCV: 100 fL — ABNORMAL HIGH (ref 79–97)
Platelets: 257 x10E3/uL (ref 150–450)
RBC: 4.19 x10E6/uL (ref 3.77–5.28)
RDW: 12.5 % (ref 11.7–15.4)
WBC: 5.2 x10E3/uL (ref 3.4–10.8)

## 2024-01-13 NOTE — Progress Notes (Signed)
 Remote ICD Transmission

## 2024-01-20 ENCOUNTER — Ambulatory Visit (HOSPITAL_COMMUNITY): Payer: Self-pay | Admitting: Cardiology

## 2024-01-20 ENCOUNTER — Other Ambulatory Visit (HOSPITAL_COMMUNITY): Payer: Self-pay | Admitting: Cardiology

## 2024-01-20 ENCOUNTER — Ambulatory Visit (HOSPITAL_COMMUNITY)
Admission: RE | Admit: 2024-01-20 | Discharge: 2024-01-20 | Disposition: A | Discharge status: Home / Self Care | Source: Ambulatory Visit | Attending: Cardiology | Admitting: Cardiology

## 2024-01-20 VITALS — BP 100/68 | HR 64 | Wt 170.0 lb

## 2024-01-20 DIAGNOSIS — E669 Obesity, unspecified: Secondary | ICD-10-CM | POA: Diagnosis not present

## 2024-01-20 DIAGNOSIS — E785 Hyperlipidemia, unspecified: Secondary | ICD-10-CM | POA: Insufficient documentation

## 2024-01-20 DIAGNOSIS — R9431 Abnormal electrocardiogram [ECG] [EKG]: Secondary | ICD-10-CM | POA: Diagnosis not present

## 2024-01-20 DIAGNOSIS — I5022 Chronic systolic (congestive) heart failure: Secondary | ICD-10-CM

## 2024-01-20 DIAGNOSIS — Z7984 Long term (current) use of oral hypoglycemic drugs: Secondary | ICD-10-CM | POA: Diagnosis not present

## 2024-01-20 DIAGNOSIS — I5181 Takotsubo syndrome: Secondary | ICD-10-CM | POA: Insufficient documentation

## 2024-01-20 DIAGNOSIS — F411 Generalized anxiety disorder: Secondary | ICD-10-CM | POA: Insufficient documentation

## 2024-01-20 DIAGNOSIS — E66811 Obesity, class 1: Secondary | ICD-10-CM | POA: Diagnosis not present

## 2024-01-20 DIAGNOSIS — I4721 Torsades de pointes: Secondary | ICD-10-CM | POA: Diagnosis not present

## 2024-01-20 DIAGNOSIS — Z8674 Personal history of sudden cardiac arrest: Secondary | ICD-10-CM | POA: Insufficient documentation

## 2024-01-20 DIAGNOSIS — Z95810 Presence of automatic (implantable) cardiac defibrillator: Secondary | ICD-10-CM | POA: Insufficient documentation

## 2024-01-20 DIAGNOSIS — Z79899 Other long term (current) drug therapy: Secondary | ICD-10-CM | POA: Insufficient documentation

## 2024-01-20 LAB — BASIC METABOLIC PANEL WITH GFR
Anion gap: 11 (ref 5–15)
BUN: 17 mg/dL (ref 6–20)
CO2: 24 mmol/L (ref 22–32)
Calcium: 9.2 mg/dL (ref 8.9–10.3)
Chloride: 101 mmol/L (ref 98–111)
Creatinine, Ser: 0.92 mg/dL (ref 0.44–1.00)
GFR, Estimated: >60 mL/min (ref >60)
Glucose, Bld: 91 mg/dL (ref 70–99)
Potassium: 4.4 mmol/L (ref 3.5–5.1)
Sodium: 136 mmol/L (ref 135–145)

## 2024-01-20 LAB — LIPID PANEL
Cholesterol: 235 mg/dL — ABNORMAL HIGH (ref 0–200)
HDL: 71 mg/dL (ref >40)
LDL Cholesterol: 148 mg/dL — ABNORMAL HIGH (ref 0–99)
Total CHOL/HDL Ratio: 3.3 ratio
Triglycerides: 79 mg/dL (ref <150)
VLDL: 16 mg/dL (ref 0–40)

## 2024-01-20 LAB — BRAIN NATRIURETIC PEPTIDE: B Natriuretic Peptide: 16.9 pg/mL (ref 0.0–100.0)

## 2024-01-20 NOTE — Progress Notes (Signed)
 Advanced Heart Failure Clinic Note   PCP: Patient, No Pcp Per Cardiology: Dr. Rolan    Chief complaint: h/o Takotsubo cardiomyopathy  HPI:  Kristina Nichols is a 58 y.o. with h/o depression, witnessed out of hospital cardiac arrest 10/21 (received 7 minutes of CPR) at home with refractory vib/ torsades.   Prior to her 10/21 admission, she had been under a lot of stress. Witnessed cardiac arrest at home (CPR 7 min) with refractory vib/ torsades. Had cardioversion at home followed by transport to [facility]. Intubated in ER.  Cardiac cath showed normal coronaries. LV gram EF 20% concerning for Takotsubo cardiomyopathy. Required multiple cardioversions post cath for recurrent torsades. On amio drip initially but had prolonged QTc so amiodarone  was stopped. Lidocaine  then started. Had recurrent torsades again and received another shock. EP consulted.  St Jude ICD placed. Placed on GDMT for systolic heart failure. Mexiletine added for antiarrhymic therapy. Had cMRI prior to d/c which showed interval improvement in LVEF up to 56% suggestive of recovery of Takotsubo cardiomyopathy.  Echo in 2/22 showed EF 50-55%, normal RV.  Echo in 3/23 showed EF 55-60%, normal RV.  Echo in 9/24 showed EF 55-60%, normal RV.   She seems to be doing well from a cardiac standpoint but still has a lot of generalized anxiety.  No exertional dyspnea or chest pain.  No palpitations or lightheadedness.  BP stable.  Weight is up 7 lbs.   St Jude device interrogation: No VT/VF, stable thoracic impedance.   ECG (personally reviewed): NSR, septal Qs  Labs (10/23): LDL 149 Labs (9/25): K 4.9, creatinine 0.89  PMH: 1. Anxiety.  2. Scoliosis 3. GERD 4. Takotsubo cardiomyopathy: Occurred in setting of severe emotional stress in 10/21.  - Cath in 10/21 with normal coronaries, EF 20% on LV-gram.  - Echo (10/21) with EF < 20%, global HK, normal RV.  - Cardiac MRI (11/21): EF 56%, RVEF 47%, no LGE.  - Echo (2/22): EF 50-55%, normal  diastolic function, normal RV.  - Echo (3/23): EF 55-60%, normal RV. - Echo (9/24): EF 55-60%, normal RV 5. VT: Multiple episodes of torsades de pointes in setting of Takotsubo cardiomyopathy.  - St Jude ICD placed.  6. Hyperlipidemia  Review of systems complete and found to be negative unless listed in HPI.     Current Outpatient Medications  Medication Sig Dispense Refill   acetaminophen  (TYLENOL ) 500 MG tablet Take 1,000 mg by mouth every 6 (six) hours as needed for moderate pain.     amphetamine-dextroamphetamine (ADDERALL XR) 30 MG 24 hr capsule Take 30 mg by mouth every morning.     buPROPion  (WELLBUTRIN  XL) 300 MG 24 hr tablet Take 300 mg by mouth in the morning.     clonazePAM  (KLONOPIN ) 1 MG tablet Take 1 mg by mouth 3 (three) times daily as needed for anxiety.     Cyanocobalamin (VITAMIN B 12 PO) Take 1 tablet by mouth daily.     FARXIGA  10 MG TABS tablet TAKE 1 TABLET BY MOUTH EVERY DAY BEFORE BREAKFAST 90 tablet 3   furosemide  (LASIX ) 20 MG tablet TAKE 1 TABLET (20 MG TOTAL) BY MOUTH AS DIRECTED. TAKE 1 TABLET FOR 3 DAYS ONLY THEN AS NEEDED FOR WEIGHT GAIN OF 3 LBS IN 24 HOURS OR 5 LBS IN A WEEK 90 tablet 1   lamoTRIgine  (LAMICTAL ) 100 MG tablet Take 100 mg by mouth in the morning.     lamoTRIgine  (LAMICTAL ) 150 MG tablet Take 150 mg by mouth at bedtime.  omeprazole (PRILOSEC OTC) 20 MG tablet Take 20 mg by mouth daily as needed (for heartburn or indigestion).     spironolactone  (ALDACTONE ) 25 MG tablet TAKE 1 TABLET (25 MG TOTAL) BY MOUTH DAILY. 90 tablet 3   carvedilol  (COREG ) 6.25 MG tablet TAKE 1 TABLET BY MOUTH TWICE A DAY WITH FOOD 180 tablet 1   pregabalin (LYRICA) 100 MG capsule Take 100 mg by mouth daily. (Patient not taking: Reported on 01/20/2024)     Semaglutide , 2 MG/DOSE, (OZEMPIC , 2 MG/DOSE,) 8 MG/3ML SOPN Inject 2 mg into the skin once a week. (Patient not taking: Reported on 01/20/2024) 3 mL 5   No current facility-administered medications for this encounter.     No Known Allergies    Social History   Socioeconomic History   Marital status: Married    Spouse name: [name]   Number of children: 1   Years of education: 12   Highest education level: Not on file  Occupational History   Not on file  Tobacco Use   Smoking status: Never   Smokeless tobacco: Never  Vaping Use   Vaping status: Never Used  Substance and Sexual Activity   Alcohol  use: Not Currently   Drug use: Never   Sexual activity: Yes  Other Topics Concern   Not on file  Social History Narrative   Not on file   Social Drivers of Health   Financial Resource Strain: Not on file  Food Insecurity: Not on file  Transportation Needs: Not on file  Physical Activity: Not on file  Stress: Not on file  Social Connections: Not on file  Intimate Partner Violence: Not on file      Family History  Problem Relation Age of Onset   Alzheimer's disease Father    Chronic granulomatous disease Son     Vitals:   01/20/24 0914  BP: 100/68  Pulse: 64  SpO2: 98%  Weight: 77.1 kg (170 lb)   Wt Readings from Last 3 Encounters:  01/20/24 77.1 kg (170 lb)  01/05/24 78.4 kg (172 lb 12.8 oz)  12/05/22 74.2 kg (163 lb 9.6 oz)    PHYSICAL EXAM: General: NAD Neck: No JVD, no thyromegaly or thyroid nodule.  Lungs: Clear to auscultation bilaterally with normal respiratory effort. CV: Nondisplaced PMI.  Heart regular S1/S2, no S3/S4, no murmur.  No peripheral edema.  No carotid bruit.  Normal pedal pulses.  Abdomen: Soft, nontender, no hepatosplenomegaly, no distention.  Skin: Intact without lesions or rashes.  Neurologic: Alert and oriented x 3.  Psych: Anxious Extremities: No clubbing or cyanosis.  HEENT: Normal.   ASSESSMENT & PLAN:  1. VT: Had out of hospital cardiac arrest 10/21 due to recurrent torsades with multiple shocks.  Coronaries normal in 10/21, EF <20% on echo.  Amiodarone  stopped due to prolonged QT interval. Suspect that prolonged QT was the sequelae of  stress (Takotsubo-type) cardiomyopathy. S/p St Jude ICD.  Cardiac MRI in 11/21 showed no delayed enhancement.  Echo in 2/22 with EF improved to 50-55%.   Echo in 3/23 and again in 9/24 with EF 55-60%, normal RV.  No arrhythmias on device interrogation today.  - Continue Coreg  6.25 mg bid  2. Takotsubo cardiomyopathy: Echo 10/21 w/  EF < 20%  (no LV thrombus), cath in 10/21 with normal coronaries.  Suspect stress (Takotsubo-type) cardiomyopathy. Recent emotional stress. Cardiac MRI 11/21 showed LV EF back up to normal range (EF 56%) with no delayed enhancement => this is supportive of stress cardiomyopathy.  Echo in 2/22 showed EF 50-55%.  Echo in 3/23 and again in 9/24 with EF 55-60%, normal RV. NYHA class I, not volume overloaded on exam.  - Off losartan  with recovered EF and low BP.  - Continue current doses of spironolactone  and Coreg  - Continue Farxiga  10 mg daily.  - Check BMET today.  3. Psych: Significant anxiety at baseline.  - She is seeing a psychiatrist 4. Obesity: She wants to restart semaglutide , I will refer her to pharmacy clinic for this.  5. Hyperlipidemia: LDL was elevated in 2023, will recheck today.  - I will arrange for coronary artery calcium  score to risk stratify and decide on strategy to manage her hyperlipidemia.   Followup 1 year  I spent 31 minutes reviewing records, interviewing/examining patient, and managing orders    Ezra Shuck, MD 01/20/24

## 2024-01-20 NOTE — Patient Instructions (Signed)
 There has been no changes to your medications.  Labs done today, your results will be available in MyChart, we will contact you for abnormal readings.  CT Coronary Calcium  Score:  Your physician has recommended that you have CT Coronary Calcium  Score.  - $99 out of pocket cost at the time of your test  You have been [facility]. They will call you to arrange your appointment.  Your physician recommends that you schedule a follow-up appointment in: 1 year ( October 2026) ** PLEASE CALL THE OFFICE IN AUGUST 2026 TO ARRANGE YOUR FOLLOW UP APPOINTMENT.**  If you have any questions or concerns before your next appointment please send us  a message through [place] or call our office at [phone].    TO LEAVE A MESSAGE FOR THE NURSE SELECT OPTION 2, PLEASE LEAVE A MESSAGE INCLUDING: YOUR NAME DATE OF BIRTH CALL BACK NUMBER REASON FOR CALL**this is important as we prioritize the call backs  YOU WILL RECEIVE A CALL BACK THE SAME DAY AS LONG AS YOU CALL BEFORE 4:00 PM  At the [facility], you and your health needs are our priority. As part of our continuing mission to provide you with exceptional heart care, we have created designated Provider Care Teams. These Care Teams include your primary Cardiologist (physician) and Advanced Practice Providers (APPs- Physician Assistants and Nurse Practitioners) who all work together to provide you with the care you need, when you need it.   You may see any of the following providers on your designated Care Team at your next follow up: Dr Toribio Fuel Dr Ezra Shuck Dr. Ria Commander Dr. Morene Brownie Amy Lenetta, NP Caffie Shed, [place] [facility] [place], [place] Beckey Coe, NP [place] Lee, NP Ellouise Class, NP Tinnie Redman, PharmD Jaun Bash, PharmD   Please be sure to bring in all your medications bottles to every appointment.    Thank you for choosing [facility]

## 2024-01-29 ENCOUNTER — Ambulatory Visit (HOSPITAL_BASED_OUTPATIENT_CLINIC_OR_DEPARTMENT_OTHER)
Admission: RE | Admit: 2024-01-29 | Discharge: 2024-01-29 | Disposition: A | Payer: Self-pay | Source: Ambulatory Visit | Attending: Cardiology | Admitting: Cardiology

## 2024-01-29 DIAGNOSIS — I5022 Chronic systolic (congestive) heart failure: Secondary | ICD-10-CM | POA: Insufficient documentation

## 2024-02-09 ENCOUNTER — Ambulatory Visit

## 2024-02-09 ENCOUNTER — Ambulatory Visit (INDEPENDENT_AMBULATORY_CARE_PROVIDER_SITE_OTHER): Payer: 59

## 2024-02-09 DIAGNOSIS — I5022 Chronic systolic (congestive) heart failure: Secondary | ICD-10-CM

## 2024-02-09 LAB — CUP PACEART REMOTE DEVICE CHECK
Battery Remaining Longevity: 81 mo
Battery Remaining Percentage: 65 %
Battery Voltage: 2.99 V
Brady Statistic RV Percent Paced: 1 %
Date Time Interrogation Session: 20251028020428
HighPow Impedance: 89 Ohm
Implantable Lead Connection Status: 753985
Implantable Lead Implant Date: 20211102
Implantable Lead Location: 753860
Implantable Pulse Generator Implant Date: 20211102
Lead Channel Impedance Value: 400 Ohm
Lead Channel Pacing Threshold Amplitude: 0.75 V
Lead Channel Pacing Threshold Pulse Width: 0.5 ms
Lead Channel Sensing Intrinsic Amplitude: 11.6 mV
Lead Channel Setting Pacing Amplitude: 2.5 V
Lead Channel Setting Pacing Pulse Width: 0.5 ms
Lead Channel Setting Sensing Sensitivity: 0.5 mV
Pulse Gen Serial Number: 111028702

## 2024-02-09 NOTE — Progress Notes (Deleted)
 Patient ID: Kristina Nichols                 DOB: [birth year]                    MRN: [id number]     HPI: Kristina Nichols is a 58 y.o. female patient referred to pharmacy clinic by Dr. Rolan to initiate GLP1-RA therapy. PMH is significant for CHF, HLD, anxiety, depression, hypothyroidism and insomnia, and obesity. Most recent BMI *** kg/m .  Current weight and BMI: *** kg/m  Goal weight:  Current meds that affect weight: carvedilol , lamotrigine , Farxiga    Diet:  Breakfast: Lunch/Dinner: Snacks: Beverages:  Exercise:   Family History:   Relation Problem Comments  Mother (Deceased)   Father (Deceased) Alzheimer's disease     Son Metallurgist) Chronic granulomatous disease       Social History: Alcohol : Smoking:   Labs: Lab Results  Component Value Date   HGBA1C 5.3 02/06/2020    Wt Readings from Last 1 Encounters:  01/20/24 170 lb (77.1 kg)    BP Readings from Last 1 Encounters:  01/20/24 100/68   Pulse Readings from Last 1 Encounters:  01/20/24 64       Component Value Date/Time   CHOL 235 (H) 01/20/2024 0937   TRIG 79 01/20/2024 0937   HDL 71 01/20/2024 0937   CHOLHDL 3.3 01/20/2024 0937   VLDL 16 01/20/2024 0937   LDLCALC 148 (H) 01/20/2024 0937    Past Medical History:  Diagnosis Date   Anxiety    CHF (congestive heart failure) (HCC)    Depression    Hyperlipidemia    Hypothyroidism    Insomnia    Scoliosis     Current Outpatient Medications on File Prior to Visit  Medication Sig Dispense Refill   acetaminophen  (TYLENOL ) 500 MG tablet Take 1,000 mg by mouth every 6 (six) hours as needed for moderate pain.     amphetamine-dextroamphetamine (ADDERALL XR) 30 MG 24 hr capsule Take 30 mg by mouth every morning.     buPROPion  (WELLBUTRIN  XL) 300 MG 24 hr tablet Take 300 mg by mouth in the morning.     carvedilol  (COREG ) 6.25 MG tablet TAKE 1 TABLET BY MOUTH TWICE A DAY WITH FOOD 180 tablet 1   clonazePAM  (KLONOPIN ) 1 MG tablet Take 1 mg by mouth 3  (three) times daily as needed for anxiety.     Cyanocobalamin (VITAMIN B 12 PO) Take 1 tablet by mouth daily.     FARXIGA  10 MG TABS tablet TAKE 1 TABLET BY MOUTH EVERY DAY BEFORE BREAKFAST 90 tablet 3   furosemide  (LASIX ) 20 MG tablet TAKE 1 TABLET (20 MG TOTAL) BY MOUTH AS DIRECTED. TAKE 1 TABLET FOR 3 DAYS ONLY THEN AS NEEDED FOR WEIGHT GAIN OF 3 LBS IN 24 HOURS OR 5 LBS IN A WEEK 90 tablet 1   lamoTRIgine  (LAMICTAL ) 100 MG tablet Take 100 mg by mouth in the morning.     lamoTRIgine  (LAMICTAL ) 150 MG tablet Take 150 mg by mouth at bedtime.     omeprazole (PRILOSEC OTC) 20 MG tablet Take 20 mg by mouth daily as needed (for heartburn or indigestion).     pregabalin (LYRICA) 100 MG capsule Take 100 mg by mouth daily. (Patient not taking: Reported on 01/20/2024)     Semaglutide , 2 MG/DOSE, (OZEMPIC , 2 MG/DOSE,) 8 MG/3ML SOPN Inject 2 mg into the skin once a week. (Patient not taking: Reported on 01/20/2024) 3 mL 5   spironolactone  (  ALDACTONE ) 25 MG tablet TAKE 1 TABLET (25 MG TOTAL) BY MOUTH DAILY. 90 tablet 3   No current facility-administered medications on file prior to visit.    No Known Allergies   Assessment/Plan:  1. Weight loss - Patient has not met goal of at least 5% of body weight loss with comprehensive lifestyle modifications alone in the past 3-6 months. Pharmacotherapy is appropriate to pursue as augmentation. Will start*** . Confirmed patient not ***pregnant and no personal or family history of medullary thyroid carcinoma (MTC) or Multiple Endocrine Neoplasia syndrome type 2 (MEN 2). Injection technique reviewed at today's visit.  Advised patient on common side effects including nausea, diarrhea, dyspepsia, decreased appetite, and fatigue. Counseled patient on reducing meal size and how to titrate medication to minimize side effects. Counseled patient to call if intolerable side effects or if experiencing dehydration, abdominal pain, or dizziness. Along with pharmacotherapy, the  patient will follow dietary modifications and aim for at least 150 minutes of moderate-intensity exercise per week, plus resistance training twice a week (as recommended by the American Heart Association). This resistance training--such as weightlifting, bodyweight exercises, or using resistance bands, adapted to the patient's ability--will help prevent muscle loss.  Follow up in 1-2 days regarding coverage of *** . If therapy is initiated, phone follow-ups will be conducted every 4 weeks for dose titration until the patient reaches the effective therapeutic dose and target weight.  Erick Oxendine E. Lizbet Cirrincione, Pharm.D [place] [facility] [address], [place], [place] [address] Phone: [phone]; Fax: [phone]

## 2024-02-10 NOTE — Progress Notes (Signed)
 Remote ICD Transmission

## 2024-02-11 ENCOUNTER — Ambulatory Visit: Payer: Self-pay | Admitting: Internal Medicine

## 2024-02-23 ENCOUNTER — Other Ambulatory Visit (HOSPITAL_COMMUNITY): Payer: Self-pay | Admitting: Cardiology

## 2024-03-07 DIAGNOSIS — F902 Attention-deficit hyperactivity disorder, combined type: Secondary | ICD-10-CM | POA: Diagnosis not present

## 2024-03-07 DIAGNOSIS — F411 Generalized anxiety disorder: Secondary | ICD-10-CM | POA: Diagnosis not present

## 2024-03-07 DIAGNOSIS — F340 Cyclothymic disorder: Secondary | ICD-10-CM | POA: Diagnosis not present

## 2024-03-09 ENCOUNTER — Ambulatory Visit: Attending: Internal Medicine

## 2024-03-09 VITALS — Ht 63.0 in | Wt 174.6 lb

## 2024-03-09 DIAGNOSIS — E66811 Obesity, class 1: Secondary | ICD-10-CM

## 2024-03-09 MED ORDER — OZEMPIC (0.25 OR 0.5 MG/DOSE) 2 MG/3ML ~~LOC~~ SOPN
0.5000 mg | PEN_INJECTOR | SUBCUTANEOUS | 0 refills | Status: AC
Start: 2024-03-09 — End: ?

## 2024-03-09 NOTE — Progress Notes (Signed)
 Patient ID: Kristina Nichols                 DOB: 06-16-1965                    MRN: [id number]     HPI: Kristina Nichols is a 58 y.o. female patient referred to pharmacy clinic by Dr. Rolan to reinitiate GLP1-RA therapy. PMH is significant for CHF, HLD, anxiety, depression, hypothyroidism and insomnia, and obesity. Most recent BMI 30.94 kg/m.  Patient presents today in good spirits. Ozempic  is listed on her medication profile, but there is no documented diagnosis of diabetes. Chart review indicates that Ozempic  was initiated in 2023. A prior authorization for Wegovy  was denied, as her insurance plan does not cover weight-loss medications. Although she does not have diabetes, Ozempic  is currently available on her formulary without a PA requirement. Aware that this may change in the future, at which point therapy would need to be discontinued due to off-label use.  The patient previously titrated up to the 2 mg dose but reported intolerance, experiencing nausea and vomiting. She has not taken Ozempic  2 mg since August. Documentation shows multiple outreach attempts to refill her prescription but no response.   She states today that she has gained some weight and is wanting to resume Ozempic  but at lower dose as 2 mg was not tolerable.  Current weight and BMI: 174 lbs and 30.93 kg/m  Goal weight: 150 lbs  Current meds that affect weight: carvedilol , lamotrigine , Farxiga , bupropion , furosemide   Diet:  Breakfast: coffee, banana  Lunch/Dinner: baked protein, brocolli, asparagus, collards, red beans and rice. Beverages: mostly water  Exercise: no structured exercise  Family History:   Relation Problem Comments  Mother (Deceased)   Father (Deceased) Alzheimer's disease     Son Metallurgist) Chronic granulomatous disease       Social History: Alcohol : none Smoking: none   Labs: Lab Results  Component Value Date   HGBA1C 5.3 02/06/2020    Wt Readings from Last 1 Encounters:  01/20/24 170 lb  (77.1 kg)    BP Readings from Last 1 Encounters:  01/20/24 100/68   Pulse Readings from Last 1 Encounters:  01/20/24 64       Component Value Date/Time   CHOL 235 (H) 01/20/2024 0937   TRIG 79 01/20/2024 0937   HDL 71 01/20/2024 0937   CHOLHDL 3.3 01/20/2024 0937   VLDL 16 01/20/2024 0937   LDLCALC 148 (H) 01/20/2024 0937    Past Medical History:  Diagnosis Date   Anxiety    CHF (congestive heart failure) (HCC)    Depression    Hyperlipidemia    Hypothyroidism    Insomnia    Scoliosis     Current Outpatient Medications on File Prior to Visit  Medication Sig Dispense Refill   acetaminophen  (TYLENOL ) 500 MG tablet Take 1,000 mg by mouth every 6 (six) hours as needed for moderate pain.     amphetamine-dextroamphetamine (ADDERALL XR) 30 MG 24 hr capsule Take 30 mg by mouth every morning.     buPROPion  (WELLBUTRIN  XL) 300 MG 24 hr tablet Take 300 mg by mouth in the morning.     carvedilol  (COREG ) 6.25 MG tablet TAKE 1 TABLET BY MOUTH TWICE A DAY WITH FOOD 180 tablet 1   clonazePAM  (KLONOPIN ) 1 MG tablet Take 1 mg by mouth 3 (three) times daily as needed for anxiety.     Cyanocobalamin (VITAMIN B 12 PO) Take 1 tablet by mouth daily.  FARXIGA  10 MG TABS tablet TAKE 1 TABLET BY MOUTH EVERY DAY BEFORE BREAKFAST 90 tablet 3   furosemide  (LASIX ) 20 MG tablet TAKE 1 TABLET (20 MG TOTAL) BY MOUTH AS DIRECTED. TAKE 1 TABLET FOR 3 DAYS ONLY THEN AS NEEDED FOR WEIGHT GAIN OF 3 LBS IN 24 HOURS OR 5 LBS IN A WEEK 90 tablet 1   lamoTRIgine  (LAMICTAL ) 100 MG tablet Take 100 mg by mouth in the morning.     lamoTRIgine  (LAMICTAL ) 150 MG tablet Take 150 mg by mouth at bedtime.     omeprazole (PRILOSEC OTC) 20 MG tablet Take 20 mg by mouth daily as needed (for heartburn or indigestion).     pregabalin (LYRICA) 100 MG capsule Take 100 mg by mouth daily. (Patient not taking: Reported on 01/20/2024)     Semaglutide , 2 MG/DOSE, (OZEMPIC , 2 MG/DOSE,) 8 MG/3ML SOPN Inject 2 mg into the skin once a  week. (Patient not taking: Reported on 01/20/2024) 3 mL 5   spironolactone  (ALDACTONE ) 25 MG tablet TAKE 1 TABLET (25 MG TOTAL) BY MOUTH DAILY. 90 tablet 3   No current facility-administered medications on file prior to visit.    No Known Allergies   Assessment/Plan:  1. Weight loss - Pharmacotherapy is appropriate to reinitiate as augmentation. Plan to restart Ozempic  at 0.5 mg once weekly, given the patient has been off therapy for approximately three months and previously did not tolerate the 2 mg dose well.   Confirmed patient not pregnant and no personal or family history of medullary thyroid carcinoma (MTC) or Multiple Endocrine Neoplasia syndrome type 2 (MEN 2). Injection technique reviewed at today's visit.  Advised patient on common side effects including nausea, diarrhea, dyspepsia, decreased appetite, and fatigue. Counseled patient on reducing meal size and how to titrate medication to minimize side effects. Counseled patient to call if intolerable side effects or if experiencing dehydration, abdominal pain, or dizziness. Along with pharmacotherapy, the patient will follow dietary modifications and aim for at least 150 minutes of moderate-intensity exercise per week, plus resistance training twice a week (as recommended by the American Heart Association). This resistance training--such as weightlifting, bodyweight exercises, or using resistance bands, adapted to the patient's ability--will help prevent muscle loss.  Phone follow-ups will be conducted every 4 weeks for dose titration until the patient reaches the effective therapeutic dose and target weight.  Dayanna Pryce E. Marston Mccadden, Pharm.D [place] [facility]. [facility] [address], [place], [place] [address] Phone: [phone]; Fax: [phone]

## 2024-05-10 ENCOUNTER — Ambulatory Visit: Attending: Cardiology

## 2024-05-10 DIAGNOSIS — I428 Other cardiomyopathies: Secondary | ICD-10-CM | POA: Diagnosis not present

## 2024-05-11 LAB — CUP PACEART REMOTE DEVICE CHECK
Battery Remaining Longevity: 79 mo
Battery Remaining Percentage: 63 %
Battery Voltage: 2.99 V
Brady Statistic RV Percent Paced: 1 %
Date Time Interrogation Session: 20260127020350
HighPow Impedance: 88 Ohm
Implantable Lead Connection Status: 753985
Implantable Lead Implant Date: 20211102
Implantable Lead Location: 753860
Implantable Pulse Generator Implant Date: 20211102
Lead Channel Impedance Value: 410 Ohm
Lead Channel Pacing Threshold Amplitude: 0.75 V
Lead Channel Pacing Threshold Pulse Width: 0.5 ms
Lead Channel Sensing Intrinsic Amplitude: 12 mV
Lead Channel Setting Pacing Amplitude: 2.5 V
Lead Channel Setting Pacing Pulse Width: 0.5 ms
Lead Channel Setting Sensing Sensitivity: 0.5 mV
Pulse Gen Serial Number: 111028702

## 2024-05-15 ENCOUNTER — Ambulatory Visit: Payer: Self-pay | Admitting: Cardiology

## 2024-05-18 NOTE — Progress Notes (Signed)
 Remote ICD Transmission

## 2024-08-09 ENCOUNTER — Ambulatory Visit

## 2024-09-14 ENCOUNTER — Ambulatory Visit: Admitting: Dermatology

## 2024-11-08 ENCOUNTER — Ambulatory Visit

## 2025-02-07 ENCOUNTER — Ambulatory Visit

## 2025-05-09 ENCOUNTER — Ambulatory Visit

## 2025-08-08 ENCOUNTER — Ambulatory Visit
